# Patient Record
Sex: Male | Born: 1935 | Race: White | Hispanic: No | Smoking: Former smoker
Health system: Southern US, Community
[De-identification: ages and names within clinical notes are randomized; demographics above are authoritative.]

## PROBLEM LIST (undated history)

## (undated) DIAGNOSIS — J189 Pneumonia, unspecified organism: Secondary | ICD-10-CM

## (undated) DIAGNOSIS — I219 Acute myocardial infarction, unspecified: Secondary | ICD-10-CM

## (undated) DIAGNOSIS — I10 Essential (primary) hypertension: Secondary | ICD-10-CM

## (undated) DIAGNOSIS — K219 Gastro-esophageal reflux disease without esophagitis: Secondary | ICD-10-CM

## (undated) DIAGNOSIS — N189 Chronic kidney disease, unspecified: Secondary | ICD-10-CM

## (undated) DIAGNOSIS — C801 Malignant (primary) neoplasm, unspecified: Secondary | ICD-10-CM

## (undated) DIAGNOSIS — E785 Hyperlipidemia, unspecified: Secondary | ICD-10-CM

## (undated) DIAGNOSIS — I2510 Atherosclerotic heart disease of native coronary artery without angina pectoris: Secondary | ICD-10-CM

## (undated) DIAGNOSIS — Z9289 Personal history of other medical treatment: Secondary | ICD-10-CM

## (undated) DIAGNOSIS — R42 Dizziness and giddiness: Secondary | ICD-10-CM

## (undated) HISTORY — DX: Pneumonia, unspecified organism: J18.9

## (undated) HISTORY — PX: CORONARY STENT PLACEMENT: SHX1402

## (undated) HISTORY — DX: Dizziness and giddiness: R42

## (undated) HISTORY — DX: Personal history of other medical treatment: Z92.89

## (undated) HISTORY — DX: Hyperlipidemia, unspecified: E78.5

## (undated) HISTORY — DX: Malignant (primary) neoplasm, unspecified: C80.1

## (undated) HISTORY — DX: Essential (primary) hypertension: I10

## (undated) HISTORY — DX: Acute myocardial infarction, unspecified: I21.9

## (undated) HISTORY — DX: Gastro-esophageal reflux disease without esophagitis: K21.9

## (undated) HISTORY — DX: Chronic kidney disease, unspecified: N18.9

## (undated) HISTORY — DX: Atherosclerotic heart disease of native coronary artery without angina pectoris: I25.10

## (undated) HISTORY — PX: APPENDECTOMY: SHX54

---

## 2006-07-15 LAB — HM DIABETES EYE EXAM: HM Diabetic Eye Exam: NORMAL

## 2009-02-24 DIAGNOSIS — I219 Acute myocardial infarction, unspecified: Secondary | ICD-10-CM

## 2009-02-24 HISTORY — DX: Acute myocardial infarction, unspecified: I21.9

## 2010-02-19 ENCOUNTER — Ambulatory Visit: Payer: Self-pay | Admitting: Internal Medicine

## 2010-02-19 DIAGNOSIS — J4530 Mild persistent asthma, uncomplicated: Secondary | ICD-10-CM | POA: Insufficient documentation

## 2010-02-23 DIAGNOSIS — K219 Gastro-esophageal reflux disease without esophagitis: Secondary | ICD-10-CM | POA: Insufficient documentation

## 2010-02-23 DIAGNOSIS — I219 Acute myocardial infarction, unspecified: Secondary | ICD-10-CM | POA: Insufficient documentation

## 2010-02-23 DIAGNOSIS — I2510 Atherosclerotic heart disease of native coronary artery without angina pectoris: Secondary | ICD-10-CM | POA: Insufficient documentation

## 2010-03-28 NOTE — Assessment & Plan Note (Signed)
Summary: [name]   Vital Signs:  Patient profile:   77 year old male Weight:      189.50 pounds O2 Sat:      94 % on Room air Pulse rate:   66 / minute BP sitting:   130 / 72  (left arm) Cuff size:   regular  Vitals Entered By: Clayborne Dana CMA 03-09-2010 1:42 PM)  O2 Flow:  Room air   History of Present Illness: February 19, 2010- 81 yoM referred by daughter Manuel [name]- a patient here. He is moving from [place] and finds visiting in her current house, he gets tight in chest, with wheeze and can't sleep. He clears up when he leaves. He has hx of childhood asthma and was allergic to cat then, with seasonal hayfever. He had no problem visiting her in other [place] homes before she settled in her current local home. He will be moving into this home with her when they build an addition. House is not moldy, no animals, mattress ok, furnace 75 years old, hardwood, no carpet. Having little nasal congestion or sneeze. Review of meds negative for obvious problems.    Preventive Screening-Counseling & Management  Alcohol-Tobacco     Smoking Status: quit     Packs/Day: 0.75     Year Started: teenager     Year Quit: 1990  Current Medications (verified): 1)  Aspirin 81 Mg Tbec (Aspirin) .... Take 1 By Mouth Once Daily 2)  Lipitor 20 Mg Tabs (Atorvastatin Calcium) .... Take 1 By Mouth Once Daily 3)  Metoprolol Tartrate 25 Mg Tabs (Metoprolol Tartrate) .... Take 2 By Mouth Once Daily 4)  Nexium 40 Mg Cpdr (Esomeprazole Magnesium) .... Take 1 By Mouth Once Daily 5)  Plavix 75 Mg Tabs (Clopidogrel Bisulfate) .... Take 1 By Mouth Once Daily 6)  Metformin Hcl 500 Mg Tabs (Metformin Hcl) .... Take 2 By Mouth Two Times A Day 7)  Multivitamins  Tabs (Multiple Vitamin) .... Take 1 By Mouth Once Daily 8)  Super B Complex  Tabs (B Complex-C) .... Take 1 By Mouth Once Daily 9)  Vitamin C 500 Mg Tabs (Ascorbic Acid) .... Take 1 By Mouth Once Daily 10)  Vitamin D 400 Unit Tabs (Cholecalciferol)  .... Take 1 By Mouth Once Daily 11)  Vitamin E 400 Unit Caps (Vitamin E) .... Take 1 By Mouth Once Daily 12)  Fish Oil 1000 Mg Caps (Omega-3 Fatty Acids) .... Take 1 By Mouth Once Daily 13)  Calcium 600 Mg Tabs (Calcium) .... 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  Smoking Status:  quit Packs/Day:  0.75  Review of Systems      See HPI       The patient complains of productive cough and chest pain.  The patient denies shortness of breath with  activity, shortness of breath at rest, non-productive cough, coughing up blood, irregular heartbeats, acid heartburn, indigestion, loss of appetite, weight change, abdominal pain, difficulty swallowing, sore throat, tooth/dental problems, headaches, nasal congestion/difficulty breathing through nose, sneezing, itching, ear ache, anxiety, depression, hand/feet swelling, joint stiffness or pain, rash, change in color of mucus, and fever.    Physical Exam  Additional Exam:  General: A/Ox3; pleasant and cooperative, NAD, trim SKIN: no rash, lesions NODES: no lymphadenopathy HEENT: [place]/AT, EOM- WNL, Conjuctivae- clear, PERRLA, TM-WNL, Nose- old nasal fx, Throat- clear and wnl,  own teeth NECK: Supple w/ fair ROM, JVD- none, normal carotid impulses w/o bruits Thyroid- normal to palpation CHEST: Clear to P&A, quiet HEART: RRR, no m/g/r heard ABDOMEN: Soft and nl; nml bowel sounds; no organomegaly or masses noted DTO:IZTI, nl pulses, no edema, cyanosis or clubbing NEURO: Grossly intact to observation      Impression & Recommendations:  Problem # 1:  ASTHMA (ICD-493.90) It seems he is having asthma specifically associated with some aspect of his daughter's house.  He does admit a little wheeze occasionally in [place] and a child hx of episodic asthma. Other than metoprolol as a beta blocker, potentiating meds aren't evident. He feels his GERD is controlled. He doesn't plan permanent move here for another year. We will see if he notes benefit from rescue inhaler and LABA/ICS.  Problem # 2:  CORONARY HEART DISEASE (ICD-414.00) I don't think he is describing "cardiac asthma" but we will watch fluid status. His updated medication list for this problem includes:    Aspirin 81 Mg Tbec (Aspirin) .[name]... Take 1 by mouth once daily    Metoprolol Tartrate 25 Mg Tabs (Metoprolol tartrate) .[name]... Take 2 by mouth once daily    Plavix 75 Mg Tabs (Clopidogrel bisulfate) .[name]... Take 1 by mouth once daily  Problem # 3:  G E R D (ICD-530.81) We have discussed reflux as a respiratory trigger so he will watch effect of diet choices. His updated medication list for this problem includes:    Nexium 40 Mg Cpdr (Esomeprazole magnesium) .[name]... Take 1 by mouth once daily  Medications Added to Medication List This Visit: 1)  Aspirin 81 Mg Tbec (Aspirin) .... Take 1 by mouth once daily 2)  Lipitor 20 Mg Tabs (Atorvastatin calcium) .... Take 1 by mouth once daily 3)  Metoprolol Tartrate 25 Mg Tabs (Metoprolol tartrate) .... Take 2 by mouth once daily 4)  Nexium 40 Mg Cpdr (Esomeprazole magnesium) .... Take 1 by mouth once daily 5)  Plavix 75 Mg Tabs (Clopidogrel bisulfate) .... Take 1 by  mouth once daily 6)  Metformin Hcl 500 Mg Tabs (Metformin hcl) .... Take 2 by mouth two times a day 7)  Multivitamins Tabs (Multiple vitamin) .... Take 1 by mouth once daily 8)  Super B Complex Tabs (B complex-c) .... Take 1 by mouth once daily 9)  Vitamin C 500 Mg Tabs (Ascorbic acid) .... Take 1 by mouth once daily 10)  Vitamin D 400 Unit Tabs (Cholecalciferol) .... Take 1 by mouth once daily 11)  Vitamin E 400 Unit Caps (Vitamin e) .... Take 1 by mouth once daily 12)  Fish Oil 1000 Mg Caps (Omega-3 fatty acids) .... Take 1 by mouth once daily 13)  Calcium 600 Mg Tabs (Calcium) .... Take 1 by mouth once daily  Other Orders: New Patient Level IV (45809)  Patient Instructions: 1)  Please schedule a follow-up appointment as needed. 2)  Sample Symbicort 160/4.5 3)   2 puffs  and rinse mouth twice daily 4)  Sample Proair/ equivalent rescue bronchodilator inhaler 5)   2 puffs four times a day as needed for chest tightness/ wheezing 6)  Consider encasings for the bedding and maybe an aircleaner in the bedroom    Orders Added: 1)  New Patient Level IV [56720]

## 2010-06-19 ENCOUNTER — Encounter: Payer: Self-pay | Admitting: Cardiology

## 2010-06-20 ENCOUNTER — Encounter: Payer: Self-pay | Admitting: Cardiology

## 2010-06-20 ENCOUNTER — Ambulatory Visit (INDEPENDENT_AMBULATORY_CARE_PROVIDER_SITE_OTHER): Payer: Medicare Other | Admitting: Cardiology

## 2010-06-20 DIAGNOSIS — R5383 Other fatigue: Secondary | ICD-10-CM

## 2010-06-20 DIAGNOSIS — I219 Acute myocardial infarction, unspecified: Secondary | ICD-10-CM

## 2010-06-20 DIAGNOSIS — R5381 Other malaise: Secondary | ICD-10-CM

## 2010-06-20 DIAGNOSIS — E785 Hyperlipidemia, unspecified: Secondary | ICD-10-CM

## 2010-06-20 DIAGNOSIS — I2510 Atherosclerotic heart disease of native coronary artery without angina pectoris: Secondary | ICD-10-CM

## 2010-06-20 MED ORDER — RAMIPRIL 2.5 MG PO CAPS: 2.5000 mg | ORAL_CAPSULE | Freq: Every day | ORAL | Status: DC

## 2010-06-20 NOTE — Patient Instructions (Addendum)
You can stop Plavix when you run out of your current supply.  When you stop Plavix increase Aspirin to 114m daily. This will be two 828maspirin daily. This should be enteric coated.  Start Ramipril  2.4m79maily.  Return for fasting lab at the time of the appt with Dr JonRonnald Rampy 8--lipid profile/liver profile/TSH/BMP/ CBC 414.01  Your physician wants you to follow-up in: 6 months with Dr McLAundra Dubinctober 2012). You will receive a reminder letter in the mail two months in advance. If you don't receive a letter, please call our office to schedule the follow-up appointment.

## 2010-06-21 DIAGNOSIS — E785 Hyperlipidemia, unspecified: Secondary | ICD-10-CM | POA: Insufficient documentation

## 2010-06-21 NOTE — Assessment & Plan Note (Signed)
Stable with no ischemic symptoms.  > 1 year post MI with DES.  He wants to stop Plavix, which I think is reasonable.  He had 1 relatively short stent (Xience V).  He will increase ASA to 162 mg daily.  Continue metoprolol and statin.  I will add ramipril 2.5 mg daily for secondary prevention.  BMET in 10 days.

## 2010-06-21 NOTE — Assessment & Plan Note (Signed)
Patient is on Lipitor 20 mg daily.  Will check fasting lipids with goal LDL < 70.

## 2010-06-21 NOTE — Progress Notes (Signed)
77 yo with history of CAD s/p MI in 1/11, HTN, DM, allergic asthma, and hyperlipidemia presents to establish cardiology followup.  Patient developed bilateral shoulder pain in 1/11 and went to the ER, where he was found to have NSTEMI.  LHC showed 99% mid CFX stenosis treated with DES.  EF was preserved. SInce that time, he has done well.  He gets occasional chest pain that will last for 1-2 seconds and then resolve.  No exertional chest pain.  He golfs once a week and walks 1 mile several times a week with no chest pain or exertional dyspnea.  Mild dyspnea walking up a hill.  He is still taking Plavix and wants to know if he can stop it.   ECG: NSR, old inferior MI, nonspecific T wave flattening  PMH: 1. Asthma: Allergic component 2. CAD: s/p MI in 1/11.  LHC (1/11) with 99% mCFX treated with 2.5 x 16 Xience V DES; 50% mLAD; EF 55%.  3. GERD 4. Appendectomy 5. HTN 6. Hyperlipidemia 7. Diabetes mellitus type II  FH: Parents lived into their 63s.  No premature CAD.    SH: Moved recently from [place], [place] to [place].  Married, originally from [place].  Now living in daughter's house.  Quit smoking 1990, retired Hotel manager, occasional smoker.    ROS: All systems reviewed and negative except as per HPI.   Current Outpatient Prescriptions  Medication Sig Dispense Refill  . albuterol (PROVENTIL,VENTOLIN) 90 MCG/ACT inhaler Inhale 2 puffs into the lungs every 6 (six) hours as needed.        . Ascorbic Acid (VITAMIN C) 500 MG tablet Take 500 mg by mouth daily.        [name] atorvastatin (LIPITOR) 20 MG tablet Take 20 mg by mouth daily.        . B Complex-C (SUPER B COMPLEX PO) Take 1 tablet by mouth daily.        . calcium carbonate (OS-CAL) 600 MG TABS Take 600 mg by mouth daily.        [name] esomeprazole (NEXIUM) 40 MG capsule Take 40 mg by mouth daily before breakfast.        . fish oil-omega-3 fatty acids 1000 MG capsule Take 2 g by mouth daily.        . metFORMIN (GLUCOPHAGE) 500 MG tablet  Take 1,000 mg by mouth 2 (two) times daily with a meal.        . Multiple Vitamin (MULTIVITAMIN) tablet Take 1 tablet by mouth daily.        . vitamin D, CHOLECALCIFEROL, 400 UNITS tablet Take 400 Units by mouth daily.        . vitamin E 400 UNIT capsule Take 400 Units by mouth daily.        [name] aspirin EC 81 MG EC tablet Take 1 tablet (81 mg total) by mouth daily.      . metoprolol tartrate (LOPRESSOR) 25 MG tablet Take 1 tablet (25 mg total) by mouth.      . ramipril (ALTACE) 2.5 MG capsule Take 1 capsule (2.5 mg total) by mouth daily.  30 capsule  11    BP 118/70  Pulse 62  Ht 5' 10"  (1.778 m)  Wt 185 lb 1.9 oz (83.97 kg)  BMI 26.56 kg/m2 General: NAD Neck: No JVD, no thyromegaly or thyroid nodule.  Lungs: Clear to auscultation bilaterally with normal respiratory effort. CV: Nondisplaced PMI.  Heart regular S1/S2, no S3/S4, no murmur.  No peripheral edema.  No  carotid bruit.  Normal pedal pulses.  Abdomen: Soft, nontender, no hepatosplenomegaly, no distention.  Skin: Intact without lesions or rashes.  Neurologic: Alert and oriented x 3.  Psych: Normal affect. Extremities: No clubbing or cyanosis.  HEENT: Normal.

## 2010-07-01 ENCOUNTER — Encounter: Payer: Self-pay | Admitting: Internal Medicine

## 2010-07-02 ENCOUNTER — Other Ambulatory Visit: Payer: Medicare Other

## 2010-07-02 ENCOUNTER — Encounter: Payer: Self-pay | Admitting: Internal Medicine

## 2010-07-02 ENCOUNTER — Ambulatory Visit (INDEPENDENT_AMBULATORY_CARE_PROVIDER_SITE_OTHER): Payer: Medicare Other | Admitting: Internal Medicine

## 2010-07-02 ENCOUNTER — Other Ambulatory Visit (INDEPENDENT_AMBULATORY_CARE_PROVIDER_SITE_OTHER): Payer: Medicare Other

## 2010-07-02 ENCOUNTER — Ambulatory Visit (INDEPENDENT_AMBULATORY_CARE_PROVIDER_SITE_OTHER)
Admission: RE | Admit: 2010-07-02 | Discharge: 2010-07-02 | Disposition: A | Discharge status: Home / Self Care | Payer: Medicare Other | Source: Ambulatory Visit | Attending: Internal Medicine | Admitting: Internal Medicine

## 2010-07-02 VITALS — BP 118/64 | HR 100 | Temp 98.6°F | Resp 16 | Ht 70.0 in | Wt 185.0 lb

## 2010-07-02 DIAGNOSIS — R059 Cough, unspecified: Secondary | ICD-10-CM

## 2010-07-02 DIAGNOSIS — R05 Cough: Secondary | ICD-10-CM

## 2010-07-02 DIAGNOSIS — R5381 Other malaise: Secondary | ICD-10-CM

## 2010-07-02 DIAGNOSIS — J309 Allergic rhinitis, unspecified: Secondary | ICD-10-CM

## 2010-07-02 DIAGNOSIS — R5383 Other fatigue: Secondary | ICD-10-CM

## 2010-07-02 DIAGNOSIS — IMO0001 Reserved for inherently not codable concepts without codable children: Secondary | ICD-10-CM

## 2010-07-02 DIAGNOSIS — J209 Acute bronchitis, unspecified: Secondary | ICD-10-CM

## 2010-07-02 DIAGNOSIS — I2510 Atherosclerotic heart disease of native coronary artery without angina pectoris: Secondary | ICD-10-CM

## 2010-07-02 LAB — URINALYSIS, ROUTINE W REFLEX MICROSCOPIC
Bilirubin Urine: NEGATIVE
Hgb urine dipstick: NEGATIVE
Ketones, ur: NEGATIVE
Leukocytes, UA: NEGATIVE
Specific Gravity, Urine: 1.01 (ref 1.000–1.030)
Urine Glucose: NEGATIVE
Urobilinogen, UA: 0.2 (ref 0.0–1.0)

## 2010-07-02 LAB — BASIC METABOLIC PANEL
BUN: 16 mg/dL (ref 6–23)
Chloride: 95 mEq/L — ABNORMAL LOW (ref 96–112)
Glucose, Bld: 111 mg/dL — ABNORMAL HIGH (ref 70–99)
Potassium: 4.6 mEq/L (ref 3.5–5.1)

## 2010-07-02 LAB — CBC WITH DIFFERENTIAL/PLATELET
Basophils Absolute: 0.1 10*3/uL (ref 0.0–0.1)
Eosinophils Relative: 4.5 % (ref 0.0–5.0)
HCT: 41.8 % (ref 39.0–52.0)
Lymphs Abs: 1.4 10*3/uL (ref 0.7–4.0)
MCV: 93.5 fl (ref 78.0–100.0)
Monocytes Absolute: 1.4 10*3/uL — ABNORMAL HIGH (ref 0.1–1.0)
Neutro Abs: 5.7 10*3/uL (ref 1.4–7.7)
Platelets: 178 10*3/uL (ref 150.0–400.0)
RDW: 14.8 % — ABNORMAL HIGH (ref 11.5–14.6)

## 2010-07-02 LAB — LIPID PANEL
Cholesterol: 164 mg/dL (ref 0–200)
LDL Cholesterol: 103 mg/dL — ABNORMAL HIGH (ref 0–99)

## 2010-07-02 LAB — HEPATIC FUNCTION PANEL
Alkaline Phosphatase: 63 U/L (ref 39–117)
Bilirubin, Direct: 0.2 mg/dL (ref 0.0–0.3)
Total Bilirubin: 1.1 mg/dL (ref 0.3–1.2)
Total Protein: 7.3 g/dL (ref 6.0–8.3)

## 2010-07-02 LAB — HEMOGLOBIN A1C: Hgb A1c MFr Bld: 6.3 % (ref 4.6–6.5)

## 2010-07-02 MED ORDER — METHYLPREDNISOLONE ACETATE 80 MG/ML IJ SUSP
120.0000 mg | Freq: Once | INTRAMUSCULAR | Status: AC
  Administered 2010-07-02: 120 mg via INTRAMUSCULAR

## 2010-07-02 MED ORDER — CEFUROXIME AXETIL 500 MG PO TABS: 500.0000 mg | ORAL_TABLET | Freq: Two times a day (BID) | ORAL | Status: AC

## 2010-07-02 MED ORDER — GUAIFENESIN-CODEINE 100-10 MG/5ML PO SYRP: 5.0000 mL | ORAL_SOLUTION | Freq: Three times a day (TID) | ORAL | Status: AC | PRN

## 2010-07-02 MED ORDER — ALBUTEROL 90 MCG/ACT IN AERS: 2.0000 | INHALATION_SPRAY | Freq: Four times a day (QID) | RESPIRATORY_TRACT | Status: DC | PRN

## 2010-07-02 NOTE — Assessment & Plan Note (Signed)
Depo-medrol IM to reduce the allergic response and symptoms

## 2010-07-02 NOTE — Progress Notes (Signed)
Subjective:    Patient ID: Manuel [name], male    DOB: June 04, 1928, 77 y.o.   MRN: [id number]  URI  This is a new problem. The current episode started in the past 7 days. The problem has been gradually worsening. There has been no fever. Associated symptoms include congestion, coughing, rhinorrhea, sinus pain, sneezing and a sore throat. Pertinent negatives include no abdominal pain, chest pain, diarrhea, dysuria, ear pain, headaches, joint pain, joint swelling, nausea, neck pain, plugged ear sensation, rash, swollen glands, vomiting or wheezing. He has tried nothing for the symptoms.  Diabetes He presents for his follow-up diabetic visit. He has type 2 diabetes mellitus. His disease course has been improving. Pertinent negatives for hypoglycemia include no confusion, dizziness, headaches, hunger, mood changes, nervousness/anxiousness, pallor, seizures, sleepiness, speech difficulty, sweats or tremors. Pertinent negatives for diabetes include no blurred vision, no chest pain, no fatigue, no foot paresthesias, no foot ulcerations, no polydipsia, no polyphagia, no polyuria, no visual change, no weakness and no weight loss. There are no hypoglycemic complications. Symptoms are improving. There are no diabetic complications. Current diabetic treatment includes oral agent (monotherapy). He is compliant with treatment all of the time. His weight is stable. He is following a generally healthy diet. Meal planning includes avoidance of concentrated sweets. He has had a previous visit with a dietician. He participates in exercise intermittently. There is no change in his home blood glucose trend. His breakfast blood glucose range is generally 70-90 mg/dl. His lunch blood glucose range is generally 70-90 mg/dl. His dinner blood glucose range is generally 90-110 mg/dl. His highest blood glucose is 90-110 mg/dl. His overall blood glucose range is 70-90 mg/dl. An ACE inhibitor/angiotensin II receptor blocker is being taken.       Review of Systems  Constitutional: Negative for fever, chills, weight loss, diaphoresis, activity change, appetite change, fatigue and unexpected weight change.  HENT: Positive for congestion, sore throat, rhinorrhea, sneezing, postnasal drip and sinus pressure. Negative for hearing loss, ear pain, nosebleeds, facial swelling, mouth sores, trouble swallowing, neck pain, neck stiffness, voice change, tinnitus and ear discharge.   Eyes: Negative for blurred vision, photophobia and visual disturbance.  Respiratory: Positive for cough. Negative for apnea, choking, chest tightness, shortness of breath, wheezing and stridor.   Cardiovascular: Negative for chest pain, palpitations and leg swelling.  Gastrointestinal: Negative for nausea, vomiting, abdominal pain, diarrhea and constipation.  Genitourinary: Negative for dysuria, urgency, polyuria, frequency, hematuria, decreased urine volume, enuresis and difficulty urinating.  Musculoskeletal: Negative for myalgias, back pain, joint pain, joint swelling, arthralgias and gait problem.  Skin: Negative for pallor and rash.  Neurological: Negative for dizziness, tremors, seizures, syncope, facial asymmetry, speech difficulty, weakness, light-headedness, numbness and headaches.  Hematological: Negative for polydipsia, polyphagia and adenopathy. Does not bruise/bleed easily.  Psychiatric/Behavioral: Negative for behavioral problems, confusion and agitation. The patient is not nervous/anxious.        Objective:   Physical Exam  [vitalsreviewed. Constitutional: He is oriented to person, place, and time. He appears well-developed and well-nourished. No distress.  HENT:  Head: Normocephalic and atraumatic. No trismus in the jaw.  Right Ear: Hearing, tympanic membrane, external ear and ear canal normal.  Left Ear: Hearing, tympanic membrane, external ear and ear canal normal.  Nose: Mucosal edema and rhinorrhea present. No sinus tenderness, nasal  deformity, septal deviation or nasal septal hematoma. No epistaxis.  No foreign bodies. Right sinus exhibits no maxillary sinus tenderness and no frontal sinus tenderness. Left sinus exhibits no maxillary sinus  tenderness and no frontal sinus tenderness.  Mouth/Throat: Uvula is midline, oropharynx is clear and moist and mucous membranes are normal. Mucous membranes are not pale, not dry and not cyanotic. No oropharyngeal exudate, posterior oropharyngeal edema, posterior oropharyngeal erythema or tonsillar abscesses.  Eyes: Conjunctivae and EOM are normal. Pupils are equal, round, and reactive to light. Right eye exhibits no discharge. Left eye exhibits no discharge. No scleral icterus.  Neck: Normal range of motion. Neck supple. No JVD present. No tracheal deviation present. No thyromegaly present.  Cardiovascular: Normal rate, regular rhythm, normal heart sounds and intact distal pulses.  Exam reveals no gallop and no friction rub.   No murmur heard. Pulmonary/Chest: Effort normal and breath sounds normal. No stridor. No respiratory distress. He has no wheezes. He has no rales. He exhibits no tenderness.  Abdominal: Soft. Bowel sounds are normal. He exhibits no distension and no mass. There is no tenderness. There is no rebound and no guarding.  Musculoskeletal: Normal range of motion. He exhibits no edema and no tenderness.  Lymphadenopathy:    He has no cervical adenopathy.  Neurological: He is alert and oriented to person, place, and time. He has normal reflexes. He displays normal reflexes. He exhibits normal muscle tone. Coordination normal.  Skin: Skin is warm and dry. No rash noted. He is not diaphoretic. No erythema. No pallor.  Psychiatric: He has a normal mood and affect. His behavior is normal. Judgment and thought content normal.          Assessment & Plan:

## 2010-07-02 NOTE — Assessment & Plan Note (Signed)
Check a cxr for pna, edema, masses, etc 

## 2010-07-02 NOTE — Assessment & Plan Note (Signed)
I will check his A1C today 

## 2010-07-02 NOTE — Patient Instructions (Signed)
Diabetes, Type 2 Diabetes is a lasting (chronic) disease. In type 2 diabetes, the pancreas does not make enough insulin (a hormone), and the body does not respond normally to the insulin that is made. This type of diabetes was also previously called adult onset diabetes. About 90% of all those who have diabetes have type 2. It usually occurs after the age of 32 but can occur at any age. CAUSES Unlike type 1 diabetes, which happens because insulin is no longer being made, type 2 diabetes happens because the body is making less insulin and has trouble using the insulin properly. SYMPTOMS  Drinking more than usual.   Urinating more than usual.   Blurred vision.   Dry, itchy skin.   Frequent infection like yeast infections in women.   More tired than usual (fatigue).  TREATMENT  Healthy eating.   Exercise.   Medication, if needed.   Monitoring blood glucose (sugar).   Seeing your caregiver regularly.  HOME CARE INSTRUCTIONS  Check your blood glucose (sugar) at least once daily. More frequent monitoring may be necessary, depending on your medications and on how well your diabetes is controlled. Your caregiver will advise you.   Take your medicine as directed by your caregiver.   Do not smoke.   Make wise food choices. Ask your caregiver for information. Weight loss can improve your diabetes.   Learn about low blood glucose (hypoglycemia) and how to treat it.   Get your eyes checked regularly.   Have a yearly physical exam. Have your blood pressure checked. Get your blood and urine tested.   Wear a pendant or bracelet saying that you have diabetes.   Check your feet every night for sores. Let your caregiver know if you have sores that are not healing.  SEEK MEDICAL CARE IF:  You are having problems keeping your blood glucose at target range.   You feel you might be having problems with your medicines.   You have symptoms of an illness that is not improving after 24  hours.   You have a sore or wound that is not healing.   You notice a change in vision or a new problem with your vision.  You develop a fever of more than 100.5Bronchitis Bronchitis is the body's way of reacting to injury and/or infection (inflammation) of the bronchi. Bronchi are the air tubes that extend from the windpipe into the lungs. If the inflammation becomes severe, it may cause shortness of breath.  CAUSES Inflammation may be caused by: A virus.  Germs (bacteria).  Dust.  Allergens.  Pollutants and many other irritants.  The cells lining the bronchial tree are covered with tiny hairs (cilia). These constantly beat upward, away from the lungs, toward the mouth. This keeps the lungs free of pollutants. When these cells become too irritated and are unable to do their job, mucus begins to develop. This causes the characteristic cough of bronchitis. The cough clears the lungs when the cilia are unable to do their job. Without either of these protective mechanisms, the mucus would settle in the lungs. Then you would develop pneumonia. Smoking is a common cause of bronchitis and can contribute to pneumonia. Stopping this habit is the single most important thing you can do to help yourself. TREATMENT Your caregiver may prescribe an antibiotic if the cough is caused by bacteria. Also, medicines that open up your airways make it easier to breathe. Your caregiver may also recommend or prescribe an expectorant. It will loosen the  mucus to be coughed up. Only take over-the-counter or prescription medicines for pain, discomfort, or fever as directed by your caregiver.  Removing whatever causes the problem (smoking, for example) is critical to preventing the problem from getting worse.  Cough suppressants may be prescribed for relief of cough symptoms.  Inhaled medicines may be prescribed to help with symptoms now and to help prevent problems from returning.  For those with recurrent (chronic)  bronchitis, there may be a need for steroid medicines.  SEEK IMMEDIATE MEDICAL CARE IF: During treatment, you develop more pus-like mucus (purulent sputum).  You or your child has an oral temperature above 100.5, not controlled by medicine.  Your baby is older than 3 months with a rectal temperature of 102 F (38.9 C) or higher.  Your baby is 86 months old or younger with a rectal temperature of 100.4 F (38 C) or higher.  You become progressively more ill.  You have increased difficulty breathing, wheezing, or shortness of breath.  It is necessary to seek immediate medical care if you are elderly or sick from any other disease. MAKE SURE YOU: Understand these instructions.  Will watch your condition.  Will get help right away if you are not doing well or get worse.  Document Released: 02/10/2005 Document Re-Released: 05/07/2009  [facility] Patient Information 2011 [place]..  Document Released: 02/10/2005 Document Re-Released: 03/04/2009 [facility] Patient Information 2011 [place].

## 2010-07-02 NOTE — Assessment & Plan Note (Signed)
Start ceftin and robitussin-AC

## 2010-07-03 ENCOUNTER — Telehealth: Payer: Self-pay | Admitting: *Deleted

## 2010-07-03 DIAGNOSIS — E785 Hyperlipidemia, unspecified: Secondary | ICD-10-CM

## 2010-07-03 DIAGNOSIS — I2510 Atherosclerotic heart disease of native coronary artery without angina pectoris: Secondary | ICD-10-CM

## 2010-07-03 MED ORDER — ATORVASTATIN CALCIUM 40 MG PO TABS
40.0000 mg | ORAL_TABLET | Freq: Every day | ORAL | Status: DC
Start: 2010-07-03 — End: 2010-11-21

## 2010-07-03 NOTE — Telephone Encounter (Signed)
Notes Recorded by Calvary Difranco M Willena Jeancharles, RN on 07/03/2010 at 3:41 PM I discussed results with pt. He agreed to increase Lipitor to 40mg daily. He will return for fasting L/L 08/27/10. Notes Recorded by Dalton McLean, MD on 07/02/2010 at 7:11 PM Increase Lipitor to 40 for goal LDL < 70.   

## 2010-07-05 ENCOUNTER — Telehealth: Payer: Self-pay | Admitting: Cardiology

## 2010-07-05 ENCOUNTER — Telehealth: Payer: Self-pay | Admitting: *Deleted

## 2010-07-05 NOTE — Telephone Encounter (Signed)
X-ray reveals COPD, calcified pleural plaques/scarring. No acute changes. Needs to discuss implications and plan with Dr. Ronnald Ramp.

## 2010-07-05 NOTE — Telephone Encounter (Signed)
Left mess to call office back.   

## 2010-07-05 NOTE — Telephone Encounter (Signed)
Daughter is req results of xray from Tuesday.

## 2010-07-05 NOTE — Telephone Encounter (Signed)
I talked with daughter. She called about lab results pt received from Dr Ronnald Ramp.

## 2010-07-05 NOTE — Telephone Encounter (Signed)
Lab results. Per daughter don't be alarm when calling back when you heard [employer].

## 2010-07-08 NOTE — Telephone Encounter (Signed)
Pt's daughter advised.  

## 2010-07-11 ENCOUNTER — Telehealth: Payer: Self-pay | Admitting: Cardiology

## 2010-07-11 NOTE — Telephone Encounter (Signed)
Records received from [facility] gave to [facility] 07/11/10/km

## 2010-07-12 ENCOUNTER — Telehealth: Payer: Self-pay | Admitting: Internal Medicine

## 2010-07-12 ENCOUNTER — Ambulatory Visit (INDEPENDENT_AMBULATORY_CARE_PROVIDER_SITE_OTHER): Payer: Medicare Other | Admitting: Internal Medicine

## 2010-07-12 VITALS — BP 118/72 | HR 72 | Temp 97.0°F | Wt 178.0 lb

## 2010-07-12 DIAGNOSIS — J069 Acute upper respiratory infection, unspecified: Secondary | ICD-10-CM

## 2010-07-12 MED ORDER — BENZONATATE 100 MG PO CAPS: 100.0000 mg | ORAL_CAPSULE | Freq: Three times a day (TID) | ORAL | Status: DC | PRN

## 2010-07-12 MED ORDER — AZITHROMYCIN 500 MG PO TABS: 500.0000 mg | ORAL_TABLET | Freq: Every day | ORAL | Status: AC

## 2010-07-12 NOTE — Patient Instructions (Signed)
Upper respiratory infection - no flagrant evidence of bacterial infection but strongly possible. Also there is probably a significant allergy component. Plan - tri-pak (antibiotic) once a day for three days; robitussin DM or the generic equivalent; tessalon perl 182m three times a day for the tickle cough; take otc claritin (loratadine 126monce a day) and if this doesn't seem to control allergy symptoms may try nasal steroid spray, e.g  Flonase, etc.

## 2010-07-12 NOTE — Telephone Encounter (Signed)
Forwarded to Dr. Ronnald Ramp for review.

## 2010-07-14 NOTE — Progress Notes (Signed)
  Subjective:    Patient ID: Manuel [name], male    DOB: 1928-08-01, 77 y.o.   MRN: [id number]  HPI Mr. Dada, an 77 y/o man, presents for follow-up of persistent cough. He was seen May 8th by Dr. Ronnald Ramp or cough. He received depomedrol injection and was instructed to use Robitussin DM or equivalent. He had a CXR completed which was negative for any active disease. He did initially improve but has had recurrent symptoms: constant cough that is intermittently productive, sinus congestion, sore throat and increased fatigue. He has tried otc cold and flu meds. He denies fever but has awakended cold and clammy. He does have h/o allergies.   PMH, FamHx and SocHx reviewed for any changes and relevance.    Review of Systems Review of Systems  Constitutional:  Negative for fever, chills, activity change and unexpected weight change.  HENT:  Negative for hearing loss, ear pain, neck stiffness and postnasal drip. Positive for sinus congestion   Eyes: Negative for pain, discharge and visual disturbance.  Respiratory: Negative for chest tightness and wheezing. Positive for coughing   Cardiovascular: Negative for chest pain and palpitations.       [No decreased exercise tolerance Gastrointestinal: [No change in bowel habit. No bloating or gas. No reflux or indigestion Genitourinary: Negative for urgency, frequency, flank pain and difficulty urinating.  Musculoskeletal: Negative for myalgias, back pain, arthralgias and gait problem.  Neurological: Negative for dizziness, tremors, weakness and headaches.  Hematological: Negative for adenopathy.  Psychiatric/Behavioral: Negative for behavioral problems and dysphoric mood.       Objective:   Physical Exam Vitals - reviewed HEENT - EACs, TMs normal. Mild erythema posterior pharynx. Neck -supple Chest - CTAP Cor - RRR       Assessment & Plan:  URI - Persistent symptoms, possibly infectious.  Plan - Tri-pak as directed            Tessalon perles and  robitussin DM            Claritin for allergic swymptoms            For unrelieved post-nasal drainage and sinus congestion will consider nasal steroid spray.

## 2010-08-05 ENCOUNTER — Encounter: Payer: Self-pay | Admitting: Cardiology

## 2010-08-12 ENCOUNTER — Encounter: Payer: Self-pay | Admitting: Cardiology

## 2010-08-27 ENCOUNTER — Other Ambulatory Visit (INDEPENDENT_AMBULATORY_CARE_PROVIDER_SITE_OTHER): Payer: Medicare Other

## 2010-08-27 ENCOUNTER — Other Ambulatory Visit: Payer: Self-pay | Admitting: Internal Medicine

## 2010-08-27 DIAGNOSIS — I2510 Atherosclerotic heart disease of native coronary artery without angina pectoris: Secondary | ICD-10-CM

## 2010-08-27 DIAGNOSIS — E785 Hyperlipidemia, unspecified: Secondary | ICD-10-CM

## 2010-08-27 LAB — HEPATIC FUNCTION PANEL
ALT: 24 U/L (ref 0–53)
Total Bilirubin: 0.6 mg/dL (ref 0.3–1.2)

## 2010-08-27 LAB — LIPID PANEL
HDL: 49.8 mg/dL (ref >39.00)
LDL Cholesterol: 71 mg/dL (ref 0–99)
Total CHOL/HDL Ratio: 3
VLDL: 22.4 mg/dL (ref 0.0–40.0)

## 2010-08-29 ENCOUNTER — Encounter: Payer: Self-pay | Admitting: *Deleted

## 2010-09-16 ENCOUNTER — Telehealth: Payer: Self-pay | Admitting: Cardiology

## 2010-09-16 NOTE — Telephone Encounter (Signed)
Left a message to call back.

## 2010-09-16 NOTE — Telephone Encounter (Signed)
Pt daughter called because pt has a pain in his chest that he told his daughter about and she was wondering if he needed to come in earlier before next appt. She works from home for [employer]

## 2010-09-17 NOTE — Telephone Encounter (Signed)
I talked with pt. Pt states he had a sharp pain in his chest Saturday morning that lasted seconds. Pt states he played golf on Friday without any problems.  Pt states he has felt good since Saturday morning. He has not had any more chest pain. Pt states prior to stent placement he had more of a shoulder pain and the pain he had Saturday was completely different than the chest pain prior to stent placement. Pt denies any other symptoms, including SOB or nausea. Pt advised to call if any change in symptoms.

## 2010-09-21 ENCOUNTER — Encounter: Payer: Self-pay | Admitting: Family Medicine

## 2010-09-21 ENCOUNTER — Ambulatory Visit (INDEPENDENT_AMBULATORY_CARE_PROVIDER_SITE_OTHER): Payer: Medicare Other | Admitting: Family Medicine

## 2010-09-21 VITALS — BP 122/80 | HR 61 | Wt 184.0 lb

## 2010-09-21 DIAGNOSIS — R229 Localized swelling, mass and lump, unspecified: Secondary | ICD-10-CM

## 2010-09-21 DIAGNOSIS — R2230 Localized swelling, mass and lump, unspecified upper limb: Secondary | ICD-10-CM

## 2010-09-21 NOTE — Patient Instructions (Signed)
If the area gets bigger or more tender, then notify your regular doctor.  Take care.

## 2010-09-21 NOTE — Progress Notes (Signed)
Pt stopped plavix this week per cards instructions.   Knot on R arm.  Present about 1 week. It 'got bigger, then smaller, then stayed about the same".  No sx like this prev on arm, but h/o sebaceous cysts.  Never was red, no drainage.  Felling well o/w.  No FCNAV.  It's tender with pressure, but not tender o/w.  Meds, vitals, and allergies reviewed.   ROS: See HPI.  Otherwise, noncontributory.  nad ncat r arm with normal rom at shoulder/elbow/wrist Skin wnl except for 3x5.5 cm R forearm mass that is minimally tender.  Distally nv intact

## 2010-09-21 NOTE — Assessment & Plan Note (Addendum)
Possible sebaceous cyst.  Unlikely to be lipoma given the history.  Not erythematous and smaller per report.  No indication for I&D.  F/u prn.  He agrees.

## 2010-11-04 ENCOUNTER — Other Ambulatory Visit: Payer: Self-pay | Admitting: Cardiology

## 2010-11-05 NOTE — Telephone Encounter (Signed)
Pt needs 90 day supply per pt insurance requirements. Please call in RX refill.

## 2010-11-06 MED ORDER — RAMIPRIL 2.5 MG PO CAPS: 2.5000 mg | ORAL_CAPSULE | Freq: Every day | ORAL | Status: DC

## 2010-11-21 ENCOUNTER — Encounter: Payer: Self-pay | Admitting: Physician Assistant

## 2010-11-21 ENCOUNTER — Ambulatory Visit (INDEPENDENT_AMBULATORY_CARE_PROVIDER_SITE_OTHER): Payer: Medicare Other | Admitting: Physician Assistant

## 2010-11-21 VITALS — BP 127/76 | HR 70 | Resp 18 | Ht 70.0 in | Wt 187.0 lb

## 2010-11-21 DIAGNOSIS — R5381 Other malaise: Secondary | ICD-10-CM

## 2010-11-21 DIAGNOSIS — R5383 Other fatigue: Secondary | ICD-10-CM

## 2010-11-21 DIAGNOSIS — I2510 Atherosclerotic heart disease of native coronary artery without angina pectoris: Secondary | ICD-10-CM

## 2010-11-21 DIAGNOSIS — E785 Hyperlipidemia, unspecified: Secondary | ICD-10-CM

## 2010-11-21 DIAGNOSIS — E7800 Pure hypercholesterolemia, unspecified: Secondary | ICD-10-CM

## 2010-11-21 DIAGNOSIS — I10 Essential (primary) hypertension: Secondary | ICD-10-CM | POA: Insufficient documentation

## 2010-11-21 DIAGNOSIS — R42 Dizziness and giddiness: Secondary | ICD-10-CM | POA: Insufficient documentation

## 2010-11-21 MED ORDER — METOPROLOL TARTRATE 25 MG PO TABS: ORAL_TABLET | ORAL | Status: DC

## 2010-11-21 MED ORDER — ATORVASTATIN CALCIUM 40 MG PO TABS: 40.0000 mg | ORAL_TABLET | Freq: Every day | ORAL | Status: DC

## 2010-11-21 NOTE — Assessment & Plan Note (Signed)
Controlled.  

## 2010-11-21 NOTE — Assessment & Plan Note (Signed)
Etiology not clear.  HR 60.  Question if he is symptomatic from bradycardia.  Not orthostatic on VS today.  Will decrease metoprolol to 25 mg 1/2 BID.  Always active when dizzy.  Question anginal equivalent.  Angina with NSTEMI was shoulder pain.  Will set him up for ETT-myoview to look for chronotropic competence and r/o ischemia.  Also get 48 hour Holter to screen for significant bradycardia or pauses.  Check BMET, TSH, CBC.  Follow up in 2-3 weeks.  If cardiac workup negative, consider vestibular causes.

## 2010-11-21 NOTE — Assessment & Plan Note (Signed)
Continue atorvastatin

## 2010-11-21 NOTE — Assessment & Plan Note (Signed)
Continue ASA and statin.  Check myoview.  Follow up in 2-3 weeks.

## 2010-11-21 NOTE — Progress Notes (Signed)
Agree with myoview, monitor, and followup with me.  Sheryll Dymek [employer]

## 2010-11-21 NOTE — Progress Notes (Addendum)
History of Present Illness: Primary Cardiologist:  Dr. Marca Ancona PCP:  Dr. Sanda Linger  Manuel [name] is a 77 y.o. male with a history of CAD s/p MI in 1/11, HTN, DM, allergic asthma, and hyperlipidemia.  Patient developed bilateral shoulder pain in 1/11 and went to the ER in [place] (where he lived at the time), where he was found to have NSTEMI.  LHC showed 99% mid CFX stenosis treated with DES.  EF was preserved.  Saw Dr. Shirlee Latch to establish in 4/12. Plavix was stopped at that time.  Ramipril was added to meds.   Now presents with dizziness.  Last week, was painting walls and developed dizziness.  Had to sit down to feel better.  Described as lightheadedness.  Felt like he might fall. Not sure if he was near syncopal.  Lasted a few minutes.  Has continued to have recurrent episodes since.  Always occur while active.  Has felt more tired.  No chest or shoulder pain.  No syncope.  No significant dyspnea.  Probably class 2-2b.  No orthopnea, PND or edema.  No weight changes.  No palpitations.     Labs (5/12):  K 4.6, creatinine 0.8 Labs (7/12):  TC 143, TG 112, HDL 49.8, LDL 71, LFTs ok  Past Medical History: 1. Asthma: Allergic component 2. CAD: s/p MI in 1/11.  LHC (1/11) with 99% mCFX treated with 2.5 x 16 Xience V DES; 50% mLAD; EF 55%.  3. GERD 4. Appendectomy 5. HTN 6. Hyperlipidemia 7. Diabetes mellitus type II 8. BCC skin cancer   Current Outpatient Prescriptions  Medication Sig Dispense Refill  . albuterol (PROVENTIL,VENTOLIN) 90 MCG/ACT inhaler Inhale 2 puffs into the lungs every 6 (six) hours as needed.  17 g  11  . aspirin EC 81 MG EC tablet Take 1 tablet (81 mg total) by mouth daily.      . benzonatate (TESSALON PERLES) 100 MG capsule Take 1 capsule (100 mg total) by mouth 3 (three) times daily as needed for cough.  30 capsule  0  . dronedarone (MULTAQ) 400 MG tablet Take 400 mg by mouth daily.        [name] esomeprazole (NEXIUM) 40 MG capsule Take 40 mg by mouth daily before  breakfast.        . fish oil-omega-3 fatty acids 1000 MG capsule Take 2 g by mouth daily.        . metFORMIN (GLUCOPHAGE) 500 MG tablet Take 1,000 mg by mouth 2 (two) times daily with a meal.        . metoprolol tartrate (LOPRESSOR) 25 MG tablet Take 25 mg by mouth 2 (two) times daily.       . ramipril (ALTACE) 2.5 MG capsule Take 1 capsule (2.5 mg total) by mouth daily.  90 capsule  3    Allergies: No Known Allergies  FH: Parents lived into their 33s.  No premature CAD.    SH: Moved recently from [place], [place] to [place].  Married, originally from [place].  Now living in daughter's house.  Quit smoking 1990, retired Medical illustrator, occasional smoker.    ROS:  Please see the history of present illness.  Has a non productive cough.  Otherwise, all other systems reviewed and negative.   Vital Signs: BP 128/73  Pulse 66  Resp 18  Ht 5\' 10"  (1.778 m)  Wt 187 lb (84.823 kg)  BMI 26.83 kg/m2 Repeat by me: L 110/70;  R 120/68  Filed Vitals:   11/21/10 1444  11/21/10 1445 11/21/10 1446 11/21/10 1449  BP: 105/60 112/67 128/73 127/76  Pulse: 60 69 66 70  Resp:      Height:      Weight:         PHYSICAL EXAM: Well nourished, well developed, in no acute distress HEENT: normal Neck: no JVD Vascular: no carotid or subclavian bruits bilaterally Cardiac:  normal S1, S2; RRR; no murmur Lungs:  clear to auscultation bilaterally, no wheezing, rhonchi or rales Abd: soft, nontender, no hepatomegaly Ext: no edema Skin: warm and dry Neuro:  CNs 2-12 intact, no focal abnormalities noted Psych: normal affect  EKG:  NSR, HR 60, NSSTTW changes  ASSESSMENT AND PLAN:

## 2010-11-21 NOTE — Patient Instructions (Addendum)
Your physician recommends that you schedule a follow-up appointment in: 2 to 3  Dr. Shirlee Latch weeks. Your physician has requested that you have en exercise stress myoview. For further information please visit [email or web address] Please follow instruction sheet, as given. Your physician has recommended that you wear a holter monitor. Holter monitors are medical devices that record the heart's electrical activity. Doctors most often use these monitors to diagnose arrhythmias. Arrhythmias are problems with the speed or rhythm of the heartbeat. The monitor is a small, portable device. You can wear one while you do your normal daily activities. This is usually used to diagnose what is causing palpitations/syncope (passing out). 48 hours. Your physician recommends that you return for lab work in: today: BMET,CBC,TSH. Patient to decrease Metoprolol 25 mg to 1/2 tablet twice a day.  New Medication: Atorvastatin 40 mg take one tablet by mouth daily.

## 2010-11-22 LAB — BASIC METABOLIC PANEL
BUN: 21 mg/dL (ref 6–23)
CO2: 32 mEq/L (ref 19–32)
Calcium: 9.6 mg/dL (ref 8.4–10.5)
Chloride: 103 mEq/L (ref 96–112)
Creatinine, Ser: 0.8 mg/dL (ref 0.4–1.5)
Glucose, Bld: 107 mg/dL — ABNORMAL HIGH (ref 70–99)

## 2010-11-22 LAB — CBC WITH DIFFERENTIAL/PLATELET
Eosinophils Absolute: 0.3 10*3/uL (ref 0.0–0.7)
Eosinophils Relative: 5.5 % — ABNORMAL HIGH (ref 0.0–5.0)
HCT: 41.1 % (ref 39.0–52.0)
Lymphs Abs: 1.3 10*3/uL (ref 0.7–4.0)
MCHC: 33.2 g/dL (ref 30.0–36.0)
MCV: 94.4 fl (ref 78.0–100.0)
Monocytes Absolute: 0.6 10*3/uL (ref 0.1–1.0)
Neutrophils Relative %: 60.1 % (ref 43.0–77.0)
Platelets: 178 10*3/uL (ref 150.0–400.0)
RDW: 14.1 % (ref 11.5–14.6)
WBC: 5.7 10*3/uL (ref 4.5–10.5)

## 2010-11-22 NOTE — Progress Notes (Signed)
Addended by: Early Chars on: 11/22/2010 03:25 PM   Modules accepted: Orders

## 2010-11-25 ENCOUNTER — Telehealth: Payer: Self-pay | Admitting: *Deleted

## 2010-11-25 ENCOUNTER — Ambulatory Visit (INDEPENDENT_AMBULATORY_CARE_PROVIDER_SITE_OTHER): Payer: Medicare Other | Admitting: *Deleted

## 2010-11-25 DIAGNOSIS — I2510 Atherosclerotic heart disease of native coronary artery without angina pectoris: Secondary | ICD-10-CM

## 2010-11-25 DIAGNOSIS — E785 Hyperlipidemia, unspecified: Secondary | ICD-10-CM

## 2010-11-25 DIAGNOSIS — I219 Acute myocardial infarction, unspecified: Secondary | ICD-10-CM

## 2010-11-25 LAB — BASIC METABOLIC PANEL
CO2: 28 mEq/L (ref 19–32)
Calcium: 8.9 mg/dL (ref 8.4–10.5)
Creatinine, Ser: 0.8 mg/dL (ref 0.4–1.5)
Glucose, Bld: 97 mg/dL (ref 70–99)

## 2010-11-25 NOTE — Telephone Encounter (Signed)
Pt aware.

## 2010-11-26 ENCOUNTER — Encounter: Payer: Self-pay | Admitting: *Deleted

## 2010-12-02 ENCOUNTER — Encounter (HOSPITAL_COMMUNITY): Payer: Medicare Other | Admitting: Radiology

## 2010-12-05 ENCOUNTER — Ambulatory Visit (HOSPITAL_COMMUNITY): Payer: Medicare Other | Attending: Cardiology | Admitting: Radiology

## 2010-12-05 ENCOUNTER — Ambulatory Visit: Payer: Medicare Other | Admitting: Cardiology

## 2010-12-05 ENCOUNTER — Encounter (INDEPENDENT_AMBULATORY_CARE_PROVIDER_SITE_OTHER): Payer: Medicare Other

## 2010-12-05 DIAGNOSIS — I2510 Atherosclerotic heart disease of native coronary artery without angina pectoris: Secondary | ICD-10-CM | POA: Insufficient documentation

## 2010-12-05 DIAGNOSIS — R0602 Shortness of breath: Secondary | ICD-10-CM

## 2010-12-05 DIAGNOSIS — R42 Dizziness and giddiness: Secondary | ICD-10-CM | POA: Insufficient documentation

## 2010-12-05 DIAGNOSIS — I4949 Other premature depolarization: Secondary | ICD-10-CM

## 2010-12-05 MED ORDER — TECHNETIUM TC 99M TETROFOSMIN IV KIT
11.0000 | PACK | Freq: Once | INTRAVENOUS | Status: AC | PRN
  Administered 2010-12-05: 11 via INTRAVENOUS

## 2010-12-05 MED ORDER — TECHNETIUM TC 99M TETROFOSMIN IV KIT
33.0000 | PACK | Freq: Once | INTRAVENOUS | Status: AC | PRN
  Administered 2010-12-05: 33 via INTRAVENOUS

## 2010-12-05 NOTE — Progress Notes (Signed)
[facility] [place] [address] [phone]  Cardiology Nuclear Med Study  Manuel [name] is a 77 y.o. male [id number] Feb 15, 1929   Nuclear Med Background Indication for Stress Test:  Evaluation for Ischemia and Stent Patency, and Assess Chronotropic Competence History:  Asthma, COPD,1/11 Echo:normal per patient,01 Heart Catheterization:EF= 55%& 99% CFX obstruction,01 Myocardial Infarction, Myocardial Perfusion Study 20 yrs ago and normal per patient and 01Stents:CFX Cardiac Risk Factors: Family History - CAD, History of Smoking, Hypertension, Lipids and NIDDM  Symptoms:  Chest Pain:last episode 3 mos ago, Dizziness with exertion, DOE, Fatigue and Light-Headedness   Nuclear Pre-Procedure Caffeine/Decaff Intake:  None NPO After: 6:00pm   Lungs:  Expiratory wheezing;patient performed 2 puffs of his Albuterol inhaler.  Lungs clear post inhaler. IV 0.9% NS with Angio Cath:  20g  IV Site: R Hand x 1, tolerated well IV Started by:  Irven [place], RN  Chest Size (in):  46 Cup Size: n/a  Height: 5' 10"  (1.778 m)  Weight:  182 lb (82.555 kg)  BMI:  Body mass index is 26.11 kg/(m^2). Tech Comments:  Held metoprolol x 36 hrs    Nuclear Med Study 1 or 2 day study: 1 day  Stress Test Type:  Stress  Reading MD: Kirk Ruths, MD  Order Authorizing Provider:  Richardson Dopp, PAC,Dalton Aundra Dubin, MD  Resting Radionuclide: Technetium 62mTetrofosmin  Resting Radionuclide Dose: 11.0 mCi   Stress Radionuclide:  Technetium 975metrofosmin  Stress Radionuclide Dose: 33.0 mCi           Stress Protocol Rest HR: 66 Stress HR: 148  Rest BP: 114/84 Stress BP: 135/92  Exercise Time (min): 4:51 METS: 5.8   Predicted Max HR: 140 bpm % Max HR: 104.29 bpm Rate Pressure Product: 21608   Dose of Adenosine (mg):  n/a Dose of Lexiscan: n/a mg  Dose of Atropine (mg): n/a Dose of Dobutamine: n/a mcg/kg/min (at max HR)  Stress Test Technologist: CyMatilde HaymakerRN  Nuclear Technologist:  StCharlton AmorCNMT     Rest Procedure:  Myocardial perfusion imaging was performed at rest 45 minutes following the intravenous administration of Technetium 9974mtrofosmin. Rest ECG: NSR with frequent PVC's  Stress Procedure:  The patient exercised for 4:51.  The patient stopped due to fatigue and moderate SOB and denied any chest pain.  There were no significant ST-T wave changes. Patient had frequent PVC's and couplets in recovery. Technetium 55m57mrofosmin was injected at peak exercise and myocardial perfusion imaging was performed after a brief delay. Stress ECG: No significant ST segment change suggestive of ischemia.  QPS Raw Data Images:  Acquisition technically good; normal left ventricular size. Stress Images:  Normal homogeneous uptake in all areas of the myocardium. Rest Images:  Normal homogeneous uptake in all areas of the myocardium. Subtraction (SDS):  No evidence of ischemia. Transient Ischemic Dilatation (Normal <1.22):  0.97 Lung/Heart Ratio (Normal <0.45):  0.26  Quantitative Gated Spect Images QGS EDV:  91 ml QGS ESV:  40 ml QGS cine images:  NL LV Function; NL Wall Motion QGS EF:56%  Impression Exercise Capacity:  Fair exercise capacity. BP Response:  Normal blood pressure response. Clinical Symptoms:  No chest pain. ECG Impression:  No significant ST segment change suggestive of ischemia. Comparison with Prior Nuclear Study: No images to compare  Overall Impression:  Normal stress nuclear study.   [name]

## 2010-12-12 ENCOUNTER — Encounter: Payer: Self-pay | Admitting: Cardiology

## 2010-12-12 ENCOUNTER — Ambulatory Visit (INDEPENDENT_AMBULATORY_CARE_PROVIDER_SITE_OTHER): Payer: Medicare Other | Admitting: Cardiology

## 2010-12-12 DIAGNOSIS — I2510 Atherosclerotic heart disease of native coronary artery without angina pectoris: Secondary | ICD-10-CM

## 2010-12-12 DIAGNOSIS — E785 Hyperlipidemia, unspecified: Secondary | ICD-10-CM

## 2010-12-12 DIAGNOSIS — R42 Dizziness and giddiness: Secondary | ICD-10-CM

## 2010-12-12 NOTE — Assessment & Plan Note (Signed)
Recent myoview with no evidence for ischemia or infarction.  Continue ASA 81, statin, metoprolol, and ACEI.   Continue regular exercise.

## 2010-12-12 NOTE — Progress Notes (Signed)
PCP: Dr. Ronnald Ramp  77 yo with history of CAD s/p MI in 1/11, HTN, DM, allergic asthma, and hyperlipidemia presents for cardiology followup.  Patient developed bilateral shoulder pain in 1/11 and went to the ER, where he was found to have NSTEMI.  LHC showed 99% mid CFX stenosis treated with DES.  EF was preserved.   He was seen by Richardson Dopp in 9/12 with lightheaded spells.  Holter monitor was done, showing no significant bradycardia but frequent PACs and PVCs.  ETT-myoview was also done, showing no sign of ischemia or infarction.  Metoprolol was decreased to 12.5 mg bid due to concern for possible orthostasis.  Since that time, his lightheaded spells have mostly resolved.  He will still get mildly lightheaded if he stands up too fast from a seated position.  He continues to play 18 holes of golf once a week.  No chest pain or exertional dyspnea.   Labs (7/12): LDL 71, HDL 50, K 5, creatinine 0.8  PMH: 1. Asthma: Allergic component 2. CAD: s/p MI in 1/11.  LHC (1/11) with 99% mCFX treated with 2.5 x 16 Xience V DES; 50% mLAD; EF 55%.  ETT-myoview (10/12): 4'51", no significant ST segment changes, EF 56%, no ischemia or infarction.  3. GERD 4. Appendectomy 5. HTN 6. Hyperlipidemia 7. Diabetes mellitus type II 8. Lightheaded spells: Holter (10/12): Frequent PACs and PVCs.  No significant bradycardia.    FH: Parents lived into their 18s.  No premature CAD.    SH: Moved recently from [place], [place] to [place].  Married, originally from [place].  Now living in daughter's house.  Quit smoking 1990, retired Hotel manager, occasional smoker.     Current Outpatient Prescriptions  Medication Sig Dispense Refill  . albuterol (PROVENTIL,VENTOLIN) 90 MCG/ACT inhaler Inhale 2 puffs into the lungs every 6 (six) hours as needed.  17 g  11  . aspirin EC 81 MG EC tablet Take 1 tablet (81 mg total) by mouth daily.      [name] atorvastatin (LIPITOR) 40 MG tablet Take 1 tablet (40 mg total) by mouth daily.  30  tablet  11  . benzonatate (TESSALON PERLES) 100 MG capsule Take 1 capsule (100 mg total) by mouth 3 (three) times daily as needed for cough.  30 capsule  0  . esomeprazole (NEXIUM) 40 MG capsule Take 40 mg by mouth daily before breakfast.        . fish oil-omega-3 fatty acids 1000 MG capsule Take 1 g by mouth daily.       . metFORMIN (GLUCOPHAGE) 500 MG tablet Take 1,000 mg by mouth 2 (two) times daily with a meal.        . metoprolol tartrate (LOPRESSOR) 25 MG tablet Take 12.5 mg by mouth 2 (two) times daily. Take 1/2 tablet daily.       . ramipril (ALTACE) 2.5 MG capsule Take 1 capsule (2.5 mg total) by mouth daily.  90 capsule  3  . DISCONTD: metoprolol tartrate (LOPRESSOR) 25 MG tablet Take 1/2 tablet daily.  30 tablet  8    BP 132/70  Pulse 72  Ht 5' 10"  (1.778 m)  Wt 186 lb (84.369 kg)  BMI 26.69 kg/m2 General: NAD Neck: No JVD, no thyromegaly or thyroid nodule.  Lungs: Clear to auscultation bilaterally with normal respiratory effort. CV: Nondisplaced PMI.  Heart regular S1/S2, no S3/S4, no murmur.  No peripheral edema.  No carotid bruit.  Normal pedal pulses.  Abdomen: Soft, nontender, no hepatosplenomegaly, no distention.  Neurologic: Alert and oriented x 3.  Psych: Normal affect. Extremities: No clubbing or cyanosis.

## 2010-12-12 NOTE — Assessment & Plan Note (Signed)
Patient had lightheaded spells that seemed more orthostatic in nature than vertigo.  He feels better with decreased metoprolol.  Holter monitor from 10/12 showed no abnormal findings that could explain lightheadedness.

## 2010-12-12 NOTE — Patient Instructions (Signed)
Your physician wants you to follow-up in: 6 months with Dr Aundra Dubin. (April 2013). You will receive a reminder letter in the mail two months in advance. If you don't receive a letter, please call our office to schedule the follow-up appointment.

## 2010-12-12 NOTE — Assessment & Plan Note (Signed)
LDL is at goal (< 70).

## 2010-12-24 ENCOUNTER — Other Ambulatory Visit: Payer: Self-pay | Admitting: Physician Assistant

## 2011-03-19 ENCOUNTER — Ambulatory Visit (INDEPENDENT_AMBULATORY_CARE_PROVIDER_SITE_OTHER): Payer: Medicare Other | Admitting: Internal Medicine

## 2011-03-19 ENCOUNTER — Other Ambulatory Visit (INDEPENDENT_AMBULATORY_CARE_PROVIDER_SITE_OTHER): Payer: Medicare Other

## 2011-03-19 ENCOUNTER — Encounter: Payer: Self-pay | Admitting: Internal Medicine

## 2011-03-19 DIAGNOSIS — R059 Cough, unspecified: Secondary | ICD-10-CM

## 2011-03-19 DIAGNOSIS — G5790 Unspecified mononeuropathy of unspecified lower limb: Secondary | ICD-10-CM

## 2011-03-19 DIAGNOSIS — I10 Essential (primary) hypertension: Secondary | ICD-10-CM

## 2011-03-19 DIAGNOSIS — R05 Cough: Secondary | ICD-10-CM

## 2011-03-19 DIAGNOSIS — G629 Polyneuropathy, unspecified: Secondary | ICD-10-CM

## 2011-03-19 DIAGNOSIS — E785 Hyperlipidemia, unspecified: Secondary | ICD-10-CM | POA: Diagnosis not present

## 2011-03-19 DIAGNOSIS — E1142 Type 2 diabetes mellitus with diabetic polyneuropathy: Secondary | ICD-10-CM

## 2011-03-19 DIAGNOSIS — G609 Hereditary and idiopathic neuropathy, unspecified: Secondary | ICD-10-CM

## 2011-03-19 DIAGNOSIS — E1149 Type 2 diabetes mellitus with other diabetic neurological complication: Secondary | ICD-10-CM

## 2011-03-19 DIAGNOSIS — B351 Tinea unguium: Secondary | ICD-10-CM | POA: Insufficient documentation

## 2011-03-19 DIAGNOSIS — B353 Tinea pedis: Secondary | ICD-10-CM

## 2011-03-19 DIAGNOSIS — IMO0001 Reserved for inherently not codable concepts without codable children: Secondary | ICD-10-CM

## 2011-03-19 LAB — FOLATE: Folate: >24.8 ng/mL (ref >5.9)

## 2011-03-19 LAB — COMPREHENSIVE METABOLIC PANEL
ALT: 30 U/L (ref 0–53)
AST: 34 U/L (ref 0–37)
Albumin: 4.1 g/dL (ref 3.5–5.2)
CO2: 31 mEq/L (ref 19–32)
Calcium: 9.6 mg/dL (ref 8.4–10.5)
Creatinine, Ser: 0.9 mg/dL (ref 0.4–1.5)
GFR: 87.22 mL/min (ref >60.00)
Glucose, Bld: 203 mg/dL — ABNORMAL HIGH (ref 70–99)
Sodium: 137 mEq/L (ref 135–145)
Total Bilirubin: 0.8 mg/dL (ref 0.3–1.2)
Total Protein: 7.5 g/dL (ref 6.0–8.3)

## 2011-03-19 LAB — LIPID PANEL: Cholesterol: 138 mg/dL (ref 0–200)

## 2011-03-19 LAB — CBC WITH DIFFERENTIAL/PLATELET
Basophils Relative: 1.2 % (ref 0.0–3.0)
Eosinophils Absolute: 0.5 10*3/uL (ref 0.0–0.7)
Hemoglobin: 14.8 g/dL (ref 13.0–17.0)
Lymphocytes Relative: 23 % (ref 12.0–46.0)
MCHC: 33.8 g/dL (ref 30.0–36.0)
MCV: 94.5 fl (ref 78.0–100.0)
Monocytes Absolute: 0.5 10*3/uL (ref 0.1–1.0)
Monocytes Relative: 10.6 % (ref 3.0–12.0)
Neutro Abs: 2.8 10*3/uL (ref 1.4–7.7)
RBC: 4.62 Mil/uL (ref 4.22–5.81)

## 2011-03-19 LAB — URINALYSIS, ROUTINE W REFLEX MICROSCOPIC
Bilirubin Urine: NEGATIVE
Hgb urine dipstick: NEGATIVE
Ketones, ur: NEGATIVE
Urine Glucose: 250
Urobilinogen, UA: 1 (ref 0.0–1.0)

## 2011-03-19 MED ORDER — CICLOPIROX OLAMINE 0.77 % EX SUSP: 1.0000 | Freq: Two times a day (BID) | CUTANEOUS | Status: DC

## 2011-03-19 MED ORDER — AZILSARTAN MEDOXOMIL 80 MG PO TABS
1.0000 | ORAL_TABLET | Freq: Every day | ORAL | Status: DC
Start: 2011-03-19 — End: 2011-05-20

## 2011-03-19 MED ORDER — L-METHYLFOLATE-B6-B12 3-35-2 MG PO TABS: 1.0000 | ORAL_TABLET | Freq: Every day | ORAL | Status: DC

## 2011-03-19 NOTE — Progress Notes (Signed)
Subjective:    Patient ID: Manuel [name], male    DOB: 03/29/1928, 78 y.o.   MRN: [id number]  Diabetes He presents for his follow-up diabetic visit. He has type 2 diabetes mellitus. His disease course has been stable. There are no hypoglycemic associated symptoms. Pertinent negatives for hypoglycemia include no dizziness, headaches, pallor, seizures, speech difficulty, sweats or tremors. Associated symptoms include foot paresthesias. Pertinent negatives for diabetes include no blurred vision, no chest pain, no fatigue, no foot ulcerations, no polydipsia, no polyphagia, no polyuria, no visual change, no weakness and no weight loss. There are no hypoglycemic complications. Symptoms are worsening. Symptoms have been present for 6 months. Diabetic complications include peripheral neuropathy. Current diabetic treatment includes oral agent (monotherapy). He is compliant with treatment all of the time. His weight is stable. He is following a generally healthy diet. Meal planning includes avoidance of concentrated sweets. He has not had a previous visit with a dietician. He participates in exercise intermittently. There is no change in his home blood glucose trend. An ACE inhibitor/angiotensin II receptor blocker is being taken. He does not see a podiatrist.Eye exam is current.  Hypertension This is a chronic problem. The current episode started more than 1 year ago. The problem is unchanged. The problem is controlled. Pertinent negatives include no anxiety, blurred vision, chest pain, headaches, malaise/fatigue, neck pain, orthopnea, palpitations, peripheral edema, PND, shortness of breath or sweats. There are no associated agents to hypertension. Past treatments include ACE inhibitors and beta blockers. The current treatment provides moderate improvement. Compliance problems include exercise and diet.  Hypertensive end-organ damage includes CAD/MI.      Review of Systems  Constitutional: Negative for fever,  chills, weight loss, malaise/fatigue, diaphoresis, activity change, appetite change, fatigue and unexpected weight change.  HENT: Negative for sore throat, facial swelling, trouble swallowing, neck pain and voice change.   Eyes: Negative.  Negative for blurred vision.  Respiratory: Positive for cough (chronic, NP cough). Negative for apnea, choking, chest tightness, shortness of breath, wheezing and stridor.   Cardiovascular: Negative for chest pain, palpitations, orthopnea, leg swelling and PND.  Gastrointestinal: Negative for nausea, vomiting, abdominal pain, diarrhea, constipation, blood in stool, abdominal distention, anal bleeding and rectal pain.  Genitourinary: Negative for dysuria, urgency, polyuria, hematuria, flank pain, decreased urine volume, enuresis and difficulty urinating.  Musculoskeletal: Negative for myalgias, back pain, joint swelling, arthralgias and gait problem.  Skin: Positive for rash (scaling and ithcing on both feet). Negative for color change, pallor and wound.  Neurological: Positive for numbness (in both feet, R>L). Negative for dizziness, tremors, seizures, syncope, facial asymmetry, speech difficulty, weakness, light-headedness and headaches.  Hematological: Negative for polydipsia, polyphagia and adenopathy. Does not bruise/bleed easily.  Psychiatric/Behavioral: Negative.        Objective:   Physical Exam  Vitals reviewed. Constitutional: He appears well-developed and well-nourished. No distress.  HENT:  Head: Normocephalic and atraumatic.  Mouth/Throat: Oropharynx is clear and moist. No oropharyngeal exudate.  Eyes: Conjunctivae are normal. Right eye exhibits no discharge. Left eye exhibits no discharge. No scleral icterus.  Neck: Normal range of motion. Neck supple. No JVD present. No tracheal deviation present. No thyromegaly present.  Cardiovascular: Normal rate, regular rhythm, normal heart sounds and intact distal pulses.  Exam reveals no gallop and no  friction rub.   No murmur heard. Pulses:      Carotid pulses are 1+ on the right side, and 1+ on the left side.      Radial pulses are 1+  on the right side, and 1+ on the left side.       Femoral pulses are 1+ on the right side, and 1+ on the left side.      Popliteal pulses are 1+ on the right side, and 1+ on the left side.       Dorsalis pedis pulses are 1+ on the right side, and 1+ on the left side.       Posterior tibial pulses are 1+ on the right side, and 1+ on the left side.  Pulmonary/Chest: Effort normal and breath sounds normal. No stridor. No respiratory distress. He has no wheezes. He has no rales. He exhibits no tenderness.  Abdominal: Soft. Bowel sounds are normal. He exhibits no distension and no mass. There is no tenderness. There is no rebound and no guarding.  Musculoskeletal: Normal range of motion. He exhibits edema (trace edema in both legs). He exhibits no tenderness.  Lymphadenopathy:    He has no cervical adenopathy.  Neurological: He is alert. He has normal strength. He displays abnormal reflex. He displays no atrophy and no tremor. A sensory deficit is present. No cranial nerve deficit. He exhibits normal muscle tone. He displays a negative Romberg sign. He displays no seizure activity. Coordination and gait normal.  Reflex Scores:      Tricep reflexes are 1+ on the right side and 1+ on the left side.      Bicep reflexes are 1+ on the right side and 1+ on the left side.      Brachioradialis reflexes are 1+ on the right side and 1+ on the left side.      Patellar reflexes are 0 on the right side and 0 on the left side.      Achilles reflexes are 0 on the right side and 0 on the left side.      Slight decreased sensation in both feet  Skin: Skin is warm, dry and intact. Rash (scaling and erythema on both feet) noted. No abrasion, no bruising, no burn, no ecchymosis, no laceration, no lesion, no petechiae and no purpura noted. Rash is not macular, not papular, not  maculopapular, not pustular, not vesicular and not urticarial. He is not diaphoretic. No cyanosis or erythema. No pallor. Nails show no clubbing.       He has thick toenails with subung debris and lysis  Psychiatric: He has a normal mood and affect. His behavior is normal. Judgment and thought content normal.      Lab Results  Component Value Date   WBC 5.7 11/22/2010   HGB 13.6 11/22/2010   HCT 41.1 11/22/2010   PLT 178.0 11/22/2010   GLUCOSE 97 11/25/2010   CHOL 143 08/27/2010   TRIG 112.0 08/27/2010   HDL 49.80 08/27/2010   LDLCALC 71 08/27/2010   ALT 24 08/27/2010   AST 28 08/27/2010   NA 140 11/25/2010   K 5.0 11/25/2010   CL 102 11/25/2010   CREATININE 0.8 11/25/2010   BUN 25* 11/25/2010   CO2 28 11/25/2010   TSH 1.11 11/21/2010   HGBA1C 6.3 07/02/2010      Assessment & Plan:

## 2011-03-19 NOTE — Patient Instructions (Signed)

## 2011-03-19 NOTE — Assessment & Plan Note (Signed)
i think the ACEI has caused this so it was topped today

## 2011-03-19 NOTE — Assessment & Plan Note (Signed)
a1c today, check renal function

## 2011-03-19 NOTE — Assessment & Plan Note (Signed)
Start loprox and ask him to see a podiatrist for further eval and treatment

## 2011-03-19 NOTE — Assessment & Plan Note (Signed)
I think this is caused by DM II, I will check labs to look for other causes

## 2011-03-19 NOTE — Assessment & Plan Note (Signed)
He is doing well on lipitor, check his FLP today

## 2011-03-19 NOTE — Assessment & Plan Note (Signed)
Start metanx for the burning and numbness, check a1c and renal function today

## 2011-03-19 NOTE — Assessment & Plan Note (Signed)
Start loprox

## 2011-03-19 NOTE — Assessment & Plan Note (Signed)
Stop altace and start edarbi for BP control and risk reduction

## 2011-03-19 NOTE — Assessment & Plan Note (Signed)
Start metanx, check labs for secondary causes, may need to get a NCS/EMG done

## 2011-03-21 LAB — PROTEIN ELECTROPHORESIS, SERUM
Alpha-2-Globulin: 13.1 % — ABNORMAL HIGH (ref 7.1–11.8)
Beta 2: 5.4 % (ref 3.2–6.5)
Beta Globulin: 5.9 % (ref 4.7–7.2)
Gamma Globulin: 15.1 % (ref 11.1–18.8)

## 2011-03-25 ENCOUNTER — Telehealth: Payer: Self-pay

## 2011-03-25 NOTE — Telephone Encounter (Signed)
Daughter notified/[facility]

## 2011-03-25 NOTE — Telephone Encounter (Signed)
otc high dose B vitamin complex

## 2011-03-25 NOTE — Telephone Encounter (Signed)
Dr. April Holding (daughter) called [facility] requesting that MD suggest an alternative for Metanx. She states that it is not covered by insurance due to not being FDA approved. Please advise something different Thanks

## 2011-05-20 ENCOUNTER — Telehealth: Payer: Self-pay | Admitting: Internal Medicine

## 2011-05-20 DIAGNOSIS — E1149 Type 2 diabetes mellitus with other diabetic neurological complication: Secondary | ICD-10-CM

## 2011-05-20 DIAGNOSIS — I10 Essential (primary) hypertension: Secondary | ICD-10-CM

## 2011-05-20 MED ORDER — AZILSARTAN MEDOXOMIL 80 MG PO TABS: 1.0000 | ORAL_TABLET | Freq: Every day | ORAL | Status: DC

## 2011-05-20 NOTE — Telephone Encounter (Signed)
done

## 2011-05-20 NOTE — Telephone Encounter (Signed)
PT HAD SAMPLES OF EDARBI 80 MG.  HE IS ALMOST OUT.  HE WOULD LIKE MORE SAMPLES OR AN RX SENT TO [facility] ON [address] [place]. [phone]).  HE MADE AN APPT FOR April 9.

## 2011-05-22 NOTE — Telephone Encounter (Signed)
PATIENT IS AWARE.  HAS AN APPT April 2.

## 2011-05-27 ENCOUNTER — Encounter: Payer: Self-pay | Admitting: Internal Medicine

## 2011-05-27 ENCOUNTER — Ambulatory Visit (INDEPENDENT_AMBULATORY_CARE_PROVIDER_SITE_OTHER): Payer: Medicare Other | Admitting: Internal Medicine

## 2011-05-27 ENCOUNTER — Telehealth: Payer: Self-pay

## 2011-05-27 VITALS — BP 120/68 | HR 76 | Temp 97.9°F | Resp 20 | Wt 189.4 lb

## 2011-05-27 DIAGNOSIS — E1142 Type 2 diabetes mellitus with diabetic polyneuropathy: Secondary | ICD-10-CM | POA: Diagnosis not present

## 2011-05-27 DIAGNOSIS — E1149 Type 2 diabetes mellitus with other diabetic neurological complication: Secondary | ICD-10-CM

## 2011-05-27 DIAGNOSIS — Z23 Encounter for immunization: Secondary | ICD-10-CM | POA: Diagnosis not present

## 2011-05-27 DIAGNOSIS — I10 Essential (primary) hypertension: Secondary | ICD-10-CM

## 2011-05-27 DIAGNOSIS — E785 Hyperlipidemia, unspecified: Secondary | ICD-10-CM

## 2011-05-27 DIAGNOSIS — K529 Noninfective gastroenteritis and colitis, unspecified: Secondary | ICD-10-CM

## 2011-05-27 DIAGNOSIS — G5790 Unspecified mononeuropathy of unspecified lower limb: Secondary | ICD-10-CM

## 2011-05-27 DIAGNOSIS — IMO0001 Reserved for inherently not codable concepts without codable children: Secondary | ICD-10-CM | POA: Diagnosis not present

## 2011-05-27 DIAGNOSIS — K5289 Other specified noninfective gastroenteritis and colitis: Secondary | ICD-10-CM | POA: Diagnosis not present

## 2011-05-27 MED ORDER — PROMETHAZINE HCL 25 MG/ML IJ SOLN
12.5000 mg | Freq: Once | INTRAMUSCULAR | Status: AC
  Administered 2011-05-27: 12.5 mg via INTRAVENOUS

## 2011-05-27 MED ORDER — PROMETHAZINE HCL 12.5 MG PO TABS: 12.5000 mg | ORAL_TABLET | Freq: Four times a day (QID) | ORAL | Status: DC | PRN

## 2011-05-27 NOTE — Assessment & Plan Note (Signed)
I will check his a1c and will monitor his renal function today

## 2011-05-27 NOTE — Patient Instructions (Signed)
Diabetes, Type 2 Diabetes is a long-lasting (chronic) disease. In type 2 diabetes, the pancreas does not make enough insulin (a hormone), and the body does not respond normally to the insulin that is made. This type of diabetes was also previously called adult-onset diabetes. It usually occurs after the age of 67, but it can occur at any age.  CAUSES  Type 2 diabetes happens because the pancreasis not making enough insulin or your body has trouble using the insulin that your pancreas does make properly. SYMPTOMS   Drinking more than usual.   Urinating more than usual.   Blurred vision.   Dry, itchy skin.   Frequent infections.   Feeling more tired than usual (fatigue).  DIAGNOSIS The diagnosis of type 2 diabetes is usually made by one of the following tests:  Fasting blood glucose test. You will not eat for at least 8 hours and then take a blood test.   Random blood glucose test. Your blood glucose (sugar) is checked at any time of the day regardless of when you ate.   Oral glucose tolerance test (OGTT). Your blood glucose is measured after you have not eaten (fasted) and then after you drink a glucose containing beverage.  TREATMENT   Healthy eating.   Exercise.   Medicine, if needed.   Monitoring blood glucose.   Seeing your caregiver regularly.  HOME CARE INSTRUCTIONS   Check your blood glucose at least once a day. More frequent monitoring may be necessary, depending on your medicines and on how well your diabetes is controlled. Your caregiver will advise you.   Take your medicine as directed by your caregiver.   Do not smoke.   Make wise food choices. Ask your caregiver for information. Weight loss can improve your diabetes.   Learn about low blood glucose (hypoglycemia) and how to treat it.   Get your eyes checked regularly.   Have a yearly physical exam. Have your blood pressure checked and your blood and urine tested.   Wear a pendant or bracelet saying  that you have diabetes.   Check your feet every night for cuts, sores, blisters, and redness. Let your caregiver know if you have any problems.  SEEK MEDICAL CARE IF:   You have problems keeping your blood glucose in target range.   You have problems with your medicines.   You have symptoms of an illness that do not improve after 24 hours.   You have a sore or wound that is not healing.   You notice a change in vision or a new problem with your vision.   You have a fever.  MAKE SURE YOU:  Understand these instructions.   Will watch your condition.   Will get help right away if you are not doing well or get worse.  Document Released: 02/10/2005 Document Revised: 01/30/2011 Document Reviewed: 07/29/2010 [facility] Patient Information 2012 [place].Viral Gastroenteritis Viral gastroenteritis is also known as stomach flu. This condition affects the stomach and intestinal tract. It can cause sudden diarrhea and vomiting. The illness typically lasts 3 to 8 days. Most people develop an immune response that eventually gets rid of the virus. While this natural response develops, the virus can make you quite ill. CAUSES  Many different viruses can cause gastroenteritis, such as rotavirus or noroviruses. You can catch one of these viruses by consuming contaminated food or water. You may also catch a virus by sharing utensils or other personal items with an infected person or by touching a  contaminated surface. SYMPTOMS  The most common symptoms are diarrhea and vomiting. These problems can cause a severe loss of body fluids (dehydration) and a body salt (electrolyte) imbalance. Other symptoms may include:  Fever.   Headache.   Fatigue.   Abdominal pain.  DIAGNOSIS  Your caregiver can usually diagnose viral gastroenteritis based on your symptoms and a physical exam. A stool sample may also be taken to test for the presence of viruses or other infections. TREATMENT  This illness  typically goes away on its own. Treatments are aimed at rehydration. The most serious cases of viral gastroenteritis involve vomiting so severely that you are not able to keep fluids down. In these cases, fluids must be given through an intravenous line (IV). HOME CARE INSTRUCTIONS   Drink enough fluids to keep your urine clear or pale yellow. Drink small amounts of fluids frequently and increase the amounts as tolerated.   Ask your caregiver for specific rehydration instructions.   Avoid:   Foods high in sugar.   Alcohol.   Carbonated drinks.   Tobacco.   Juice.   Caffeine drinks.   Extremely hot or cold fluids.   Fatty, greasy foods.   Too much intake of anything at one time.   Dairy products until 24 to 48 hours after diarrhea stops.   You may consume probiotics. Probiotics are active cultures of beneficial bacteria. They may lessen the amount and number of diarrheal stools in adults. Probiotics can be found in yogurt with active cultures and in supplements.   Wash your hands well to avoid spreading the virus.   Only take over-the-counter or prescription medicines for pain, discomfort, or fever as directed by your caregiver. Do not give aspirin to children. Antidiarrheal medicines are not recommended.   Ask your caregiver if you should continue to take your regular prescribed and over-the-counter medicines.   Keep all follow-up appointments as directed by your caregiver.  SEEK IMMEDIATE MEDICAL CARE IF:   You are unable to keep fluids down.   You do not urinate at least once every 6 to 8 hours.   You develop shortness of breath.   You notice blood in your stool or vomit. This may look like coffee grounds.   You have abdominal pain that increases or is concentrated in one small area (localized).   You have persistent vomiting or diarrhea.   You have a fever.   The patient is a child younger than 3 months, and he or she has a fever.   The patient is a child  older than 3 months, and he or she has a fever and persistent symptoms.   The patient is a child older than 3 months, and he or she has a fever and symptoms suddenly get worse.   The patient is a baby, and he or she has no tears when crying.  MAKE SURE YOU:   Understand these instructions.   Will watch your condition.   Will get help right away if you are not doing well or get worse.  Document Released: 02/10/2005 Document Revised: 01/30/2011 Document Reviewed: 11/27/2010 [facility] Patient Information 2012 [place].

## 2011-05-27 NOTE — Assessment & Plan Note (Signed)
He has s/s of acute viral gastroenteritis, he received an injection of phenergan in the office for symptom relief, he will have labs done today to look for dehydration, abnormal lytes, anemia, elevated WBC and will continue to use phenergan as needed

## 2011-05-27 NOTE — Assessment & Plan Note (Signed)
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

## 2011-05-27 NOTE — Progress Notes (Signed)
Subjective:    Patient ID: Manuel [name], male    DOB: [birth year], 78 y.o.   MRN: [id number]  Diarrhea  This is a new problem. The current episode started today. The problem occurs less than 2 times per day. The problem has been unchanged. The stool consistency is described as watery. Associated symptoms include arthralgias (right foot pain) and vomiting (one time this morning). Pertinent negatives include no abdominal pain, bloating, chills, coughing, fever, headaches, increased  flatus, myalgias, sweats, URI or weight loss. Risk factors include ill contacts (his wife has had a "stomach flu"). He has tried nothing for the symptoms.      Review of Systems  Constitutional: Negative for fever, chills, weight loss, diaphoresis, activity change, appetite change, fatigue and unexpected weight change.  HENT: Negative.   Eyes: Negative.   Respiratory: Negative for apnea, cough, choking, chest tightness, shortness of breath, wheezing and stridor.   Cardiovascular: Negative for chest pain, palpitations and leg swelling.  Gastrointestinal: Positive for nausea, vomiting (one time this morning) and diarrhea. Negative for abdominal pain, constipation, blood in stool, abdominal distention, anal bleeding, rectal pain, bloating and flatus.  Genitourinary: Negative for dysuria, urgency, frequency, hematuria, flank pain, decreased urine volume, enuresis and difficulty urinating.  Musculoskeletal: Positive for arthralgias (right foot pain). Negative for myalgias, back pain, joint swelling and gait problem.  Neurological: Negative for dizziness, tremors, seizures, syncope, facial asymmetry, speech difficulty, weakness, light-headedness, numbness and headaches.  Hematological: Negative for adenopathy. Does not bruise/bleed easily.  Psychiatric/Behavioral: Negative.        Objective:   Physical Exam  Vitals reviewed. Constitutional: He is oriented to person, place, and time. He appears well-developed and  well-nourished. No distress.  HENT:  Head: Normocephalic and atraumatic.  Mouth/Throat: Oropharynx is clear and moist. No oropharyngeal exudate.  Eyes: Conjunctivae are normal. Right eye exhibits no discharge. Left eye exhibits no discharge. No scleral icterus.  Neck: Normal range of motion. Neck supple. No JVD present. No tracheal deviation present. No thyromegaly present.  Cardiovascular: Normal rate, regular rhythm, normal heart sounds and intact distal pulses.  Exam reveals no gallop and no friction rub.   No murmur heard. Pulses:      Carotid pulses are 1+ on the right side, and 1+ on the left side.      Radial pulses are 1+ on the right side, and 1+ on the left side.       Femoral pulses are 1+ on the right side, and 1+ on the left side.      Popliteal pulses are 1+ on the right side, and 1+ on the left side.       Dorsalis pedis pulses are 1+ on the right side, and 1+ on the left side.       Posterior tibial pulses are 1+ on the right side, and 1+ on the left side.  Pulmonary/Chest: Effort normal and breath sounds normal. No stridor. No respiratory distress. He has no wheezes. He has no rales. He exhibits no tenderness.  Abdominal: Soft. Bowel sounds are normal. He exhibits no distension and no mass. There is no tenderness. There is no rebound and no guarding.  Musculoskeletal: Normal range of motion. He exhibits no edema and no tenderness.       Right foot: Normal. He exhibits normal range of motion, no tenderness, no bony tenderness, no swelling, normal capillary refill, no crepitus, no deformity and no laceration.       Left foot: Normal. He exhibits normal range  of motion, no tenderness, no bony tenderness, no swelling, normal capillary refill, no crepitus, no deformity and no laceration.  Lymphadenopathy:    He has no cervical adenopathy.  Neurological: He is oriented to person, place, and time.  Skin: Skin is warm and dry. No rash noted. He is not diaphoretic. No erythema. No  pallor.  Psychiatric: He has a normal mood and affect. His behavior is normal. Judgment and thought content normal.     Lab Results  Component Value Date   WBC 5.1 03/19/2011   HGB 14.8 03/19/2011   HCT 43.7 03/19/2011   PLT 171.0 03/19/2011   GLUCOSE 203* 03/19/2011   CHOL 138 03/19/2011   TRIG 131.0 03/19/2011   HDL 46.50 03/19/2011   LDLCALC 65 03/19/2011   ALT 30 03/19/2011   AST 34 03/19/2011   NA 137 03/19/2011   K 4.9 03/19/2011   CL 97 03/19/2011   CREATININE 0.9 03/19/2011   BUN 22 03/19/2011   CO2 31 03/19/2011   TSH 2.11 03/19/2011   HGBA1C 6.2 03/19/2011       Assessment & Plan:

## 2011-05-27 NOTE — Assessment & Plan Note (Signed)
He is doing well on lipitor, I will recheck his FLP today

## 2011-05-27 NOTE — Assessment & Plan Note (Signed)
His BP is well controlled, I will check his lytes and renal function today 

## 2011-05-27 NOTE — Telephone Encounter (Signed)
[facility] Triage Call Report Triage Record Num: [id number] Operator: Sheryn Bison Patient Name: Manuel [name] Call Date & Time: 05/27/2011 7:20:50AM Patient Phone: [phone] PCP: Scarlette Calico Patient Gender: Male PCP Fax : Patient DOB: 1928-02-28 Practice Name: [facility] Reason for Call: Caller: [place]/Patient; PCP: Scarlette Calico; CB#: [phone]; Call regarding vomiting, onset 0500, has vomited x 2 times, no diarrhea, afebrile, has not taken blood sugar this am, does not have device at home to check it, already has appt this am. Diabetes:GI Problems Protocol Utilized. Already has appt scheduled for 05/27/11 am appt. Call back if sx worsens. Protocol(s) Used: Diabetes: Gastrointestinal Problems Protocol(s) Used: Nausea or Vomiting Recommended Outcome per Protocol: See Provider within 24 hours Reason for Outcome: Known diabetic Vomiting more than 1 time or diarrhea for more than 6 hours Care Advice: ~ 05/27/2011 8:05:21AM Page 1 of 1 [id number]

## 2011-06-02 ENCOUNTER — Other Ambulatory Visit (INDEPENDENT_AMBULATORY_CARE_PROVIDER_SITE_OTHER): Payer: Medicare Other

## 2011-06-02 DIAGNOSIS — IMO0001 Reserved for inherently not codable concepts without codable children: Secondary | ICD-10-CM

## 2011-06-02 DIAGNOSIS — K529 Noninfective gastroenteritis and colitis, unspecified: Secondary | ICD-10-CM

## 2011-06-02 DIAGNOSIS — E1142 Type 2 diabetes mellitus with diabetic polyneuropathy: Secondary | ICD-10-CM

## 2011-06-02 DIAGNOSIS — E785 Hyperlipidemia, unspecified: Secondary | ICD-10-CM | POA: Diagnosis not present

## 2011-06-02 DIAGNOSIS — I10 Essential (primary) hypertension: Secondary | ICD-10-CM

## 2011-06-02 DIAGNOSIS — E1149 Type 2 diabetes mellitus with other diabetic neurological complication: Secondary | ICD-10-CM | POA: Diagnosis not present

## 2011-06-02 DIAGNOSIS — K5289 Other specified noninfective gastroenteritis and colitis: Secondary | ICD-10-CM

## 2011-06-02 LAB — CBC WITH DIFFERENTIAL/PLATELET
Basophils Absolute: 0 10*3/uL (ref 0.0–0.1)
Eosinophils Absolute: 0.2 10*3/uL (ref 0.0–0.7)
HCT: 43.1 % (ref 39.0–52.0)
Lymphs Abs: 1.8 10*3/uL (ref 0.7–4.0)
MCHC: 33.2 g/dL (ref 30.0–36.0)
Monocytes Absolute: 0.7 10*3/uL (ref 0.1–1.0)
Monocytes Relative: 11.1 % (ref 3.0–12.0)
Neutro Abs: 3.2 10*3/uL (ref 1.4–7.7)
Platelets: 171 10*3/uL (ref 150.0–400.0)
RDW: 13.1 % (ref 11.5–14.6)

## 2011-06-02 LAB — COMPREHENSIVE METABOLIC PANEL
ALT: 50 U/L (ref 0–53)
AST: 42 U/L — ABNORMAL HIGH (ref 0–37)
CO2: 31 mEq/L (ref 19–32)
Chloride: 103 mEq/L (ref 96–112)
GFR: 95.82 mL/min (ref >60.00)
Sodium: 141 mEq/L (ref 135–145)
Total Bilirubin: 0.6 mg/dL (ref 0.3–1.2)
Total Protein: 6.9 g/dL (ref 6.0–8.3)

## 2011-06-02 LAB — URINALYSIS, ROUTINE W REFLEX MICROSCOPIC
Hgb urine dipstick: NEGATIVE
Ketones, ur: NEGATIVE
Total Protein, Urine: NEGATIVE
Urine Glucose: NEGATIVE
Urobilinogen, UA: 0.2 (ref 0.0–1.0)

## 2011-06-03 ENCOUNTER — Ambulatory Visit: Payer: Medicare Other | Admitting: Internal Medicine

## 2011-06-06 ENCOUNTER — Encounter: Payer: Self-pay | Admitting: Internal Medicine

## 2011-06-09 ENCOUNTER — Encounter: Payer: Self-pay | Admitting: Cardiology

## 2011-06-09 ENCOUNTER — Ambulatory Visit (INDEPENDENT_AMBULATORY_CARE_PROVIDER_SITE_OTHER): Payer: Medicare Other | Admitting: Cardiology

## 2011-06-09 VITALS — BP 139/70 | HR 69 | Ht 70.0 in | Wt 189.1 lb

## 2011-06-09 DIAGNOSIS — I2510 Atherosclerotic heart disease of native coronary artery without angina pectoris: Secondary | ICD-10-CM

## 2011-06-09 DIAGNOSIS — E785 Hyperlipidemia, unspecified: Secondary | ICD-10-CM | POA: Diagnosis not present

## 2011-06-09 MED ORDER — METOPROLOL TARTRATE 25 MG PO TABS: ORAL_TABLET | ORAL | Status: DC

## 2011-06-09 MED ORDER — ATORVASTATIN CALCIUM 40 MG PO TABS: 40.0000 mg | ORAL_TABLET | Freq: Every day | ORAL | Status: DC

## 2011-06-09 NOTE — Assessment & Plan Note (Signed)
LDL is at goal (< 70).

## 2011-06-09 NOTE — Patient Instructions (Signed)
Your physician wants you to follow-up in: 6 months with Dr Aundra Dubin. (October 2013)  You will receive a reminder letter in the mail two months in advance. If you don't receive a letter, please call our office to schedule the follow-up appointment.   Your physician recommends that you return for a FASTING lipid profile /liver profile in 6 months when you see Dr Aundra Dubin.

## 2011-06-09 NOTE — Progress Notes (Signed)
PCP: Dr. Ronnald Ramp  78 yo with history of CAD s/p MI in 1/11, HTN, DM, allergic asthma, and hyperlipidemia presents for cardiology followup.  Patient developed bilateral shoulder pain in 1/11 and went to the ER, where he was found to have NSTEMI.  LHC showed 99% mid CFX stenosis treated with DES.  EF was preserved.   Since I last saw him, Manuel [name] has been doing well.  No further lightheaded spells.  No chest pain.  He only gets short of breath walking up a steep hill.  He is walking about 1.5 miles 3 times a week.  He will be going to [facility] for a [employer] reunion soon.  He was started on azilsartan by Dr. Ronnald Ramp and was supposed to stop ramipril but is now taking both.   Labs (7/12): LDL 71, HDL 50, K 5, creatinine 0.8 Labs (1/13): LDL 65, HDL 47 Labs (4/13): K 4.6, creatinine 0.8  ECG: NSR, nonspecific T wave flattening, PVC x 1  PMH: 1. Asthma: Allergic component 2. CAD: s/p MI in 1/11.  LHC (1/11) with 99% mCFX treated with 2.5 x 16 Xience V DES; 50% mLAD; EF 55%.  ETT-myoview (10/12): 4'51", no significant ST segment changes, EF 56%, no ischemia or infarction.  3. GERD 4. Appendectomy 5. HTN 6. Hyperlipidemia 7. Diabetes mellitus type II 8. Lightheaded spells: Holter (10/12): Frequent PACs and PVCs.  No significant bradycardia.    FH: Parents lived into their 33s.  No premature CAD.    SH: Moved recently from [place], [place] to [place].  Married, originally from [place].  Now living in daughter's house.  Quit smoking 1990, retired Hotel manager, occasional smoker.     Current Outpatient Prescriptions  Medication Sig Dispense Refill  . albuterol (PROVENTIL,VENTOLIN) 90 MCG/ACT inhaler Inhale 2 puffs into the lungs every 6 (six) hours as needed.  17 g  11  . aspirin EC 81 MG EC tablet Take 1 tablet (81 mg total) by mouth daily.      [name] atorvastatin (LIPITOR) 40 MG tablet Take 1 tablet (40 mg total) by mouth daily.  90 tablet  3  . Azilsartan Medoxomil (EDARBI) 80 MG TABS Take 1 tablet (80  mg total) by mouth daily.  90 tablet  1  . esomeprazole (NEXIUM) 40 MG capsule Take 40 mg by mouth daily before breakfast.        . fish oil-omega-3 fatty acids 1000 MG capsule Take 1 g by mouth daily.       [name] l-methylfolate-B6-B12 (METANX) 3-35-2 MG TABS Take 1 tablet by mouth daily.  60 tablet  11  . metFORMIN (GLUCOPHAGE) 500 MG tablet Take 1,000 mg by mouth 2 (two) times daily with a meal. Take 2 tabs in am and 2 tabs in pm      . Multiple Vitamin (MULTIVITAMIN) tablet Take 1 tablet by mouth daily.      [name] DISCONTD: atorvastatin (LIPITOR) 40 MG tablet TAKE 1 TABLET BY MOUTH ONCE DAILY  30 tablet  1  . DISCONTD: metoprolol tartrate (LOPRESSOR) 25 MG tablet Take 12.5 mg by mouth 2 (two) times daily. Take 1/2 tablet daily.       . metoprolol tartrate (LOPRESSOR) 25 MG tablet Take 1/2 tablet twice a day  90 tablet  3  . promethazine (PHENERGAN) 12.5 MG tablet Take 1 tablet (12.5 mg total) by mouth every 6 (six) hours as needed for nausea.  15 tablet  0    BP 139/70  Pulse 69  Ht 5' 10"  (  1.778 m)  Wt 189 lb 1.9 oz (85.784 kg)  BMI 27.14 kg/m2 General: NAD Neck: No JVD, no thyromegaly or thyroid nodule.  Lungs: Clear to auscultation bilaterally with normal respiratory effort. CV: Nondisplaced PMI.  Heart regular S1/S2, no S3/S4, no murmur.  No peripheral edema.  No carotid bruit.  Normal pedal pulses.  Abdomen: Soft, nontender, no hepatosplenomegaly, no distention.  Neurologic: Alert and oriented x 3.  Psych: Normal affect. Extremities: No clubbing or cyanosis.

## 2011-06-09 NOTE — Assessment & Plan Note (Signed)
Recent myoview with no evidence for ischemia or infarction.  Continue ASA 81, statin, metoprolol, and ARB.   Continue regular exercise.  He is on both azilsartan and ramipril.  He does not need to be on both.  Dr. Ronnald Ramp had wanted him to stop ramipril, which I will have him do.

## 2011-06-10 ENCOUNTER — Other Ambulatory Visit: Payer: Self-pay | Admitting: Internal Medicine

## 2011-06-10 ENCOUNTER — Ambulatory Visit: Payer: Medicare Other | Admitting: Cardiology

## 2011-08-05 DIAGNOSIS — H52229 Regular astigmatism, unspecified eye: Secondary | ICD-10-CM | POA: Diagnosis not present

## 2011-08-05 DIAGNOSIS — H5210 Myopia, unspecified eye: Secondary | ICD-10-CM | POA: Diagnosis not present

## 2011-08-05 DIAGNOSIS — E119 Type 2 diabetes mellitus without complications: Secondary | ICD-10-CM | POA: Diagnosis not present

## 2011-08-05 LAB — HM DIABETES EYE EXAM

## 2011-09-03 ENCOUNTER — Other Ambulatory Visit: Payer: Self-pay | Admitting: Internal Medicine

## 2011-09-24 IMAGING — CR DG CHEST 2V
2 series · 2 of 2 positions shown · non-contrast
Comparison: None

CLINICAL DATA: Cough, prior smoker.

CHEST - 2 VIEW

[view not recorded (1 of 2)]
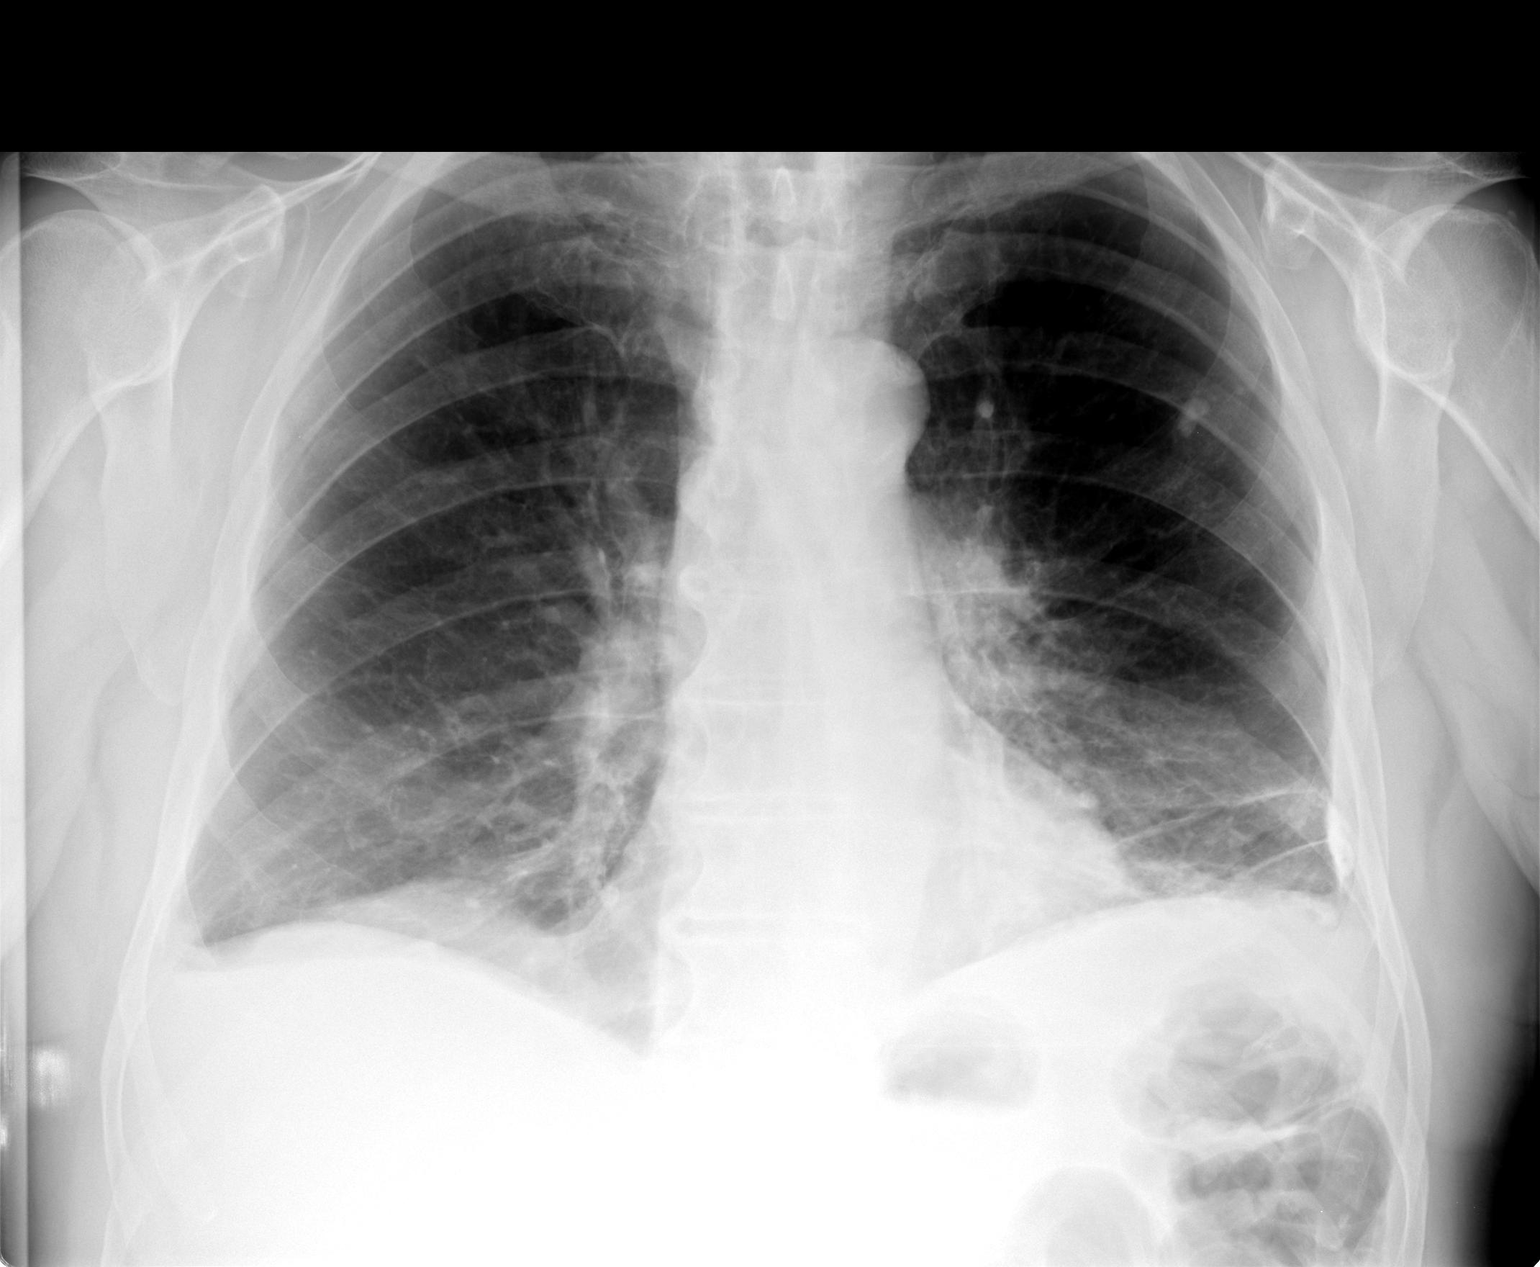

[view not recorded (2 of 2)]
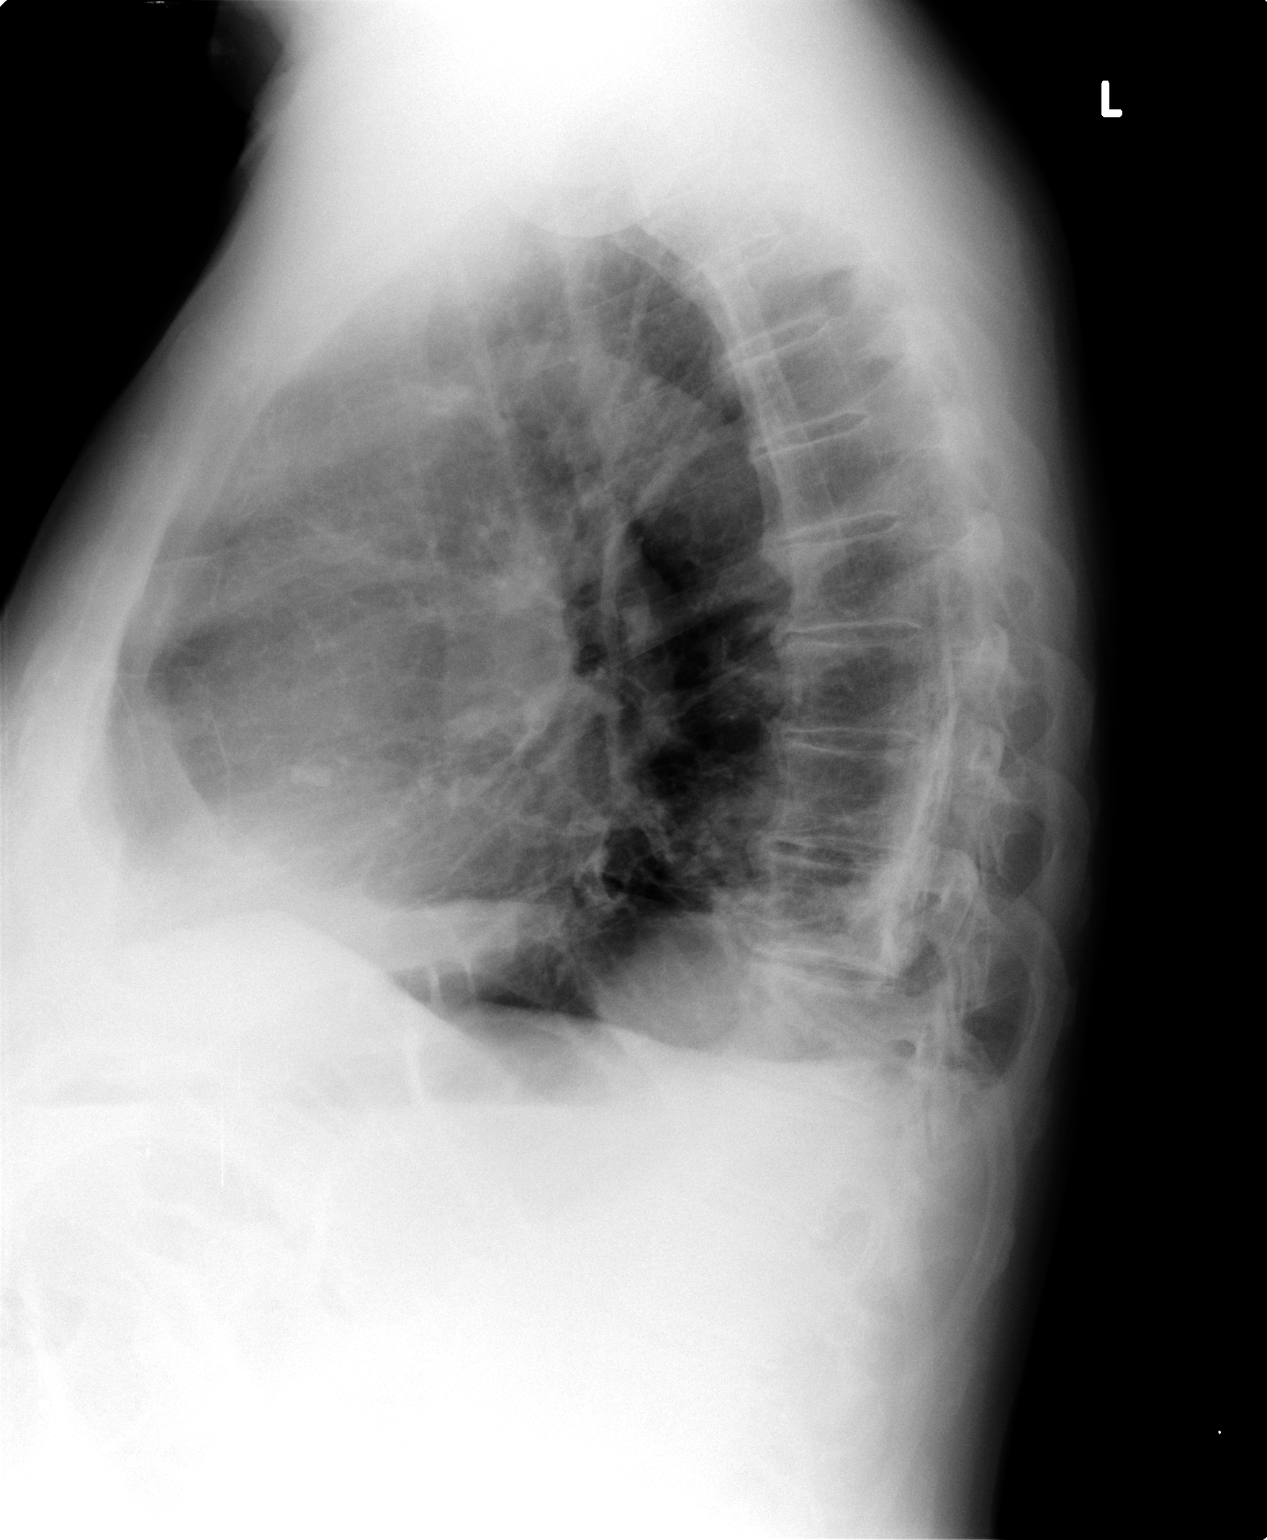

[2 of 2 positions shown; findings below may reference images not displayed]

FINDINGS: There are are linear densities in the lung bases
bilaterally which I suspect is scarring.  Underlying COPD changes.
Slight left pleural thickening and pleural calcifications laterally
in the left lower hemithorax.  Calcified granulomas noted in the
left upper lobe.  Heart is normal size.  No acute bony abnormality.
Degenerative changes in the thoracic spine.
IMPRESSION: COPD.  Bibasilar densities likely scarring.  Calcified pleural
plaques in the left lower hemithorax with pleural thickening.

## 2011-11-05 DIAGNOSIS — Z23 Encounter for immunization: Secondary | ICD-10-CM | POA: Diagnosis not present

## 2011-11-21 ENCOUNTER — Ambulatory Visit (INDEPENDENT_AMBULATORY_CARE_PROVIDER_SITE_OTHER): Payer: Medicare Other | Admitting: Cardiology

## 2011-11-21 ENCOUNTER — Encounter: Payer: Self-pay | Admitting: Cardiology

## 2011-11-21 ENCOUNTER — Other Ambulatory Visit (INDEPENDENT_AMBULATORY_CARE_PROVIDER_SITE_OTHER): Payer: Medicare Other

## 2011-11-21 VITALS — BP 140/78 | HR 67 | Ht 70.0 in | Wt 194.0 lb

## 2011-11-21 DIAGNOSIS — Z79899 Other long term (current) drug therapy: Secondary | ICD-10-CM

## 2011-11-21 DIAGNOSIS — R0989 Other specified symptoms and signs involving the circulatory and respiratory systems: Secondary | ICD-10-CM

## 2011-11-21 DIAGNOSIS — E785 Hyperlipidemia, unspecified: Secondary | ICD-10-CM

## 2011-11-21 DIAGNOSIS — I2510 Atherosclerotic heart disease of native coronary artery without angina pectoris: Secondary | ICD-10-CM | POA: Diagnosis not present

## 2011-11-21 LAB — BASIC METABOLIC PANEL
GFR: 97.07 mL/min (ref >60.00)
Glucose, Bld: 107 mg/dL — ABNORMAL HIGH (ref 70–99)
Potassium: 4.4 mEq/L (ref 3.5–5.1)
Sodium: 139 mEq/L (ref 135–145)

## 2011-11-21 NOTE — Patient Instructions (Signed)
Your physician wants you to follow-up in:   [place] will receive a reminder letter in the mail two months in advance. If you don't receive a letter, please call our office to schedule the follow-up appointment. Your physician recommends that you continue on your current medications as directed. Please refer to the Current Medication list given to you today. Your physician recommends that you return for lab work in: [place] [facility]

## 2011-11-23 NOTE — Progress Notes (Signed)
Patient ID: Manuel [name], male   DOB: [birth year], 78 y.o.   MRN: [id number] PCP: Dr. Ronnald Ramp  78 yo with history of CAD s/p MI in 1/11, HTN, DM, allergic asthma, and hyperlipidemia presents for cardiology followup.  Patient developed bilateral shoulder pain in 1/11 and went to the ER, where he was found to have NSTEMI.  LHC showed 99% mid CFX stenosis treated with DES.  EF was preserved.   Since I last saw him, Manuel [name] has been doing well.  No exertional chest pain; he has occasional mild, atypical chest pain.  He only gets short of breath walking up a steep hill.  He plays golf about once a week.    Labs (7/12): LDL 71, HDL 50, K 5, creatinine 0.8 Labs (1/13): LDL 65, HDL 47 Labs (4/13): K 4.6, creatinine 0.8  PMH: 1. Asthma: Allergic component 2. CAD: s/p MI in 1/11.  LHC (1/11) with 99% mCFX treated with 2.5 x 16 Xience V DES; 50% mLAD; EF 55%.  ETT-myoview (10/12): 4'51", no significant ST segment changes, EF 56%, no ischemia or infarction.  3. GERD 4. Appendectomy 5. HTN 6. Hyperlipidemia 7. Diabetes mellitus type II 8. Lightheaded spells: Holter (10/12): Frequent PACs and PVCs.  No significant bradycardia.    FH: Parents lived into their 74s.  No premature CAD.    SH: Moved recently from [place], [place] to [place].  Married, originally from [place].  Now living in daughter's house.  Quit smoking 1990, retired Hotel manager, occasional smoker.     Current Outpatient Prescriptions  Medication Sig Dispense Refill  . albuterol (PROVENTIL,VENTOLIN) 90 MCG/ACT inhaler Inhale 2 puffs into the lungs every 6 (six) hours as needed.  17 g  11  . aspirin EC 81 MG EC tablet Take 1 tablet (81 mg total) by mouth daily.      [name] atorvastatin (LIPITOR) 40 MG tablet Take 1 tablet (40 mg total) by mouth daily.  90 tablet  3  . Azilsartan Medoxomil (EDARBI) 80 MG TABS Take 1 tablet (80 mg total) by mouth daily.  90 tablet  1  . benzonatate (TESSALON) 100 MG capsule TAKE ONE CAPSULE TIDLPRN FOR COUGH  30  capsule  0  . esomeprazole (NEXIUM) 40 MG capsule Take 40 mg by mouth daily before breakfast.        . fish oil-omega-3 fatty acids 1000 MG capsule Take 1 g by mouth daily.       [name] l-methylfolate-B6-B12 (METANX) 3-35-2 MG TABS Take 1 tablet by mouth daily.  60 tablet  11  . metFORMIN (GLUCOPHAGE) 500 MG tablet TAKE 2 TABLETS BY MOUTH TWICE A DAY  360 tablet  3  . metoprolol tartrate (LOPRESSOR) 25 MG tablet Take 1/2 tablet twice a day  90 tablet  3  . Multiple Vitamin (MULTIVITAMIN) tablet Take 1 tablet by mouth daily.      . promethazine (PHENERGAN) 12.5 MG tablet Take 1 tablet (12.5 mg total) by mouth every 6 (six) hours as needed for nausea.  15 tablet  0    BP 140/78  Pulse 67  Ht 5' 10"  (1.778 m)  Wt 194 lb (87.998 kg)  BMI 27.84 kg/m2  SpO2 94% General: NAD Neck: No JVD, no thyromegaly or thyroid nodule.  Lungs: Clear to auscultation bilaterally with normal respiratory effort. CV: Nondisplaced PMI.  Heart regular S1/S2, no S3/S4, no murmur.  No peripheral edema.  No carotid bruit.  Normal pedal pulses.  Abdomen: Soft, nontender, no hepatosplenomegaly, no distention.  Neurologic: Alert and oriented x 3.  Psych: Normal affect. Extremities: No clubbing or cyanosis.   Assessment/Plan: CORONARY HEART DISEASE  Myoview 10/12 with no evidence for ischemia or infarction. Continue ASA 81, statin, metoprolol, and ARB.  I would like him to try to exercise more, walk 20-30 minutes 5-6 times a week. Hyperlipidemia Check lipids, goal LDL < 70.   Clifford Benninger [employer]

## 2011-12-18 ENCOUNTER — Other Ambulatory Visit: Payer: Self-pay

## 2011-12-18 MED ORDER — ALBUTEROL SULFATE HFA 108 (90 BASE) MCG/ACT IN AERS: 2.0000 | INHALATION_SPRAY | Freq: Four times a day (QID) | RESPIRATORY_TRACT | Status: DC | PRN

## 2012-01-11 ENCOUNTER — Other Ambulatory Visit: Payer: Self-pay | Admitting: Internal Medicine

## 2012-01-19 ENCOUNTER — Other Ambulatory Visit (INDEPENDENT_AMBULATORY_CARE_PROVIDER_SITE_OTHER): Payer: Medicare Other

## 2012-01-19 ENCOUNTER — Ambulatory Visit (INDEPENDENT_AMBULATORY_CARE_PROVIDER_SITE_OTHER): Payer: Medicare Other | Admitting: Internal Medicine

## 2012-01-19 ENCOUNTER — Encounter: Payer: Self-pay | Admitting: Internal Medicine

## 2012-01-19 VITALS — BP 150/78 | HR 64 | Temp 96.9°F | Resp 14 | Ht 70.0 in | Wt 199.0 lb

## 2012-01-19 DIAGNOSIS — I10 Essential (primary) hypertension: Secondary | ICD-10-CM

## 2012-01-19 DIAGNOSIS — E1149 Type 2 diabetes mellitus with other diabetic neurological complication: Secondary | ICD-10-CM

## 2012-01-19 DIAGNOSIS — E1142 Type 2 diabetes mellitus with diabetic polyneuropathy: Secondary | ICD-10-CM | POA: Diagnosis not present

## 2012-01-19 DIAGNOSIS — J45909 Unspecified asthma, uncomplicated: Secondary | ICD-10-CM

## 2012-01-19 DIAGNOSIS — IMO0001 Reserved for inherently not codable concepts without codable children: Secondary | ICD-10-CM

## 2012-01-19 LAB — BASIC METABOLIC PANEL
BUN: 16 mg/dL (ref 6–23)
Chloride: 99 mEq/L (ref 96–112)
GFR: 94.34 mL/min (ref >60.00)
Glucose, Bld: 78 mg/dL (ref 70–99)
Potassium: 5.3 mEq/L — ABNORMAL HIGH (ref 3.5–5.1)
Sodium: 138 mEq/L (ref 135–145)

## 2012-01-19 MED ORDER — OLMESARTAN MEDOXOMIL-HCTZ 40-12.5 MG PO TABS: 1.0000 | ORAL_TABLET | Freq: Every day | ORAL | Status: DC

## 2012-01-19 MED ORDER — BUDESONIDE-FORMOTEROL FUMARATE 160-4.5 MCG/ACT IN AERO: 2.0000 | INHALATION_SPRAY | Freq: Two times a day (BID) | RESPIRATORY_TRACT | Status: DC

## 2012-01-19 NOTE — Assessment & Plan Note (Signed)
His BP is not well controlled so I have asked him to switch to [facility] I will check his lytes and renal function today

## 2012-01-19 NOTE — Assessment & Plan Note (Signed)
I will check his a1c and will monitor his renal function 

## 2012-01-19 NOTE — Assessment & Plan Note (Signed)
I think he needs to be on an ICS so I have asked him to start Symbicort

## 2012-01-19 NOTE — Progress Notes (Signed)
Subjective:    Patient ID: Manuel [name], male    DOB: 16-Feb-1929, 78 y.o.   MRN: [id number]  Hypertension This is a chronic problem. The current episode started more than 1 year ago. The problem has been gradually worsening since onset. The problem is uncontrolled. Associated symptoms include peripheral edema. Pertinent negatives include no anxiety, blurred vision, chest pain, headaches, malaise/fatigue, neck pain, orthopnea, palpitations, PND, shortness of breath or sweats. Past treatments include beta blockers and angiotensin blockers. Compliance problems include exercise and diet.  Hypertensive end-organ damage includes CAD/MI.  Asthma He complains of cough and wheezing. There is no chest tightness, difficulty breathing, frequent throat clearing, hemoptysis, hoarse voice, shortness of breath or sputum production. This is a chronic problem. The current episode started more than 1 year ago. The problem occurs intermittently. The problem has been gradually worsening. The cough is non-productive. Associated symptoms include nasal congestion and rhinorrhea. Pertinent negatives include no appetite change, chest pain, dyspnea on exertion, ear congestion, ear pain, fever, headaches, heartburn, malaise/fatigue, myalgias, orthopnea, PND, postnasal drip, sneezing, sweats, trouble swallowing or weight loss. His symptoms are alleviated by beta-agonist. He reports moderate improvement on treatment. His past medical history is significant for asthma.      Review of Systems  Constitutional: Negative for fever, chills, weight loss, malaise/fatigue, diaphoresis, activity change, appetite change, fatigue and unexpected weight change.  HENT: Positive for rhinorrhea. Negative for ear pain, nosebleeds, congestion, hoarse voice, facial swelling, sneezing, trouble swallowing, neck pain, voice change, postnasal drip and sinus pressure.   Eyes: Negative.  Negative for blurred vision.  Respiratory: Positive for cough and  wheezing. Negative for apnea, hemoptysis, sputum production, choking, chest tightness, shortness of breath and stridor.   Cardiovascular: Positive for leg swelling. Negative for chest pain, dyspnea on exertion, palpitations, orthopnea and PND.  Gastrointestinal: Negative for heartburn, nausea, vomiting, abdominal pain, diarrhea, constipation and blood in stool.  Genitourinary: Negative.   Musculoskeletal: Negative for myalgias, back pain, joint swelling, arthralgias and gait problem.  Skin: Negative for color change, pallor, rash and wound.  Neurological: Negative for dizziness, tremors, seizures, syncope, facial asymmetry, speech difficulty, weakness, light-headedness, numbness and headaches.  Hematological: Negative for adenopathy. Does not bruise/bleed easily.  Psychiatric/Behavioral: Negative.        Objective:   Physical Exam  Vitals reviewed. Constitutional: He is oriented to person, place, and time. He appears well-developed and well-nourished.  Non-toxic appearance. He does not have a sickly appearance. He does not appear ill. No distress.  HENT:  Head: Normocephalic and atraumatic.  Mouth/Throat: Oropharynx is clear and moist. No oropharyngeal exudate.  Eyes: Conjunctivae normal are normal. Right eye exhibits no discharge. Left eye exhibits no discharge. No scleral icterus.  Neck: Normal range of motion. Neck supple. No JVD present. No tracheal deviation present. No thyromegaly present.  Cardiovascular: Normal rate, regular rhythm, normal heart sounds and intact distal pulses.  Exam reveals no gallop and no friction rub.   No murmur heard. Pulmonary/Chest: Effort normal and breath sounds normal. No stridor. No respiratory distress. He has no wheezes. He has no rales. He exhibits no tenderness.  Abdominal: Soft. Bowel sounds are normal. He exhibits no distension and no mass. There is no tenderness. There is no rebound and no guarding.  Musculoskeletal: He exhibits edema (1+ pitting  edema in BLE). He exhibits no tenderness.  Lymphadenopathy:    He has no cervical adenopathy.  Neurological: He is oriented to person, place, and time.  Skin: Skin is warm and dry.  No rash noted. He is not diaphoretic. No erythema. No pallor.  Psychiatric: He has a normal mood and affect. His behavior is normal. Judgment and thought content normal.     Lab Results  Component Value Date   WBC 5.9 06/02/2011   HGB 14.3 06/02/2011   HCT 43.1 06/02/2011   PLT 171.0 06/02/2011   GLUCOSE 107* 11/21/2011   CHOL 138 03/19/2011   TRIG 131.0 03/19/2011   HDL 46.50 03/19/2011   LDLCALC 65 03/19/2011   ALT 50 06/02/2011   AST 42* 06/02/2011   NA 139 11/21/2011   K 4.4 11/21/2011   CL 102 11/21/2011   CREATININE 0.8 11/21/2011   BUN 16 11/21/2011   CO2 30 11/21/2011   TSH 1.48 06/02/2011   HGBA1C 6.5 06/02/2011       Assessment & Plan:

## 2012-01-19 NOTE — Patient Instructions (Signed)

## 2012-05-17 ENCOUNTER — Encounter: Payer: Self-pay | Admitting: Internal Medicine

## 2012-05-17 ENCOUNTER — Other Ambulatory Visit (INDEPENDENT_AMBULATORY_CARE_PROVIDER_SITE_OTHER): Payer: Medicare Other

## 2012-05-17 ENCOUNTER — Ambulatory Visit (INDEPENDENT_AMBULATORY_CARE_PROVIDER_SITE_OTHER)
Admission: RE | Admit: 2012-05-17 | Discharge: 2012-05-17 | Disposition: A | Discharge status: Home / Self Care | Payer: Medicare Other | Source: Ambulatory Visit | Attending: Internal Medicine | Admitting: Internal Medicine

## 2012-05-17 ENCOUNTER — Ambulatory Visit (INDEPENDENT_AMBULATORY_CARE_PROVIDER_SITE_OTHER): Payer: Medicare Other | Admitting: Internal Medicine

## 2012-05-17 VITALS — BP 116/64 | HR 66 | Temp 98.2°F | Resp 16 | Wt 199.5 lb

## 2012-05-17 DIAGNOSIS — E1149 Type 2 diabetes mellitus with other diabetic neurological complication: Secondary | ICD-10-CM

## 2012-05-17 DIAGNOSIS — I10 Essential (primary) hypertension: Secondary | ICD-10-CM

## 2012-05-17 DIAGNOSIS — I2510 Atherosclerotic heart disease of native coronary artery without angina pectoris: Secondary | ICD-10-CM | POA: Diagnosis not present

## 2012-05-17 DIAGNOSIS — M25579 Pain in unspecified ankle and joints of unspecified foot: Secondary | ICD-10-CM

## 2012-05-17 DIAGNOSIS — M19079 Primary osteoarthritis, unspecified ankle and foot: Secondary | ICD-10-CM | POA: Diagnosis not present

## 2012-05-17 DIAGNOSIS — E1142 Type 2 diabetes mellitus with diabetic polyneuropathy: Secondary | ICD-10-CM

## 2012-05-17 DIAGNOSIS — M25571 Pain in right ankle and joints of right foot: Secondary | ICD-10-CM

## 2012-05-17 DIAGNOSIS — E785 Hyperlipidemia, unspecified: Secondary | ICD-10-CM

## 2012-05-17 DIAGNOSIS — K219 Gastro-esophageal reflux disease without esophagitis: Secondary | ICD-10-CM

## 2012-05-17 LAB — COMPREHENSIVE METABOLIC PANEL
ALT: 54 U/L — ABNORMAL HIGH (ref 0–53)
AST: 56 U/L — ABNORMAL HIGH (ref 0–37)
CO2: 34 mEq/L — ABNORMAL HIGH (ref 19–32)
Calcium: 9.7 mg/dL (ref 8.4–10.5)
Chloride: 96 mEq/L (ref 96–112)
Creatinine, Ser: 1 mg/dL (ref 0.4–1.5)
Potassium: 5.3 mEq/L — ABNORMAL HIGH (ref 3.5–5.1)
Sodium: 137 mEq/L (ref 135–145)
Total Protein: 7.8 g/dL (ref 6.0–8.3)

## 2012-05-17 LAB — TSH: TSH: 2.1 u[IU]/mL (ref 0.35–5.50)

## 2012-05-17 LAB — CBC WITH DIFFERENTIAL/PLATELET
Basophils Absolute: 0.1 10*3/uL (ref 0.0–0.1)
Eosinophils Absolute: 0.4 10*3/uL (ref 0.0–0.7)
Hemoglobin: 13.9 g/dL (ref 13.0–17.0)
Lymphocytes Relative: 28.6 % (ref 12.0–46.0)
Lymphs Abs: 1.6 10*3/uL (ref 0.7–4.0)
MCHC: 33.7 g/dL (ref 30.0–36.0)
Neutro Abs: 2.8 10*3/uL (ref 1.4–7.7)
Platelets: 184 10*3/uL (ref 150.0–400.0)
RDW: 13.9 % (ref 11.5–14.6)

## 2012-05-17 LAB — LIPID PANEL
Cholesterol: 134 mg/dL (ref 0–200)
VLDL: 23.8 mg/dL (ref 0.0–40.0)

## 2012-05-17 LAB — HEMOGLOBIN A1C: Hgb A1c MFr Bld: 6.7 % — ABNORMAL HIGH (ref 4.6–6.5)

## 2012-05-17 LAB — HM DIABETES FOOT EXAM

## 2012-05-17 NOTE — Progress Notes (Signed)
Subjective:    Patient ID: Manuel [name], male    DOB: Apr 25, 1928, 79 y.o.   MRN: [id number]  Diabetes He presents for his follow-up diabetic visit. He has type 2 diabetes mellitus. His disease course has been stable. There are no hypoglycemic associated symptoms. Pertinent negatives for hypoglycemia include no dizziness, headaches, pallor, seizures, speech difficulty or tremors. Pertinent negatives for diabetes include no blurred vision, no chest pain, no fatigue, no foot paresthesias, no foot ulcerations, no polydipsia, no polyphagia, no polyuria, no visual change, no weakness and no weight loss. There are no hypoglycemic complications. Diabetic complications include peripheral neuropathy. Current diabetic treatment includes oral agent (monotherapy). He is compliant with treatment all of the time. His weight is stable. He is following a generally healthy diet. Meal planning includes avoidance of concentrated sweets. He has not had a previous visit with a dietician. He participates in exercise intermittently. There is no change in his home blood glucose trend. An ACE inhibitor/angiotensin II receptor blocker is being taken. He does not see a podiatrist.Eye exam is current.      Review of Systems  Constitutional: Negative.  Negative for fever, chills, weight loss, diaphoresis, activity change, appetite change, fatigue and unexpected weight change.  HENT: Negative.   Eyes: Negative.  Negative for blurred vision.  Respiratory: Negative.  Negative for apnea, cough, choking, chest tightness, shortness of breath, wheezing and stridor.   Cardiovascular: Negative.  Negative for chest pain, palpitations and leg swelling.  Gastrointestinal: Negative.  Negative for nausea, vomiting, abdominal pain, diarrhea and constipation.  Endocrine: Negative.  Negative for polydipsia, polyphagia and polyuria.  Genitourinary: Negative.   Musculoskeletal: Positive for arthralgias (rt foot pain for several weeks, no injury).  Negative for myalgias, back pain, joint swelling and gait problem.  Skin: Negative.  Negative for color change, pallor, rash and wound.  Allergic/Immunologic: Negative.   Neurological: Negative.  Negative for dizziness, tremors, seizures, syncope, facial asymmetry, speech difficulty, weakness, light-headedness, numbness and headaches.  Hematological: Negative.  Negative for adenopathy. Does not bruise/bleed easily.  Psychiatric/Behavioral: Negative.        Objective:   Physical Exam  Vitals reviewed. Constitutional: He is oriented to person, place, and time. He appears well-developed and well-nourished. No distress.  HENT:  Head: Normocephalic and atraumatic.  Mouth/Throat: Oropharynx is clear and moist. No oropharyngeal exudate.  Eyes: Conjunctivae are normal. Right eye exhibits no discharge. Left eye exhibits no discharge. No scleral icterus.  Neck: Normal range of motion. Neck supple. No JVD present. No tracheal deviation present. No thyromegaly present.  Cardiovascular: Normal rate, regular rhythm, normal heart sounds and intact distal pulses.  Exam reveals no gallop and no friction rub.   No murmur heard. Pulmonary/Chest: Effort normal and breath sounds normal. No stridor. No respiratory distress. He has no wheezes. He has no rales. He exhibits no tenderness.  Abdominal: Soft. Bowel sounds are normal. He exhibits no distension and no mass. There is no tenderness. There is no rebound and no guarding.  Musculoskeletal: Normal range of motion. He exhibits no edema and no tenderness.       Right ankle: Normal. He exhibits normal range of motion, no swelling, no ecchymosis, no deformity, no laceration and normal pulse. No tenderness. Achilles tendon normal. Achilles tendon exhibits no pain, no defect and normal Thompson's test results.  Lymphadenopathy:    He has no cervical adenopathy.  Neurological: He is oriented to person, place, and time.  Skin: Skin is warm and dry. No rash noted. He  is not diaphoretic. No erythema. No pallor.  Psychiatric: He has a normal mood and affect. His behavior is normal. Judgment and thought content normal.     Lab Results  Component Value Date   WBC 5.9 06/02/2011   HGB 14.3 06/02/2011   HCT 43.1 06/02/2011   PLT 171.0 06/02/2011   GLUCOSE 78 01/19/2012   CHOL 138 03/19/2011   TRIG 131.0 03/19/2011   HDL 46.50 03/19/2011   LDLCALC 65 03/19/2011   ALT 50 06/02/2011   AST 42* 06/02/2011   NA 138 01/19/2012   K 5.3* 01/19/2012   CL 99 01/19/2012   CREATININE 0.8 01/19/2012   BUN 16 01/19/2012   CO2 35* 01/19/2012   TSH 1.48 06/02/2011   HGBA1C 6.6* 01/19/2012       Assessment & Plan:

## 2012-05-17 NOTE — Assessment & Plan Note (Signed)
FLP today 

## 2012-05-17 NOTE — Assessment & Plan Note (Signed)
Plain film shows mild djd

## 2012-05-17 NOTE — Assessment & Plan Note (Signed)
His BP is well controlled Today I will check his lytes and renal function 

## 2012-05-17 NOTE — Assessment & Plan Note (Signed)
I will check his a1c today and will address if needed

## 2012-05-17 NOTE — Patient Instructions (Signed)

## 2012-06-16 ENCOUNTER — Other Ambulatory Visit: Payer: Self-pay | Admitting: Internal Medicine

## 2012-06-17 ENCOUNTER — Other Ambulatory Visit: Payer: Self-pay | Admitting: Internal Medicine

## 2012-07-12 ENCOUNTER — Other Ambulatory Visit: Payer: Self-pay | Admitting: Cardiology

## 2012-08-04 ENCOUNTER — Other Ambulatory Visit: Payer: Self-pay | Admitting: Cardiology

## 2012-08-04 DIAGNOSIS — H04129 Dry eye syndrome of unspecified lacrimal gland: Secondary | ICD-10-CM | POA: Diagnosis not present

## 2012-08-04 DIAGNOSIS — H52229 Regular astigmatism, unspecified eye: Secondary | ICD-10-CM | POA: Diagnosis not present

## 2012-08-04 DIAGNOSIS — E119 Type 2 diabetes mellitus without complications: Secondary | ICD-10-CM | POA: Diagnosis not present

## 2012-08-04 DIAGNOSIS — H524 Presbyopia: Secondary | ICD-10-CM | POA: Diagnosis not present

## 2012-08-04 LAB — HM DIABETES EYE EXAM

## 2012-08-05 ENCOUNTER — Other Ambulatory Visit: Payer: Self-pay

## 2012-08-05 DIAGNOSIS — J45909 Unspecified asthma, uncomplicated: Secondary | ICD-10-CM

## 2012-08-05 MED ORDER — BUDESONIDE-FORMOTEROL FUMARATE 160-4.5 MCG/ACT IN AERO: 2.0000 | INHALATION_SPRAY | Freq: Two times a day (BID) | RESPIRATORY_TRACT | Status: DC

## 2012-09-14 ENCOUNTER — Other Ambulatory Visit: Payer: Self-pay | Admitting: Internal Medicine

## 2012-09-21 ENCOUNTER — Encounter: Payer: Self-pay | Admitting: Internal Medicine

## 2012-09-21 ENCOUNTER — Ambulatory Visit (INDEPENDENT_AMBULATORY_CARE_PROVIDER_SITE_OTHER): Payer: Medicare Other | Admitting: Internal Medicine

## 2012-09-21 ENCOUNTER — Other Ambulatory Visit (INDEPENDENT_AMBULATORY_CARE_PROVIDER_SITE_OTHER): Payer: Medicare Other

## 2012-09-21 VITALS — BP 124/82 | HR 62 | Temp 97.6°F | Resp 16 | Wt 194.0 lb

## 2012-09-21 DIAGNOSIS — I10 Essential (primary) hypertension: Secondary | ICD-10-CM

## 2012-09-21 DIAGNOSIS — Z23 Encounter for immunization: Secondary | ICD-10-CM

## 2012-09-21 DIAGNOSIS — E1149 Type 2 diabetes mellitus with other diabetic neurological complication: Secondary | ICD-10-CM

## 2012-09-21 DIAGNOSIS — E1142 Type 2 diabetes mellitus with diabetic polyneuropathy: Secondary | ICD-10-CM | POA: Diagnosis not present

## 2012-09-21 LAB — COMPREHENSIVE METABOLIC PANEL
ALT: 49 U/L (ref 0–53)
AST: 49 U/L — ABNORMAL HIGH (ref 0–37)
Chloride: 99 mEq/L (ref 96–112)
Creatinine, Ser: 0.9 mg/dL (ref 0.4–1.5)
Sodium: 139 mEq/L (ref 135–145)
Total Bilirubin: 0.9 mg/dL (ref 0.3–1.2)
Total Protein: 7.4 g/dL (ref 6.0–8.3)

## 2012-09-21 MED ORDER — ATORVASTATIN CALCIUM 40 MG PO TABS: ORAL_TABLET | ORAL | Status: DC

## 2012-09-21 MED ORDER — OLMESARTAN MEDOXOMIL-HCTZ 40-12.5 MG PO TABS: ORAL_TABLET | ORAL | Status: DC

## 2012-09-21 NOTE — Assessment & Plan Note (Signed)
I will check his A1C and will make changes if needed Will also monitor his renal function

## 2012-09-21 NOTE — Progress Notes (Signed)
Subjective:    Patient ID: Manuel [name], male    DOB: 1928/06/02, 79 y.o.   MRN: [id number]  Diabetes He presents for his follow-up diabetic visit. He has type 2 diabetes mellitus. His disease course has been stable. There are no hypoglycemic associated symptoms. Pertinent negatives for hypoglycemia include no dizziness or tremors. Pertinent negatives for diabetes include no blurred vision, no chest pain, no fatigue, no foot paresthesias, no foot ulcerations, no polydipsia, no polyphagia, no polyuria, no visual change, no weakness and no weight loss. There are no hypoglycemic complications. Symptoms are stable. Diabetic complications include heart disease. Current diabetic treatment includes oral agent (monotherapy). He is compliant with treatment all of the time. His weight is stable. He is following a generally healthy diet. Meal planning includes avoidance of concentrated sweets. He has not had a previous visit with a dietician. He participates in exercise intermittently. There is no change in his home blood glucose trend. An ACE inhibitor/angiotensin II receptor blocker is being taken. He does not see a podiatrist.     Review of Systems  Constitutional: Negative.  Negative for fever, chills, weight loss, diaphoresis and fatigue.  HENT: Negative.   Eyes: Negative.  Negative for blurred vision.  Respiratory: Negative.  Negative for apnea, cough, chest tightness, shortness of breath, wheezing and stridor.   Cardiovascular: Negative.  Negative for chest pain, palpitations and leg swelling.  Gastrointestinal: Negative.  Negative for nausea, vomiting, abdominal pain, diarrhea, constipation and blood in stool.  Endocrine: Negative.  Negative for polydipsia, polyphagia and polyuria.  Genitourinary: Negative.  Negative for dysuria, frequency, flank pain and difficulty urinating.  Musculoskeletal: Negative.  Negative for myalgias, back pain, joint swelling, arthralgias and gait problem.  Skin: Negative.    Allergic/Immunologic: Negative.   Neurological: Negative.  Negative for dizziness, tremors, weakness and light-headedness.  Hematological: Negative.  Negative for adenopathy. Does not bruise/bleed easily.  Psychiatric/Behavioral: Negative.        Objective:   Physical Exam  Vitals reviewed. Constitutional: He is oriented to person, place, and time. He appears well-developed and well-nourished. No distress.  HENT:  Head: Normocephalic and atraumatic.  Mouth/Throat: Oropharynx is clear and moist. No oropharyngeal exudate.  Eyes: Conjunctivae are normal. Right eye exhibits no discharge. Left eye exhibits no discharge. No scleral icterus.  Neck: Normal range of motion. Neck supple. No JVD present. No tracheal deviation present. No thyromegaly present.  Cardiovascular: Normal rate, regular rhythm, normal heart sounds and intact distal pulses.  Exam reveals no gallop and no friction rub.   No murmur heard. Pulmonary/Chest: Effort normal and breath sounds normal. No stridor. No respiratory distress. He has no wheezes. He has no rales. He exhibits no tenderness.  Abdominal: Soft. Bowel sounds are normal. He exhibits no distension and no mass. There is no tenderness. There is no rebound and no guarding.  Musculoskeletal: Normal range of motion. He exhibits edema (trace edema in BLE). He exhibits no tenderness.  Lymphadenopathy:    He has no cervical adenopathy.  Neurological: He is oriented to person, place, and time.  Skin: Skin is warm and dry. No rash noted. He is not diaphoretic. No erythema. No pallor.  Psychiatric: He has a normal mood and affect. His behavior is normal. Judgment and thought content normal.     Lab Results  Component Value Date   WBC 5.5 05/17/2012   HGB 13.9 05/17/2012   HCT 41.5 05/17/2012   PLT 184.0 05/17/2012   GLUCOSE 83 05/17/2012   CHOL 134 05/17/2012  TRIG 119.0 05/17/2012   HDL 41.50 05/17/2012   LDLCALC 69 05/17/2012   ALT 54* 05/17/2012   AST 56* 05/17/2012    NA 137 05/17/2012   K 5.3* 05/17/2012   CL 96 05/17/2012   CREATININE 1.0 05/17/2012   BUN 21 05/17/2012   CO2 34* 05/17/2012   TSH 2.10 05/17/2012   HGBA1C 6.7* 05/17/2012       Assessment & Plan:

## 2012-09-21 NOTE — Assessment & Plan Note (Signed)
His BP is well controlled Today I will check his lytes and renal function 

## 2012-11-15 DIAGNOSIS — M25569 Pain in unspecified knee: Secondary | ICD-10-CM | POA: Diagnosis not present

## 2012-12-30 ENCOUNTER — Other Ambulatory Visit: Payer: Self-pay

## 2013-03-19 ENCOUNTER — Telehealth: Payer: Self-pay | Admitting: Physician Assistant

## 2013-03-19 ENCOUNTER — Other Ambulatory Visit: Payer: Self-pay | Admitting: Internal Medicine

## 2013-03-19 DIAGNOSIS — R079 Chest pain, unspecified: Secondary | ICD-10-CM

## 2013-03-19 DIAGNOSIS — I25810 Atherosclerosis of coronary artery bypass graft(s) without angina pectoris: Secondary | ICD-10-CM

## 2013-03-19 MED ORDER — NITROGLYCERIN 0.4 MG SL SUBL: 0.4000 mg | SUBLINGUAL_TABLET | SUBLINGUAL | Status: DC | PRN

## 2013-03-19 NOTE — Telephone Encounter (Signed)
Sharp pain in chest last night going to bed. Resolved spontaneously. L  Chest x seconds.  Happened twice. No SOB.  No radiation.  No other symptoms. Today had an ache in the same area for several seconds. "I feel great now." Nothing like MI. Did eat a Reuben yesterday. I have asked him to monitor symptoms today and to go to the ED if he has a recurrence or develops symptoms like prior angina. I will call in NTG to his Pharmacy. Otherwise, will have him scheduled for follow up next week. Richardson Dopp, PA-C   03/19/2013 1:34 PM

## 2013-03-23 ENCOUNTER — Encounter: Payer: Self-pay | Admitting: Physician Assistant

## 2013-03-23 ENCOUNTER — Ambulatory Visit (INDEPENDENT_AMBULATORY_CARE_PROVIDER_SITE_OTHER): Payer: Medicare Other | Admitting: Physician Assistant

## 2013-03-23 VITALS — BP 119/68 | HR 67 | Ht 70.0 in | Wt 197.0 lb

## 2013-03-23 DIAGNOSIS — J45909 Unspecified asthma, uncomplicated: Secondary | ICD-10-CM

## 2013-03-23 DIAGNOSIS — J4530 Mild persistent asthma, uncomplicated: Secondary | ICD-10-CM

## 2013-03-23 DIAGNOSIS — I2510 Atherosclerotic heart disease of native coronary artery without angina pectoris: Secondary | ICD-10-CM

## 2013-03-23 DIAGNOSIS — I10 Essential (primary) hypertension: Secondary | ICD-10-CM

## 2013-03-23 DIAGNOSIS — I219 Acute myocardial infarction, unspecified: Secondary | ICD-10-CM

## 2013-03-23 DIAGNOSIS — E785 Hyperlipidemia, unspecified: Secondary | ICD-10-CM

## 2013-03-23 DIAGNOSIS — R079 Chest pain, unspecified: Secondary | ICD-10-CM

## 2013-03-23 NOTE — Patient Instructions (Signed)
Your physician recommends that you continue on your current medications as directed. Please refer to the Current Medication list given to you today.  Your physician has requested that you have en exercise stress myoview. For further information please visit [email or web address]. Please follow instruction sheet, as given.  Your physician wants you to follow-up in: [place]. Aundra Dubin. You will receive a reminder letter in the mail two months in advance. If you don't receive a letter, please call our office to schedule the follow-up appointment.

## 2013-03-23 NOTE — Progress Notes (Signed)
[address] [place], [place]  [address] Phone: [phone] Fax:  [phone]  Date:  03/23/2013   ID:  Manuel [name], DOB 1928-03-26, MRN [id number]  PCP:  Scarlette Calico, MD  Cardiologist:  Dr. [name]      History of Present Illness: Manuel [name] is a 80 y.o. male with a hx of CAD s/p MI in 1/11, HTN, DM, allergic asthma, and hyperlipidemia. Patient developed bilateral shoulder pain in 1/11 and went to the ER in [place] (where he lived at the time), where he was found to have NSTEMI. LHC showed 99% mid CFX stenosis treated with DES. EF was preserved.   He called the answering service over the weekend with left-sided chest discomfort. This was very brief. He had 2 episodes. He denies exertional symptoms. He denies shortness of breath. Denies orthopnea, PND or significant pedal edema. He denies syncope.  He has not had a recurrence of chest discomfort.  Recent Labs: 05/17/2012: HDL Cholesterol 41.50; Hemoglobin 13.9; LDL (calc) 69; TSH 2.10  09/21/2012: ALT 49; Creatinine 0.9; Potassium 4.4   Wt Readings from Last 3 Encounters:  03/23/13 197 lb (89.359 kg)  09/21/12 194 lb (87.998 kg)  05/17/12 199 lb 8 oz (90.493 kg)     Past Medical History  Diagnosis Date  . Pneumonia     age 70  . MI (myocardial infarction) 02/2009  . GERD (gastroesophageal reflux disease)   . Allergic rhinitis     childhood  . 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 Diabetes mellitus   . Hyperlipidemia   . Chronic kidney disease     stones  . CAD (coronary artery disease)     s/p MI in 1/11. LHC (1/11) with 99% mCFX treated with 2.5 x 16 Xience V DES; 50% mLAD; EF 55%. ETT-myoview (10/12): 4'51", no significant ST segment changes, EF 56%, no ischemia or infarction.  . Asthma     Allergic component  . Hypertension   . Dizziness     Holter (10/12): Frequent PACs and PVCs. No significant bradycardia.     Current Outpatient Prescriptions  Medication Sig Dispense Refill  . aspirin EC 81 MG EC  tablet Take 1 tablet (81 mg total) by mouth daily.      [name] atorvastatin (LIPITOR) 40 MG tablet TAKE 1 TABLET BY MOUTH EVERY DAY  90 tablet  3  . budesonide-formoterol (SYMBICORT) 160-4.5 MCG/ACT inhaler Inhale 2 puffs into the lungs 2 (two) times daily.  3 Inhaler  4  . fish oil-omega-3 fatty acids 1000 MG capsule Take 1 g by mouth daily.       . metFORMIN (GLUCOPHAGE) 500 MG tablet TAKE 2 TABLETS BY MOUTH TWICE A DAY  360 tablet  3  . metoprolol tartrate (LOPRESSOR) 25 MG tablet TAKE 1/2 TABLET TWICE A DAY  90 tablet  3  . Multiple Vitamin (MULTIVITAMIN) tablet Take 1 tablet by mouth daily.      [name] NEXIUM 40 MG capsule TAKE ONE CAPSULE BY MOUTH EVERY DAY  90 capsule  3  . nitroGLYCERIN (NITROSTAT) 0.4 MG SL tablet Place 1 tablet (0.4 mg total) under the tongue every 5 (five) minutes as needed for chest pain.  90 tablet  3  . olmesartan-hydrochlorothiazide (BENICAR HCT) 40-12.5 MG per tablet TAKE 1 TABLET BY MOUTH DAILY.  90 tablet  3  . promethazine (PHENERGAN) 12.5 MG tablet Take 1 tablet (12.5 mg total) by mouth every 6 (six) hours as  needed for nausea.  15 tablet  0   No current facility-administered medications for this visit.    Allergies:   Review of patient's allergies indicates no known allergies.   Social History:  The patient  reports that he quit smoking about 46 years ago. His smoking use included Cigarettes. He smoked 0.00 packs per day. He has never used smokeless tobacco. He reports that he does not drink alcohol or use illicit drugs.   Family History:  The patient's family history is negative for Cancer.   ROS:  Please see the history of present illness.      All other systems reviewed and negative.   PHYSICAL EXAM: VS:  BP 119/68  Pulse 67  Ht 5' 10"  (1.778 m)  Wt 197 lb (89.359 kg)  BMI 28.27 kg/m2 Well nourished, well developed, in no acute distress HEENT: normal Neck: no JVD Vascular: No carotid bruits Cardiac:  normal S1, S2; RRR; no murmur Lungs:  Decreased  breath sounds with faint inspiratory wheezes bilaterally Abd: soft, nontender, no hepatomegaly Ext: trace bilateral edema Skin: warm and dry Neuro:  CNs 2-12 intact, no focal abnormalities noted  EKG:  NSR, HR 67, LAD, nonspecific ST-T wave changes, no significant change when compared to prior tracings     ASSESSMENT AND PLAN:  1. Chest Pain: Symptoms of chest discomfort are atypical. However, he does not typically have chest symptoms. It has been over 2 years since his last stress test. I have recommended proceeding with an ETT-Myoview. If this is low risk, no further workup. 2. CAD, s/p Prior MI: Proceed with stress testing as noted. Continue aspirin, statin, beta blocker 3. Hypertension: Controlled. 4. Hyperlipidemia: Continue statin. Managed by primary care. 5. Asthma: Continue current regimen. Follow up with primary care as indicated. 6. Disposition: Follow up with Dr. Aundra Dubin in 6 months or sooner if stress test is significantly abnormal.  Signed, Richardson Dopp, PA-C  03/23/2013 12:51 PM

## 2013-03-25 ENCOUNTER — Ambulatory Visit: Payer: Medicare Other | Admitting: Physician Assistant

## 2013-04-05 ENCOUNTER — Ambulatory Visit (HOSPITAL_COMMUNITY): Payer: Medicare Other | Attending: Internal Medicine | Admitting: Radiology

## 2013-04-05 VITALS — BP 137/77 | HR 57 | Ht 70.0 in | Wt 195.0 lb

## 2013-04-05 DIAGNOSIS — I10 Essential (primary) hypertension: Secondary | ICD-10-CM | POA: Insufficient documentation

## 2013-04-05 DIAGNOSIS — J4489 Other specified chronic obstructive pulmonary disease: Secondary | ICD-10-CM | POA: Insufficient documentation

## 2013-04-05 DIAGNOSIS — E119 Type 2 diabetes mellitus without complications: Secondary | ICD-10-CM | POA: Insufficient documentation

## 2013-04-05 DIAGNOSIS — I252 Old myocardial infarction: Secondary | ICD-10-CM | POA: Insufficient documentation

## 2013-04-05 DIAGNOSIS — R0789 Other chest pain: Secondary | ICD-10-CM | POA: Insufficient documentation

## 2013-04-05 DIAGNOSIS — E785 Hyperlipidemia, unspecified: Secondary | ICD-10-CM | POA: Insufficient documentation

## 2013-04-05 DIAGNOSIS — Z8249 Family history of ischemic heart disease and other diseases of the circulatory system: Secondary | ICD-10-CM | POA: Diagnosis not present

## 2013-04-05 DIAGNOSIS — R079 Chest pain, unspecified: Secondary | ICD-10-CM | POA: Diagnosis not present

## 2013-04-05 DIAGNOSIS — Z87891 Personal history of nicotine dependence: Secondary | ICD-10-CM | POA: Insufficient documentation

## 2013-04-05 DIAGNOSIS — I2510 Atherosclerotic heart disease of native coronary artery without angina pectoris: Secondary | ICD-10-CM

## 2013-04-05 DIAGNOSIS — J449 Chronic obstructive pulmonary disease, unspecified: Secondary | ICD-10-CM | POA: Diagnosis not present

## 2013-04-05 MED ORDER — TECHNETIUM TC 99M SESTAMIBI GENERIC - CARDIOLITE
10.0000 | Freq: Once | INTRAVENOUS | Status: AC | PRN
  Administered 2013-04-05: 10 via INTRAVENOUS

## 2013-04-05 MED ORDER — TECHNETIUM TC 99M SESTAMIBI GENERIC - CARDIOLITE
30.0000 | Freq: Once | INTRAVENOUS | Status: AC | PRN
  Administered 2013-04-05: 30 via INTRAVENOUS

## 2013-04-05 NOTE — Progress Notes (Signed)
[facility] [address] [place], [place] [address] [phone]    Cardiology Nuclear Med Study  EDVARDO HONSE is a 80 y.o. male     MRN : [id number]     DOB: 1928/05/24  Procedure Date: 04/05/2013  Nuclear Med Background Indication for Stress Test:  Evaluation for Ischemia and Stent Patency History:  CAD, MI 2011, Cath 2011, Stent (mid Cfx), MPI 2012 (normal) EF 56%, Asthma, COPD Cardiac Risk Factors: Family History - CAD, History of Smoking, Hypertension, Lipids and NIDDM  Symptoms:  Chest Pain (last date of chest discomfort was two week ago)   Nuclear Pre-Procedure Caffeine/Decaff Intake:  None > 12 hrs NPO After: 5:30pm   Lungs:  clear O2 Sat: 94% on room air. IV 0.9% NS with Angio Cath:  22g  IV Site: R Wrist x 1, tolerated well IV Started by:  Irven Baltimore, RN  Chest Size (in):  46 Cup Size: n/a  Height: 5' 10"  (1.778 m)  Weight:  195 lb (88.451 kg)  BMI:  Body mass index is 27.98 kg/(m^2). Tech Comments:  Held Lopressor x 24 hrs    Nuclear Med Study 1 or 2 day study: 1 day  Stress Test Type:  Stress  Reading MD: N/A  Order Authorizing Provider:  [name], MD, and Richardson Dopp, PAC  Resting Radionuclide: Technetium 16mSestamibi  Resting Radionuclide Dose: 11.0 mCi   Stress Radionuclide:  Technetium 92mestamibi  Stress Radionuclide Dose: 33.0 mCi           Stress Protocol Rest HR: 57 Stress HR: 144  Rest BP: 137/77 Stress BP: 169/94  Exercise Time (min): 4:15 METS: 5.5           Dose of Adenosine (mg):  n/a Dose of Lexiscan: n/a mg  Dose of Atropine (mg): n/a Dose of Dobutamine: n/a mcg/kg/min (at max HR)  Stress Test Technologist: ShGlade LloydBS-ES  Nuclear Technologist:  StCharlton AmorCNMT     Rest Procedure:  Myocardial perfusion imaging was performed at rest 45 minutes following the intravenous administration of Technetium 9944mstamibi. Rest ECG: NSR with non-specific ST-T wave changes  Stress Procedure:  The  patient exercised on the treadmill utilizing the Bruce Protocol for 4:15 minutes. The patient stopped due to fatigue and denied any chest pain.  Technetium 71m76mtamibi was injected at peak exercise and myocardial perfusion imaging was performed after a brief delay. Stress ECG: No significant change from baseline ECG  QPS Raw Data Images:  Normal; no motion artifact; normal heart/lung ratio. Stress Images:  Decreased uptake inferior base Rest Images:  Normal homogeneous uptake in all areas of the myocardium. Subtraction (SDS):  inferior basal redistribution only on short axis images Transient Ischemic Dilatation (Normal <1.22):  0.94 Lung/Heart Ratio (Normal <0.45):  0.40  Quantitative Gated Spect Images QGS EDV:  75 ml QGS ESV:  24 ml  Impression Exercise Capacity:  Fair exercise capacity. BP Response:  Normal blood pressure response. Clinical Symptoms:  Fatigue ECG Impression:  No significant ST segment change suggestive of ischemia. Comparison with Prior Nuclear Study: No images to compare  Overall Impression:  Low risk stress nuclear study Small area of redistribution seen at inferior base only on short axis images.  SDS only 4 Not thought to be signficiant.  LV Ejection Fraction: 68%.  LV Wall Motion:  Normal Wall Motion  Manuel [name] [place]

## 2013-04-06 ENCOUNTER — Encounter: Payer: Self-pay | Admitting: Physician Assistant

## 2013-05-09 ENCOUNTER — Telehealth: Payer: Self-pay | Admitting: Internal Medicine

## 2013-05-09 NOTE — Telephone Encounter (Signed)
Patient is needing an rx for shingles vac. . . Would like to pick up hard copy.

## 2013-05-09 NOTE — Telephone Encounter (Signed)
He is due for a visit I will address these needs during a follow up visit

## 2013-05-09 NOTE — Telephone Encounter (Signed)
Also, would like a call about possibly a referral to ophthalmologist.

## 2013-05-12 NOTE — Telephone Encounter (Signed)
Pts wife called states the Zostavax Rx is no longer needed.

## 2013-05-30 ENCOUNTER — Encounter: Payer: Self-pay | Admitting: Internal Medicine

## 2013-05-30 ENCOUNTER — Telehealth: Payer: Self-pay | Admitting: Internal Medicine

## 2013-05-30 ENCOUNTER — Ambulatory Visit (INDEPENDENT_AMBULATORY_CARE_PROVIDER_SITE_OTHER): Payer: Medicare Other | Admitting: Internal Medicine

## 2013-05-30 ENCOUNTER — Other Ambulatory Visit (INDEPENDENT_AMBULATORY_CARE_PROVIDER_SITE_OTHER): Payer: Medicare Other

## 2013-05-30 VITALS — BP 128/84 | HR 72 | Temp 98.8°F | Resp 16 | Ht 70.0 in | Wt 199.0 lb

## 2013-05-30 DIAGNOSIS — E1149 Type 2 diabetes mellitus with other diabetic neurological complication: Secondary | ICD-10-CM

## 2013-05-30 DIAGNOSIS — I2510 Atherosclerotic heart disease of native coronary artery without angina pectoris: Secondary | ICD-10-CM

## 2013-05-30 DIAGNOSIS — I10 Essential (primary) hypertension: Secondary | ICD-10-CM | POA: Diagnosis not present

## 2013-05-30 DIAGNOSIS — E785 Hyperlipidemia, unspecified: Secondary | ICD-10-CM | POA: Diagnosis not present

## 2013-05-30 DIAGNOSIS — Z0000 Encounter for general adult medical examination without abnormal findings: Secondary | ICD-10-CM

## 2013-05-30 LAB — LIPID PANEL
Cholesterol: 163 mg/dL (ref 0–200)
HDL: 51 mg/dL (ref >39.00)
LDL Cholesterol: 94 mg/dL (ref 0–99)
Total CHOL/HDL Ratio: 3
Triglycerides: 88 mg/dL (ref 0.0–149.0)
VLDL: 17.6 mg/dL (ref 0.0–40.0)

## 2013-05-30 LAB — CBC WITH DIFFERENTIAL/PLATELET
BASOS PCT: 1.1 % (ref 0.0–3.0)
Basophils Absolute: 0.1 10*3/uL (ref 0.0–0.1)
EOS PCT: 10.8 % — AB (ref 0.0–5.0)
Eosinophils Absolute: 0.5 10*3/uL (ref 0.0–0.7)
HEMATOCRIT: 42 % (ref 39.0–52.0)
HEMOGLOBIN: 14 g/dL (ref 13.0–17.0)
LYMPHS ABS: 1.5 10*3/uL (ref 0.7–4.0)
Lymphocytes Relative: 29.7 % (ref 12.0–46.0)
MCHC: 33.4 g/dL (ref 30.0–36.0)
MCV: 94.5 fl (ref 78.0–100.0)
MONO ABS: 0.5 10*3/uL (ref 0.1–1.0)
Monocytes Relative: 10.3 % (ref 3.0–12.0)
NEUTROS ABS: 2.4 10*3/uL (ref 1.4–7.7)
Neutrophils Relative %: 48.1 % (ref 43.0–77.0)
Platelets: 171 10*3/uL (ref 150.0–400.0)
RBC: 4.44 Mil/uL (ref 4.22–5.81)
RDW: 14 % (ref 11.5–14.6)
WBC: 5.1 10*3/uL (ref 4.5–10.5)

## 2013-05-30 LAB — COMPREHENSIVE METABOLIC PANEL
ALK PHOS: 70 U/L (ref 39–117)
ALT: 56 U/L — ABNORMAL HIGH (ref 0–53)
AST: 59 U/L — ABNORMAL HIGH (ref 0–37)
Albumin: 4 g/dL (ref 3.5–5.2)
BUN: 20 mg/dL (ref 6–23)
CO2: 32 mEq/L (ref 19–32)
CREATININE: 0.9 mg/dL (ref 0.4–1.5)
Calcium: 9.4 mg/dL (ref 8.4–10.5)
Chloride: 98 mEq/L (ref 96–112)
GFR: 82.45 mL/min (ref >60.00)
GLUCOSE: 121 mg/dL — AB (ref 70–99)
Potassium: 4.3 mEq/L (ref 3.5–5.1)
Sodium: 140 mEq/L (ref 135–145)
Total Bilirubin: 0.8 mg/dL (ref 0.3–1.2)
Total Protein: 7.6 g/dL (ref 6.0–8.3)

## 2013-05-30 LAB — URINALYSIS, ROUTINE W REFLEX MICROSCOPIC
BILIRUBIN URINE: NEGATIVE
HGB URINE DIPSTICK: NEGATIVE
Ketones, ur: NEGATIVE
Leukocytes, UA: NEGATIVE
Nitrite: NEGATIVE
RBC / HPF: NONE SEEN (ref 0–?)
Specific Gravity, Urine: 1.02 (ref 1.000–1.030)
TOTAL PROTEIN, URINE-UPE24: NEGATIVE
Urine Glucose: NEGATIVE
Urobilinogen, UA: 0.2 (ref 0.0–1.0)
pH: 6 (ref 5.0–8.0)

## 2013-05-30 LAB — FECAL OCCULT BLOOD, GUAIAC: FECAL OCCULT BLD: NEGATIVE

## 2013-05-30 LAB — TSH: TSH: 1.89 u[IU]/mL (ref 0.35–5.50)

## 2013-05-30 LAB — HEMOGLOBIN A1C: Hgb A1c MFr Bld: 6.8 % — ABNORMAL HIGH (ref 4.6–6.5)

## 2013-05-30 MED ORDER — IRBESARTAN-HYDROCHLOROTHIAZIDE 300-12.5 MG PO TABS: 1.0000 | ORAL_TABLET | Freq: Every day | ORAL | Status: DC

## 2013-05-30 NOTE — Assessment & Plan Note (Signed)
FLP today He is doing well on lipitor 

## 2013-05-30 NOTE — Telephone Encounter (Signed)
Yes, he was referred to an eye doctor today

## 2013-05-30 NOTE — Patient Instructions (Signed)
Health Maintenance, Males A healthy lifestyle and preventative care can promote health and wellness.  Maintain regular health, dental, and eye exams.  Eat a healthy diet. Foods like vegetables, fruits, whole grains, low-fat dairy products, and lean protein foods contain the nutrients you need and are low in calories. Decrease your intake of foods high in solid fats, added sugars, and salt. Get information about a proper diet from your health care provider, if necessary.  Regular physical exercise is one of the most important things you can do for your health. Most adults should get at least 150 minutes of moderate-intensity exercise (any activity that increases your heart rate and causes you to sweat) each week. In addition, most adults need muscle-strengthening exercises on 2 or more days a week.   Maintain a healthy weight. The body mass index (BMI) is a screening tool to identify possible weight problems. It provides an estimate of body fat based on height and weight. Your health care provider can find your BMI and can help you achieve or maintain a healthy weight. For males 20 years and older:  A BMI below 18.5 is considered underweight.  A BMI of 18.5 to 24.9 is normal.  A BMI of 25 to 29.9 is considered overweight.  A BMI of 30 and above is considered obese.  Maintain normal blood lipids and cholesterol by exercising and minimizing your intake of saturated fat. Eat a balanced diet with plenty of fruits and vegetables. Blood tests for lipids and cholesterol should begin at age 20 and be repeated every 5 years. If your lipid or cholesterol levels are high, you are over 50, or you are at high risk for heart disease, you may need your cholesterol levels checked more frequently.Ongoing high lipid and cholesterol levels should be treated with medicines, if diet and exercise are not working.  If you smoke, find out from your health care provider how to quit. If you do not use tobacco, do not  start.  Lung cancer screening is recommended for adults aged 55 80 years who are at high risk for developing lung cancer because of a history of smoking. A yearly low-dose CT scan of the lungs is recommended for people who have at least a 30-pack-year history of smoking and are a current smoker or have quit within the past 15 years. A pack year of smoking is smoking an average of 1 pack of cigarettes a day for 1 year (for example, a 30-pack-year history of smoking could mean smoking 1 pack a day for 30 years or 2 packs a day for 15 years). Yearly screening should continue until the smoker has stopped smoking for at least 15 years. Yearly screening should be stopped for people who develop a health problem that would prevent them from having lung cancer treatment.  If you choose to drink alcohol, do not have more than 2 drinks per day. One drink is considered to be 12 oz (360 mL) of beer, 5 oz (150 mL) of wine, or 1.5 oz (45 mL) of liquor.  Avoid use of street drugs. Do not share needles with anyone. Ask for help if you need support or instructions about stopping the use of drugs.  High blood pressure causes heart disease and increases the risk of stroke. Blood pressure should be checked at least every 1 2 years. Ongoing high blood pressure should be treated with medicines if weight loss and exercise are not effective.  If you are 45 79 years old, ask your health   care provider if you should take aspirin to prevent heart disease.  Diabetes screening involves taking a blood sample to check your fasting blood sugar level. This should be done once every 3 years after age 45, if you are at a normal weight and without risk factors for diabetes. Testing should be considered at a younger age or be carried out more frequently if you are overweight and have at least 1 risk factor for diabetes.  Colorectal cancer can be detected and often prevented. Most routine colorectal cancer screening begins at the age of 50  and continues through age 75. However, your health care provider may recommend screening at an earlier age if you have risk factors for colon cancer. On a yearly basis, your health care provider may provide home test kits to check for hidden blood in the stool. A small camera at the end of a tube may be used to directly examine the colon (sigmoidoscopy or colonoscopy) to detect the earliest forms of colorectal cancer. Talk to your health care provider about this at age 50, when routine screening begins. A direct exam of the colon should be repeated every 5 10 years through age 75, unless early forms of pre-cancerous polyps or small growths are found.  People who are at an increased risk for hepatitis B should be screened for this virus. You are considered at high risk for hepatitis B if:  You were born in a country where hepatitis B occurs often. Talk with your health care provider about which countries are considered high-risk.  Your parents were born in a high-risk country and you have not received a shot to protect against hepatitis B (hepatitis B vaccine).  You have HIV or AIDS.  You use needles to inject street drugs.  You live with, or have sex with, someone who has hepatitis B.  You are a man who has sex with other men (MSM).  You get hemodialysis treatment.  You take certain medicines for conditions like cancer, organ transplantation, and autoimmune conditions.  Hepatitis C blood testing is recommended for all people born from 1945 through 1965 and any individual with known risk factors for hepatitis C.  Healthy men should no longer receive prostate-specific antigen (PSA) blood tests as part of routine cancer screening. Talk to your health care provider about prostate cancer screening.  Testicular cancer screening is not recommended for adolescents or adult males who have no symptoms. Screening includes self-exam, a health care provider exam, and other screening tests. Consult with  your health care provider about any symptoms you have or any concerns you have about testicular cancer.  Practice safe sex. Use condoms and avoid high-risk sexual practices to reduce the spread of sexually transmitted infections (STIs).  Use sunscreen. Apply sunscreen liberally and repeatedly throughout the day. You should seek shade when your shadow is shorter than you. Protect yourself by wearing long sleeves, pants, a wide-brimmed hat, and sunglasses year round, whenever you are outdoors.  Tell your health care provider of new moles or changes in moles, especially if there is a change in shape or color. Also tell your provider if a mole is larger than the size of a pencil eraser.  A one-time screening for abdominal aortic aneurysm (AAA) and surgical repair of large AAAs by ultrasound is recommended for men aged 65 75 years who are current or former smokers.  Stay current with your vaccines (immunizations). Document Released: 08/09/2007 Document Revised: 12/01/2012 Document Reviewed: 07/08/2010 [employer] Patient Information 2014 [employer].   Type 2 Diabetes Mellitus, Adult Type 2 diabetes mellitus, often simply referred to as type 2 diabetes, is a long-lasting (chronic) disease. In type 2 diabetes, the pancreas does not make enough insulin (a hormone), the cells are less responsive to the insulin that is made (insulin resistance), or both. Normally, insulin moves sugars from food into the tissue cells. The tissue cells use the sugars for energy. The lack of insulin or the lack of normal response to insulin causes excess sugars to build up in the blood instead of going into the tissue cells. As a result, high blood sugar (hyperglycemia) develops. The effect of high sugar (glucose) levels can cause many complications. Type 2 diabetes was also previously called adult-onset diabetes but it can occur at any age.  RISK FACTORS  A person is predisposed to developing type 2 diabetes if someone in  the family has the disease and also has one or more of the following primary risk factors:  Overweight.  An inactive lifestyle.  A history of consistently eating high-calorie foods. Maintaining a normal weight and regular physical activity can reduce the chance of developing type 2 diabetes. SYMPTOMS  A person with type 2 diabetes may not show symptoms initially. The symptoms of type 2 diabetes appear slowly. The symptoms include:  Increased thirst (polydipsia).  Increased urination (polyuria).  Increased urination during the night (nocturia).  Weight loss. This weight loss may be rapid.  Frequent, recurring infections.  Tiredness (fatigue).  Weakness.  Vision changes, such as blurred vision.  Fruity smell to your breath.  Abdominal pain.  Nausea or vomiting.  Cuts or bruises which are slow to heal.  Tingling or numbness in the hands or feet. DIAGNOSIS Type 2 diabetes is frequently not diagnosed until complications of diabetes are present. Type 2 diabetes is diagnosed when symptoms or complications are present and when blood glucose levels are increased. Your blood glucose level may be checked by one or more of the following blood tests:  A fasting blood glucose test. You will not be allowed to eat for at least 8 hours before a blood sample is taken.  A random blood glucose test. Your blood glucose is checked at any time of the day regardless of when you ate.  A hemoglobin A1c blood glucose test. A hemoglobin A1c test provides information about blood glucose control over the previous 3 months.  An oral glucose tolerance test (OGTT). Your blood glucose is measured after you have not eaten (fasted) for 2 hours and then after you drink a glucose-containing beverage. TREATMENT   You may need to take insulin or diabetes medicine daily to keep blood glucose levels in the desired range.  You will need to match insulin dosing with exercise and healthy food choices. The  treatment goal is to maintain the before meal blood sugar (preprandial glucose) level at 70 130 mg/dL. HOME CARE INSTRUCTIONS   Have your hemoglobin A1c level checked twice a year.  Perform daily blood glucose monitoring as directed by your caregiver.  Monitor urine ketones when you are ill and as directed by your caregiver.  Take your diabetes medicine or insulin as directed by your caregiver to maintain your blood glucose levels in the desired range.  Never run out of diabetes medicine or insulin. It is needed every day.  Adjust insulin based on your intake of carbohydrates. Carbohydrates can raise blood glucose levels but need to be included in your diet. Carbohydrates provide vitamins, minerals, and fiber which are an essential part   of a healthy diet. Carbohydrates are found in fruits, vegetables, whole grains, dairy products, legumes, and foods containing added sugars.    Eat healthy foods. Alternate 3 meals with 3 snacks.  Lose weight if overweight.  Carry a medical alert card or wear your medical alert jewelry.  Carry a 15 gram carbohydrate snack with you at all times to treat low blood glucose (hypoglycemia). Some examples of 15 gram carbohydrate snacks include:  Glucose tablets, 3 or 4   Glucose gel, 15 gram tube  Raisins, 2 tablespoons (24 grams)  Jelly beans, 6  Animal crackers, 8  Regular pop, 4 ounces (120 mL)  Gummy treats, 9  Recognize hypoglycemia. Hypoglycemia occurs with blood glucose levels of 70 mg/dL and below. The risk for hypoglycemia increases when fasting or skipping meals, during or after intense exercise, and during sleep. Hypoglycemia symptoms can include:  Tremors or shakes.  Decreased ability to concentrate.  Sweating.  Increased heart rate.  Headache.  Dry mouth.  Hunger.  Irritability.  Anxiety.  Restless sleep.  Altered speech or coordination.  Confusion.  Treat hypoglycemia promptly. If you are alert and able to  safely swallow, follow the 15:15 rule:  Take 15 20 grams of rapid-acting glucose or carbohydrate. Rapid-acting options include glucose gel, glucose tablets, or 4 ounces (120 mL) of fruit juice, regular soda, or low fat milk.  Check your blood glucose level 15 minutes after taking the glucose.  Take 15 20 grams more of glucose if the repeat blood glucose level is still 70 mg/dL or below.  Eat a meal or snack within 1 hour once blood glucose levels return to normal.    Be alert to polyuria and polydipsia which are early signs of hyperglycemia. An early awareness of hyperglycemia allows for prompt treatment. Treat hyperglycemia as directed by your caregiver.  Engage in at least 150 minutes of moderate-intensity physical activity a week, spread over at least 3 days of the week or as directed by your caregiver. In addition, you should engage in resistance exercise at least 2 times a week or as directed by your caregiver.  Adjust your medicine and food intake as needed if you start a new exercise or sport.  Follow your sick day plan at any time you are unable to eat or drink as usual.  Avoid tobacco use.  Limit alcohol intake to no more than 1 drink per day for nonpregnant women and 2 drinks per day for men. You should drink alcohol only when you are also eating food. Talk with your caregiver whether alcohol is safe for you. Tell your caregiver if you drink alcohol several times a week.  Follow up with your caregiver regularly.  Schedule an eye exam soon after the diagnosis of type 2 diabetes and then annually.  Perform daily skin and foot care. Examine your skin and feet daily for cuts, bruises, redness, nail problems, bleeding, blisters, or sores. A foot exam by a caregiver should be done annually.  Brush your teeth and gums at least twice a day and floss at least once a day. Follow up with your dentist regularly.  Share your diabetes management plan with your workplace or  school.  Stay up-to-date with immunizations.  Learn to manage stress.  Obtain ongoing diabetes education and support as needed.  Participate in, or seek rehabilitation as needed to maintain or improve independence and quality of life. Request a physical or occupational therapy referral if you are having foot or hand numbness or difficulties with   grooming, dressing, eating, or physical activity. SEEK MEDICAL CARE IF:   You are unable to eat food or drink fluids for more than 6 hours.  You have nausea and vomiting for more than 6 hours.  Your blood glucose level is over 240 mg/dL.  There is a change in mental status.  You develop an additional serious illness.  You have diarrhea for more than 6 hours.  You have been sick or have had a fever for a couple of days and are not getting better.  You have pain during any physical activity.  SEEK IMMEDIATE MEDICAL CARE IF:  You have difficulty breathing.  You have moderate to large ketone levels. MAKE SURE YOU:  Understand these instructions.  Will watch your condition.  Will get help right away if you are not doing well or get worse. Document Released: 02/10/2005 Document Revised: 11/05/2011 Document Reviewed: 09/09/2011 [employer] Patient Information 2014 [employer].  

## 2013-05-30 NOTE — Assessment & Plan Note (Signed)
His BP is well controlled Benicar was changed to avalide to satisfy his formulary I will check his lytes and renal function today

## 2013-05-30 NOTE — Assessment & Plan Note (Addendum)

## 2013-05-30 NOTE — Telephone Encounter (Signed)
Pt forgot to ask if Dr. Ronnald Ramp thinks he need to see the opthalmologist for watery eyes. Please advise. Pt does not have opthamologist right now.

## 2013-05-30 NOTE — Progress Notes (Signed)
Subjective:    Patient ID: Manuel [name], male    DOB: 1928-06-29, 80 y.o.   MRN: [id number]  Diabetes He presents for his follow-up diabetic visit. He has type 2 diabetes mellitus. His disease course has been stable. There are no hypoglycemic associated symptoms. Pertinent negatives for hypoglycemia include no dizziness or headaches. Pertinent negatives for diabetes include no blurred vision, no chest pain, no fatigue, no foot paresthesias, no foot ulcerations, no polydipsia, no polyphagia, no polyuria, no visual change, no weakness and no weight loss. There are no hypoglycemic complications. Diabetic complications include heart disease. Current diabetic treatment includes oral agent (monotherapy). He is compliant with treatment all of the time. His weight is stable. He is following a generally healthy diet. Meal planning includes avoidance of concentrated sweets. He has not had a previous visit with a dietician. He participates in exercise intermittently. An ACE inhibitor/angiotensin II receptor blocker is being taken. He does not see a podiatrist.Eye exam is current.      Review of Systems  Constitutional: Negative.  Negative for fever, chills, weight loss, diaphoresis, appetite change and fatigue.  HENT: Negative.   Eyes: Negative.  Negative for blurred vision.  Respiratory: Negative.  Negative for apnea, cough, choking, chest tightness, shortness of breath, wheezing and stridor.   Cardiovascular: Negative.  Negative for chest pain, palpitations and leg swelling.  Gastrointestinal: Negative.  Negative for nausea, vomiting, abdominal pain, diarrhea and constipation.  Endocrine: Negative.  Negative for polydipsia, polyphagia and polyuria.  Genitourinary: Negative.   Musculoskeletal: Negative.  Negative for arthralgias, back pain, myalgias, neck pain and neck stiffness.  Skin: Negative.   Allergic/Immunologic: Negative.   Neurological: Negative.  Negative for dizziness, weakness,  light-headedness and headaches.  Hematological: Negative.  Negative for adenopathy.  Psychiatric/Behavioral: Negative.        Objective:   Physical Exam  Vitals reviewed. Constitutional: He is oriented to person, place, and time. He appears well-developed and well-nourished. No distress.  HENT:  Head: Normocephalic and atraumatic.  Mouth/Throat: Oropharynx is clear and moist. No oropharyngeal exudate.  Eyes: Conjunctivae are normal. Right eye exhibits no discharge. Left eye exhibits no discharge. No scleral icterus.  Neck: Normal range of motion. Neck supple. No JVD present. No tracheal deviation present. No thyromegaly present.  Cardiovascular: Normal rate, regular rhythm, normal heart sounds and intact distal pulses.  Exam reveals no gallop and no friction rub.   No murmur heard. Pulmonary/Chest: Effort normal and breath sounds normal. No stridor. No respiratory distress. He has no wheezes. He has no rales. He exhibits no tenderness.  Abdominal: Soft. Bowel sounds are normal. He exhibits no distension and no mass. There is no tenderness. There is no rebound and no guarding. Hernia confirmed negative in the right inguinal area and confirmed negative in the left inguinal area.  Genitourinary: Rectum normal, prostate normal, testes normal and penis normal. Rectal exam shows no external hemorrhoid, no internal hemorrhoid, no fissure, no mass, no tenderness and anal tone normal. Guaiac negative stool. Prostate is not enlarged and not tender. Right testis shows no mass, no swelling and no tenderness. Right testis is descended. Left testis shows no mass, no swelling and no tenderness. Left testis is descended. Circumcised. No penile erythema or penile tenderness. No discharge found.  Musculoskeletal: Normal range of motion. He exhibits no edema and no tenderness.  Lymphadenopathy:    He has no cervical adenopathy.       Right: No inguinal adenopathy present.  Left: No inguinal adenopathy  present.  Neurological: He is oriented to person, place, and time.  Skin: Skin is warm and dry. No rash noted. He is not diaphoretic. No erythema. No pallor.  Psychiatric: He has a normal mood and affect. His behavior is normal. Judgment and thought content normal.     Lab Results  Component Value Date   WBC 5.5 05/17/2012   HGB 13.9 05/17/2012   HCT 41.5 05/17/2012   PLT 184.0 05/17/2012   GLUCOSE 102* 09/21/2012   CHOL 134 05/17/2012   TRIG 119.0 05/17/2012   HDL 41.50 05/17/2012   LDLCALC 69 05/17/2012   ALT 49 09/21/2012   AST 49* 09/21/2012   NA 139 09/21/2012   K 4.4 09/21/2012   CL 99 09/21/2012   CREATININE 0.9 09/21/2012   BUN 17 09/21/2012   CO2 34* 09/21/2012   TSH 2.10 05/17/2012   HGBA1C 6.7* 09/21/2012       Assessment & Plan:

## 2013-05-30 NOTE — Assessment & Plan Note (Signed)
He is doing well on metformin I will recheck his A1C and will monitor his renal function today

## 2013-06-13 ENCOUNTER — Other Ambulatory Visit: Payer: Self-pay | Admitting: Internal Medicine

## 2013-06-15 ENCOUNTER — Encounter (INDEPENDENT_AMBULATORY_CARE_PROVIDER_SITE_OTHER): Payer: Medicare Other | Admitting: Ophthalmology

## 2013-06-15 DIAGNOSIS — H43819 Vitreous degeneration, unspecified eye: Secondary | ICD-10-CM

## 2013-06-15 DIAGNOSIS — E1165 Type 2 diabetes mellitus with hyperglycemia: Secondary | ICD-10-CM | POA: Diagnosis not present

## 2013-06-15 DIAGNOSIS — E1139 Type 2 diabetes mellitus with other diabetic ophthalmic complication: Secondary | ICD-10-CM

## 2013-06-15 DIAGNOSIS — H33309 Unspecified retinal break, unspecified eye: Secondary | ICD-10-CM | POA: Diagnosis not present

## 2013-06-15 DIAGNOSIS — E11319 Type 2 diabetes mellitus with unspecified diabetic retinopathy without macular edema: Secondary | ICD-10-CM

## 2013-06-15 LAB — HM DIABETES EYE EXAM

## 2013-06-22 ENCOUNTER — Ambulatory Visit (INDEPENDENT_AMBULATORY_CARE_PROVIDER_SITE_OTHER): Payer: Medicare Other | Admitting: Ophthalmology

## 2013-06-22 DIAGNOSIS — H33309 Unspecified retinal break, unspecified eye: Secondary | ICD-10-CM

## 2013-06-26 ENCOUNTER — Other Ambulatory Visit: Payer: Self-pay | Admitting: Internal Medicine

## 2013-07-16 ENCOUNTER — Other Ambulatory Visit: Payer: Self-pay | Admitting: Cardiology

## 2013-07-26 ENCOUNTER — Ambulatory Visit (INDEPENDENT_AMBULATORY_CARE_PROVIDER_SITE_OTHER): Payer: Medicare Other | Admitting: Internal Medicine

## 2013-07-26 ENCOUNTER — Encounter: Payer: Self-pay | Admitting: Internal Medicine

## 2013-07-26 VITALS — BP 108/68 | HR 78 | Temp 98.0°F | Resp 16 | Ht 70.0 in | Wt 199.2 lb

## 2013-07-26 DIAGNOSIS — B353 Tinea pedis: Secondary | ICD-10-CM

## 2013-07-26 DIAGNOSIS — I10 Essential (primary) hypertension: Secondary | ICD-10-CM | POA: Diagnosis not present

## 2013-07-26 DIAGNOSIS — I2510 Atherosclerotic heart disease of native coronary artery without angina pectoris: Secondary | ICD-10-CM

## 2013-07-26 MED ORDER — CICLOPIROX 0.77 % EX GEL: 1.0000 | Freq: Two times a day (BID) | CUTANEOUS | Status: DC

## 2013-07-26 NOTE — Patient Instructions (Signed)
Athlete's Foot Athlete's foot (tinea pedis) is a fungal infection of the skin on the feet. It often occurs on the skin between the toes or underneath the toes. It can also occur on the soles of the feet. Athlete's foot is more likely to occur in hot, humid weather. Not washing your feet or changing your socks often enough can contribute to athlete's foot. The infection can spread from person to person (contagious). CAUSES Athlete's foot is caused by a fungus. This fungus thrives in warm, moist places. Most people get athlete's foot by sharing shower stalls, towels, and wet floors with an infected person. People with weakened immune systems, including those with diabetes, may be more likely to get athlete's foot. SYMPTOMS   Itchy areas between the toes or on the soles of the feet.  White, flaky, or scaly areas between the toes or on the soles of the feet.  Tiny, intensely itchy blisters between the toes or on the soles of the feet.  Tiny cuts on the skin. These cuts can develop a bacterial infection.  Thick or discolored toenails. DIAGNOSIS  Your caregiver can usually tell what the problem is by doing a physical exam. Your caregiver may also take a skin sample from the rash area. The skin sample may be examined under a microscope, or it may be tested to see if fungus will grow in the sample. A sample may also be taken from your toenail for testing. TREATMENT  Over-the-counter and prescription medicines can be used to kill the fungus. These medicines are available as powders or creams. Your caregiver can suggest medicines for you. Fungal infections respond slowly to treatment. You may need to continue using your medicine for several weeks. PREVENTION   Do not share towels.  Wear sandals in wet areas, such as shared locker rooms and shared showers.  Keep your feet dry. Wear shoes that allow air to circulate. Wear cotton or wool socks. HOME CARE INSTRUCTIONS   Take medicines as directed by  your caregiver. Do not use steroid creams on athlete's foot.  Keep your feet clean and cool. Wash your feet daily and dry them thoroughly, especially between your toes.  Change your socks every day. Wear cotton or wool socks. In hot climates, you may need to change your socks 2 to 3 times per day.  Wear sandals or canvas tennis shoes with good air circulation.  If you have blisters, soak your feet in Burow's solution or Epsom salts for 20 to 30 minutes, 2 times a day to dry out the blisters. Make sure you dry your feet thoroughly afterward. SEEK MEDICAL CARE IF:   You have a fever.  You have swelling, soreness, warmth, or redness in your foot.  You are not getting better after 7 days of treatment.  You are not completely cured after 30 days.  You have any problems caused by your medicines. MAKE SURE YOU:   Understand these instructions.  Will watch your condition.  Will get help right away if you are not doing well or get worse. Document Released: 02/08/2000 Document Revised: 05/05/2011 Document Reviewed: 11/29/2010 [facility] Patient Information 2014 [place], [place].

## 2013-07-26 NOTE — Assessment & Plan Note (Signed)
Will treat with loprox

## 2013-07-26 NOTE — Progress Notes (Signed)
   Subjective:    Patient ID: Manuel [name], male    DOB: [birth year], 80 y.o.   MRN: [id number]  Rash This is a recurrent problem. The current episode started more than 1 month ago. The problem has been gradually worsening since onset. The affected locations include the left ankle, left foot, right ankle and right foot. The rash is characterized by redness and scaling. He was exposed to nothing. Pertinent negatives include no anorexia, congestion, cough, diarrhea, eye pain, facial edema, fatigue, fever, joint pain, nail changes, rhinorrhea, shortness of breath, sore throat or vomiting. Past treatments include nothing. The treatment provided no relief.      Review of Systems  Constitutional: Negative for fever and fatigue.  HENT: Negative for congestion, rhinorrhea and sore throat.   Eyes: Negative for pain.  Respiratory: Negative for cough and shortness of breath.   Gastrointestinal: Negative for vomiting, diarrhea and anorexia.  Musculoskeletal: Negative for joint pain.  Skin: Positive for rash. Negative for nail changes.  All other systems reviewed and are negative.      Objective:   Physical Exam  Vitals reviewed. Constitutional: He is oriented to person, place, and time. He appears well-developed and well-nourished. No distress.  HENT:  Head: Normocephalic and atraumatic.  Mouth/Throat: Oropharynx is clear and moist. No oropharyngeal exudate.  Eyes: Conjunctivae are normal. Right eye exhibits no discharge. Left eye exhibits no discharge. No scleral icterus.  Neck: Normal range of motion. Neck supple. No JVD present. No tracheal deviation present. No thyromegaly present.  Cardiovascular: Normal rate, regular rhythm and intact distal pulses.  Exam reveals no gallop and no friction rub.   Murmur heard. Pulmonary/Chest: Effort normal and breath sounds normal. No stridor. No respiratory distress. He has no wheezes. He has no rales. He exhibits no tenderness.  Abdominal: Soft. Bowel  sounds are normal. He exhibits no distension and no mass. There is no tenderness. There is no rebound and no guarding.  Musculoskeletal: Normal range of motion. He exhibits no edema and no tenderness.  Lymphadenopathy:    He has no cervical adenopathy.  Neurological: He is oriented to person, place, and time.  Skin: Skin is warm and dry. Rash noted. No abrasion, no bruising, no burn, no ecchymosis, no laceration, no lesion, no petechiae and no purpura noted. Rash is macular. Rash is not papular, not maculopapular, not nodular, not pustular, not vesicular and not urticarial. He is not diaphoretic. There is erythema. No pallor.  On both feet and ankles there are serpiginous areas of erythema and scale, there is scaling and maceration in some of the web spaces between the toes.          Assessment & Plan:

## 2013-07-26 NOTE — Progress Notes (Signed)
Pre visit review using our clinic review tool, if applicable. No additional management support is needed unless otherwise documented below in the visit note. 

## 2013-07-26 NOTE — Assessment & Plan Note (Signed)
His BP is well controlled 

## 2013-07-27 ENCOUNTER — Telehealth: Payer: Self-pay | Admitting: Internal Medicine

## 2013-07-27 NOTE — Telephone Encounter (Signed)
Relevant patient education assigned to patient using Emmi. ° °

## 2013-08-02 DIAGNOSIS — E119 Type 2 diabetes mellitus without complications: Secondary | ICD-10-CM | POA: Diagnosis not present

## 2013-08-02 DIAGNOSIS — H35359 Cystoid macular degeneration, unspecified eye: Secondary | ICD-10-CM | POA: Diagnosis not present

## 2013-08-02 DIAGNOSIS — H524 Presbyopia: Secondary | ICD-10-CM | POA: Diagnosis not present

## 2013-08-02 DIAGNOSIS — H5200 Hypermetropia, unspecified eye: Secondary | ICD-10-CM | POA: Diagnosis not present

## 2013-08-02 LAB — HM DIABETES EYE EXAM

## 2013-08-16 DIAGNOSIS — H04129 Dry eye syndrome of unspecified lacrimal gland: Secondary | ICD-10-CM | POA: Diagnosis not present

## 2013-08-22 ENCOUNTER — Other Ambulatory Visit: Payer: Self-pay | Admitting: Internal Medicine

## 2013-09-20 NOTE — Progress Notes (Signed)
Cardiology Office Note   Date:  09/21/2013   ID:  ALICE VITELLI, DOB March 28, 1928, MRN [id number]  PCP:  Scarlette Calico, MD  Cardiologist:  Dr. [name]      History of Present Illness: Manuel [name] is a 80 y.o. male with a hx of CAD s/p MI in 1/11, HTN, DM, allergic asthma, and hyperlipidemia. Patient developed bilateral shoulder pain in 1/11 and went to the ER in [place] (where he lived at the time), where he was found to have NSTEMI. LHC showed 99% mid CFX stenosis treated with DES. EF was preserved. Last seen in 02/2013.  He had complained of chest pain.  Myoview was performed and was low risk.  He returns for follow up.  He is doing well.  He walks daily without difficulty.  The patient denies chest pain, shortness of breath, syncope, orthopnea, PND or significant pedal edema.   Recent Labs: 05/30/2013: ALT 56*; Creatinine 0.9; HDL Cholesterol by NMR 51.00; Hemoglobin 14.0; LDL (calc) 94; Potassium 4.3; TSH 1.89   Wt Readings from Last 3 Encounters:  09/21/13 199 lb (90.266 kg)  07/26/13 199 lb 4 oz (90.379 kg)  05/30/13 199 lb (90.266 kg)     Past Medical History  Diagnosis Date  . Pneumonia     age 31  . MI (myocardial infarction) 02/2009  . GERD (gastroesophageal reflux disease)   . Allergic rhinitis     childhood  . 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 Diabetes mellitus   . Hyperlipidemia   . Chronic kidney disease     stones  . CAD (coronary artery disease)     s/p MI in 1/11. LHC (1/11) with 99% mCFX treated with 2.5 x 16 Xience V DES; 50% mLAD; EF 55%. ETT-myoview (10/12): 4'51", no significant ST segment changes, EF 56%, no ischemia or infarction.  . Asthma     Allergic component  . Hypertension   . Dizziness     Holter (10/12): Frequent PACs and PVCs. No significant bradycardia.   [name] Hx of cardiovascular stress test     ETT-Myoview (03/2013):  Inf defect on short axis images only (not felt to be significant), EF 68%; low risk.    Current Outpatient Prescriptions    Medication Sig Dispense Refill  . aspirin EC 81 MG EC tablet Take 1 tablet (81 mg total) by mouth daily.      [name] atorvastatin (LIPITOR) 40 MG tablet TAKE 1 TABLET BY MOUTH EVERY DAY  90 tablet  3  . bacitracin-polymyxin b (POLYSPORIN) ophthalmic ointment Dry eyes      . Ciclopirox 0.77 % gel Apply 1 Act topically 2 (two) times daily.  100 g  2  . fish oil-omega-3 fatty acids 1000 MG capsule Take 1 g by mouth daily.       . irbesartan-hydrochlorothiazide (AVALIDE) 300-12.5 MG per tablet Take 1 tablet by mouth daily.  90 tablet  1  . metFORMIN (GLUCOPHAGE) 500 MG tablet TAKE 2 TABLETS BY MOUTH TWICE A DAY  360 tablet  1  . metoprolol tartrate (LOPRESSOR) 25 MG tablet TAKE 1/2 TABLET TWICE A DAY  90 tablet  3  . Multiple Vitamin (MULTIVITAMIN) tablet Take 1 tablet by mouth daily.      [name] NEXIUM 40 MG capsule TAKE ONE CAPSULE BY MOUTH EVERY DAY  90 capsule  3  . nitroGLYCERIN (NITROSTAT) 0.4 MG SL tablet Place 1 tablet (0.4 mg total) under the tongue every 5 (five) minutes  as needed for chest pain.  90 tablet  3  . SYMBICORT 160-4.5 MCG/ACT inhaler INHALE 2 PUFFS INTO THE LUNGS 2 (TWO) TIMES DAILY.  30.6 g  3   No current facility-administered medications for this visit.    Allergies:   Review of patient's allergies indicates no known allergies.   Social History:  The patient  reports that he quit smoking about 47 years ago. His smoking use included Cigarettes. He smoked 0.00 packs per day. He has never used smokeless tobacco. He reports that he does not drink alcohol or use illicit drugs.   Family History:  The patient's family history includes Asthma in his father; Diabetes in his mother; Heart attack in his father; Heart failure in his mother. There is no history of Cancer.   ROS:  Please see the history of present illness.      All other systems reviewed and negative.   PHYSICAL EXAM: VS:  BP 142/70  Pulse 59  Ht 5\' 10"  (1.778 m)  Wt 199 lb (90.266 kg)  BMI 28.55 kg/m2 Well nourished,  well developed, in no acute distress HEENT: normal Neck: no JVD Vascular: No carotid bruits Cardiac:  normal S1, S2; RRR; no murmur Lungs:  Decreased breath sounds with faint inspiratory wheezes bilaterally Abd: soft, nontender, no hepatomegaly Ext: very trace bilateral edema Skin: warm and dry Neuro:  CNs 2-12 intact, no focal abnormalities noted  EKG:  Sinus brady, HR 59, low voltage, NSSTTW changes, no change from prior tracing     ASSESSMENT AND PLAN:  1. CAD, s/p Prior MI:  No angina.  Recent myoview low risk.  Continue aspirin, statin, beta blocker 2. Hypertension:  Controlled. 3. Hyperlipidemia:  Continue statin. Managed by primary care. 4. Disposition: f/u with Dr. [name] or me in 6 mos.   Signed, Versie Starks, MHS 09/21/2013 10:59 AM    [facility], [place], [place]  [address] Phone: [phone]; Fax: [phone]

## 2013-09-21 ENCOUNTER — Ambulatory Visit (INDEPENDENT_AMBULATORY_CARE_PROVIDER_SITE_OTHER): Payer: Medicare Other | Admitting: Physician Assistant

## 2013-09-21 ENCOUNTER — Encounter: Payer: Self-pay | Admitting: Physician Assistant

## 2013-09-21 VITALS — BP 142/70 | HR 59 | Ht 70.0 in | Wt 199.0 lb

## 2013-09-21 DIAGNOSIS — E785 Hyperlipidemia, unspecified: Secondary | ICD-10-CM

## 2013-09-21 DIAGNOSIS — I10 Essential (primary) hypertension: Secondary | ICD-10-CM

## 2013-09-21 DIAGNOSIS — I2510 Atherosclerotic heart disease of native coronary artery without angina pectoris: Secondary | ICD-10-CM | POA: Diagnosis not present

## 2013-09-21 NOTE — Patient Instructions (Signed)
Schedule follow up with Dr. [name] or Richardson Dopp, PA-C in 6 months.

## 2013-10-25 ENCOUNTER — Ambulatory Visit (INDEPENDENT_AMBULATORY_CARE_PROVIDER_SITE_OTHER): Payer: Medicare Other | Admitting: Ophthalmology

## 2013-11-01 ENCOUNTER — Ambulatory Visit (INDEPENDENT_AMBULATORY_CARE_PROVIDER_SITE_OTHER): Payer: Medicare Other | Admitting: Ophthalmology

## 2013-11-08 ENCOUNTER — Other Ambulatory Visit: Payer: Self-pay | Admitting: Internal Medicine

## 2013-11-08 ENCOUNTER — Ambulatory Visit (INDEPENDENT_AMBULATORY_CARE_PROVIDER_SITE_OTHER): Payer: Medicare Other | Admitting: Ophthalmology

## 2013-11-08 DIAGNOSIS — E1165 Type 2 diabetes mellitus with hyperglycemia: Secondary | ICD-10-CM | POA: Diagnosis not present

## 2013-11-08 DIAGNOSIS — H43819 Vitreous degeneration, unspecified eye: Secondary | ICD-10-CM

## 2013-11-08 DIAGNOSIS — H33309 Unspecified retinal break, unspecified eye: Secondary | ICD-10-CM

## 2013-11-08 DIAGNOSIS — E11319 Type 2 diabetes mellitus with unspecified diabetic retinopathy without macular edema: Secondary | ICD-10-CM

## 2013-11-08 DIAGNOSIS — E1139 Type 2 diabetes mellitus with other diabetic ophthalmic complication: Secondary | ICD-10-CM

## 2013-12-17 ENCOUNTER — Other Ambulatory Visit: Payer: Self-pay | Admitting: Internal Medicine

## 2013-12-27 DIAGNOSIS — Z23 Encounter for immunization: Secondary | ICD-10-CM | POA: Diagnosis not present

## 2014-01-05 LAB — HM DIABETES EYE EXAM

## 2014-03-19 ENCOUNTER — Other Ambulatory Visit: Payer: Self-pay | Admitting: Internal Medicine

## 2014-03-21 ENCOUNTER — Other Ambulatory Visit: Payer: Self-pay | Admitting: Internal Medicine

## 2014-03-22 ENCOUNTER — Ambulatory Visit (INDEPENDENT_AMBULATORY_CARE_PROVIDER_SITE_OTHER): Payer: Medicare HMO | Admitting: Internal Medicine

## 2014-03-22 ENCOUNTER — Other Ambulatory Visit (INDEPENDENT_AMBULATORY_CARE_PROVIDER_SITE_OTHER): Payer: Medicare HMO

## 2014-03-22 ENCOUNTER — Encounter: Payer: Self-pay | Admitting: Internal Medicine

## 2014-03-22 VITALS — BP 120/60 | HR 64 | Temp 98.5°F | Resp 16 | Ht 70.0 in | Wt 193.0 lb

## 2014-03-22 DIAGNOSIS — E1149 Type 2 diabetes mellitus with other diabetic neurological complication: Secondary | ICD-10-CM

## 2014-03-22 DIAGNOSIS — E785 Hyperlipidemia, unspecified: Secondary | ICD-10-CM

## 2014-03-22 DIAGNOSIS — I10 Essential (primary) hypertension: Secondary | ICD-10-CM

## 2014-03-22 DIAGNOSIS — E1140 Type 2 diabetes mellitus with diabetic neuropathy, unspecified: Secondary | ICD-10-CM

## 2014-03-22 LAB — CBC WITH DIFFERENTIAL/PLATELET
BASOS ABS: 0 10*3/uL (ref 0.0–0.1)
Basophils Relative: 0.5 % (ref 0.0–3.0)
EOS PCT: 5.2 % — AB (ref 0.0–5.0)
Eosinophils Absolute: 0.3 10*3/uL (ref 0.0–0.7)
HCT: 41.9 % (ref 39.0–52.0)
HEMOGLOBIN: 14.2 g/dL (ref 13.0–17.0)
LYMPHS ABS: 1.8 10*3/uL (ref 0.7–4.0)
Lymphocytes Relative: 34.3 % (ref 12.0–46.0)
MCHC: 33.9 g/dL (ref 30.0–36.0)
MCV: 93.3 fl (ref 78.0–100.0)
MONOS PCT: 10.2 % (ref 3.0–12.0)
Monocytes Absolute: 0.5 10*3/uL (ref 0.1–1.0)
NEUTROS ABS: 2.6 10*3/uL (ref 1.4–7.7)
Neutrophils Relative %: 49.8 % (ref 43.0–77.0)
PLATELETS: 181 10*3/uL (ref 150.0–400.0)
RBC: 4.49 Mil/uL (ref 4.22–5.81)
RDW: 14.1 % (ref 11.5–15.5)
WBC: 5.3 10*3/uL (ref 4.0–10.5)

## 2014-03-22 LAB — URINALYSIS, ROUTINE W REFLEX MICROSCOPIC
Bilirubin Urine: NEGATIVE
HGB URINE DIPSTICK: NEGATIVE
Ketones, ur: NEGATIVE
Leukocytes, UA: NEGATIVE
Nitrite: NEGATIVE
SPECIFIC GRAVITY, URINE: 1.015 (ref 1.000–1.030)
Total Protein, Urine: NEGATIVE
URINE GLUCOSE: NEGATIVE
Urobilinogen, UA: 4 — AB (ref 0.0–1.0)
pH: 7 (ref 5.0–8.0)

## 2014-03-22 LAB — TSH: TSH: 2.78 u[IU]/mL (ref 0.35–4.50)

## 2014-03-22 LAB — HEMOGLOBIN A1C: Hgb A1c MFr Bld: 6.8 % — ABNORMAL HIGH (ref 4.6–6.5)

## 2014-03-22 NOTE — Progress Notes (Signed)
Pre visit review using our clinic review tool, if applicable. No additional management support is needed unless otherwise documented below in the visit note. 

## 2014-03-22 NOTE — Patient Instructions (Signed)

## 2014-03-22 NOTE — Progress Notes (Signed)
Subjective:    Patient ID: Manuel [name], male    DOB: December 31, 1928, 81 y.o.   MRN: [id number]  Hypertension This is a chronic problem. Pertinent negatives include no blurred vision, chest pain, neck pain, palpitations or shortness of breath.  Diabetes He presents for his follow-up diabetic visit. He has type 2 diabetes mellitus. His disease course has been stable. There are no hypoglycemic associated symptoms. Pertinent negatives for hypoglycemia include no dizziness. Pertinent negatives for diabetes include no blurred vision, no chest pain, no fatigue, no foot paresthesias, no foot ulcerations, no polydipsia, no polyphagia, no polyuria, no visual change, no weakness and no weight loss. There are no hypoglycemic complications. Symptoms are stable. Diabetic complications include heart disease. Current diabetic treatment includes oral agent (monotherapy). He is compliant with treatment all of the time. He is following a generally healthy diet. Meal planning includes avoidance of concentrated sweets. He has not had a previous visit with a dietitian. He participates in exercise intermittently. There is no change in his home blood glucose trend. An ACE inhibitor/angiotensin II receptor blocker is being taken. He does not see a podiatrist.Eye exam is current.      Review of Systems  Constitutional: Negative.  Negative for fever, chills, weight loss, diaphoresis, appetite change and fatigue.  HENT: Negative.   Eyes: Negative.  Negative for blurred vision.  Respiratory: Negative.  Negative for cough, choking, chest tightness, shortness of breath and stridor.   Cardiovascular: Negative.  Negative for chest pain, palpitations and leg swelling.  Gastrointestinal: Negative.  Negative for nausea, vomiting, abdominal pain, diarrhea, constipation and blood in stool.  Endocrine: Negative.  Negative for polydipsia, polyphagia and polyuria.  Genitourinary: Negative.  Negative for difficulty urinating.    Musculoskeletal: Negative.  Negative for myalgias, back pain, arthralgias and neck pain.  Skin: Negative.  Negative for rash.  Allergic/Immunologic: Negative.   Neurological: Negative.  Negative for dizziness and weakness.  Hematological: Negative.  Negative for adenopathy. Does not bruise/bleed easily.  Psychiatric/Behavioral: Negative.        Objective:   Physical Exam  Constitutional: He is oriented to person, place, and time. He appears well-developed and well-nourished. No distress.  HENT:  Head: Normocephalic and atraumatic.  Mouth/Throat: Oropharynx is clear and moist. No oropharyngeal exudate.  Eyes: Conjunctivae are normal. Right eye exhibits no discharge. Left eye exhibits no discharge. No scleral icterus.  Neck: Normal range of motion. Neck supple. No JVD present. No tracheal deviation present. No thyromegaly present.  Cardiovascular: Normal rate, regular rhythm, normal heart sounds and intact distal pulses.  Exam reveals no gallop and no friction rub.   No murmur heard. Pulmonary/Chest: Effort normal and breath sounds normal. No stridor. No respiratory distress. He has no wheezes. He has no rales. He exhibits no tenderness.  Abdominal: Soft. Bowel sounds are normal. He exhibits no distension and no mass. There is no tenderness. There is no rebound and no guarding.  Musculoskeletal: Normal range of motion. He exhibits no edema or tenderness.  Lymphadenopathy:    He has no cervical adenopathy.  Neurological: He is oriented to person, place, and time.  Skin: Skin is warm and dry. No rash noted. He is not diaphoretic. No erythema. No pallor.  Vitals reviewed.    Lab Results  Component Value Date   WBC 5.1 05/30/2013   HGB 14.0 05/30/2013   HCT 42.0 05/30/2013   PLT 171.0 05/30/2013   GLUCOSE 121* 05/30/2013   CHOL 163 05/30/2013   TRIG 88.0 05/30/2013  HDL 51.00 05/30/2013   LDLCALC 94 05/30/2013   ALT 56* 05/30/2013   AST 59* 05/30/2013   NA 140 05/30/2013   K  4.3 05/30/2013   CL 98 05/30/2013   CREATININE 0.9 05/30/2013   BUN 20 05/30/2013   CO2 32 05/30/2013   TSH 1.89 05/30/2013   HGBA1C 6.8* 05/30/2013       Assessment & Plan:

## 2014-03-23 LAB — COMPREHENSIVE METABOLIC PANEL
ALBUMIN: 4.2 g/dL (ref 3.5–5.2)
ALK PHOS: 111 U/L (ref 39–117)
ALT: 67 U/L — ABNORMAL HIGH (ref 0–53)
AST: 73 U/L — ABNORMAL HIGH (ref 0–37)
BUN: 22 mg/dL (ref 6–23)
CALCIUM: 10 mg/dL (ref 8.4–10.5)
CHLORIDE: 99 meq/L (ref 96–112)
CO2: 27 mEq/L (ref 19–32)
CREATININE: 1.02 mg/dL (ref 0.40–1.50)
GFR: 73.97 mL/min (ref >60.00)
GLUCOSE: 106 mg/dL — AB (ref 70–99)
Potassium: 5.5 mEq/L — ABNORMAL HIGH (ref 3.5–5.1)
Sodium: 138 mEq/L (ref 135–145)
Total Bilirubin: 0.6 mg/dL (ref 0.2–1.2)
Total Protein: 7.6 g/dL (ref 6.0–8.3)

## 2014-03-23 LAB — LIPID PANEL
Cholesterol: 158 mg/dL (ref 0–200)
HDL: 46.7 mg/dL (ref >39.00)
LDL Cholesterol: 84 mg/dL (ref 0–99)
NonHDL: 111.3
Total CHOL/HDL Ratio: 3
Triglycerides: 136 mg/dL (ref 0.0–149.0)
VLDL: 27.2 mg/dL (ref 0.0–40.0)

## 2014-03-23 LAB — MICROALBUMIN / CREATININE URINE RATIO
Creatinine,U: 85.1 mg/dL
MICROALB UR: 1 mg/dL (ref 0.0–1.9)
MICROALB/CREAT RATIO: 1.2 mg/g (ref 0.0–30.0)

## 2014-03-23 NOTE — Assessment & Plan Note (Signed)
He is due for an A1C check and will monitor his renal function

## 2014-03-23 NOTE — Assessment & Plan Note (Signed)
He is doing well on the statin I will check his FLP and will advise further

## 2014-03-23 NOTE — Assessment & Plan Note (Signed)
His BP is well controlled I will monitor his lytes and renal function 

## 2014-03-24 ENCOUNTER — Encounter: Payer: Self-pay | Admitting: Internal Medicine

## 2014-03-27 ENCOUNTER — Telehealth: Payer: Self-pay | Admitting: Internal Medicine

## 2014-03-27 MED ORDER — METFORMIN HCL 500 MG PO TABS: 1000.0000 mg | ORAL_TABLET | Freq: Two times a day (BID) | ORAL | Status: DC

## 2014-03-27 NOTE — Telephone Encounter (Signed)
Pt called stated he need Metformin to be send to [facility] on [address]. Pt last ov was 03/22/14. Please advise, he is out.

## 2014-03-27 NOTE — Telephone Encounter (Signed)
Rx approved

## 2014-04-10 ENCOUNTER — Ambulatory Visit (INDEPENDENT_AMBULATORY_CARE_PROVIDER_SITE_OTHER): Payer: Medicare HMO | Admitting: Cardiology

## 2014-04-10 ENCOUNTER — Encounter: Payer: Self-pay | Admitting: Cardiology

## 2014-04-10 VITALS — BP 122/62 | HR 64 | Ht 70.0 in | Wt 191.0 lb

## 2014-04-10 DIAGNOSIS — I2510 Atherosclerotic heart disease of native coronary artery without angina pectoris: Secondary | ICD-10-CM

## 2014-04-10 DIAGNOSIS — E785 Hyperlipidemia, unspecified: Secondary | ICD-10-CM

## 2014-04-10 LAB — BASIC METABOLIC PANEL
BUN: 23 mg/dL (ref 6–23)
CO2: 35 meq/L — AB (ref 19–32)
Calcium: 10.6 mg/dL — ABNORMAL HIGH (ref 8.4–10.5)
Chloride: 96 mEq/L (ref 96–112)
Creatinine, Ser: 0.93 mg/dL (ref 0.40–1.50)
GFR: 82.28 mL/min (ref >60.00)
Glucose, Bld: 91 mg/dL (ref 70–99)
Potassium: 4.5 mEq/L (ref 3.5–5.1)
SODIUM: 138 meq/L (ref 135–145)

## 2014-04-10 NOTE — Patient Instructions (Signed)
Your physician recommends that you have lab work today--BMET.  Your physician wants you to follow-up in: 1 year with Dr Aundra Dubin. (February 2017).  You will receive a reminder letter in the mail two months in advance. If you don't receive a letter, please call our office to schedule the follow-up appointment.

## 2014-04-10 NOTE — Progress Notes (Signed)
Patient ID: Manuel [name], male   DOB: 1928-04-13, 81 y.o.   MRN: [id number] PCP: Dr. Ronnald Ramp  81 yo with history of CAD s/p MI in 1/11, HTN, DM, allergic asthma, and hyperlipidemia presents for cardiology followup.  Patient developed bilateral shoulder pain in 1/11 and went to the ER, where he was found to have NSTEMI.  LHC showed 99% mid CFX stenosis treated with DES.  EF was preserved.  Last Cardiolite in 2/15 showed no evidence for ischemia or infarction.   Since I last saw him, Manuel [name] has been doing well.  No exertional chest pain.  He only gets short of breath walking up a steep hill.  He plays golf about once a week and walks for 1.5 miles 5 times a week.    ECG: NSR, nonspecific T wave flattening  Labs (7/12): LDL 71, HDL 50, K 5, creatinine 0.8 Labs (1/13): LDL 65, HDL 47 Labs (4/13): K 4.6, creatinine 0.8 Labs (1/16): LDL 84, HDl 47, K 5.5, creatinine 1.02  PMH: 1. Asthma: Allergic component 2. CAD: s/p MI in 1/11.  LHC (1/11) with 99% mCFX treated with 2.5 x 16 Xience V DES; 50% mLAD; EF 55%.  ETT-myoview (10/12): 4'51", no significant ST segment changes, EF 56%, no ischemia or infarction. ETT-Cardiolite (2/15): 4'15", probably normal with no ischemia/infarction, EF 68%.  3. GERD 4. Appendectomy 5. HTN 6. Hyperlipidemia 7. Diabetes mellitus type II 8. Lightheaded spells: Holter (10/12): Frequent PACs and PVCs.  No significant bradycardia.    FH: Parents lived into their 61s.  No premature CAD.    SH: Moved from [place], [place] to [place].  Married, originally from [place].  Now living in daughter's house.  Quit smoking 1990, retired Hotel manager, occasional smoker.     Current Outpatient Prescriptions  Medication Sig Dispense Refill  . aspirin EC 81 MG EC tablet Take 1 tablet (81 mg total) by mouth daily.    [name] atorvastatin (LIPITOR) 40 MG tablet TAKE 1 TABLET BY MOUTH EVERY DAY 90 tablet 3  . bacitracin-polymyxin b (POLYSPORIN) ophthalmic ointment Dry eyes    . Ciclopirox  0.77 % gel Apply 1 Act topically 2 (two) times daily. 100 g 2  . fish oil-omega-3 fatty acids 1000 MG capsule Take 1 g by mouth daily.     [name] FLUZONE HIGH-DOSE 0.5 ML SUSY   0  . irbesartan-hydrochlorothiazide (AVALIDE) 300-12.5 MG per tablet TAKE 1 TABLET BY MOUTH ONCE DAILY 90 tablet 1  . metFORMIN (GLUCOPHAGE) 500 MG tablet Take 2 tablets (1,000 mg total) by mouth 2 (two) times daily. 360 tablet 3  . metoprolol tartrate (LOPRESSOR) 25 MG tablet TAKE 1/2 TABLET TWICE A DAY 90 tablet 3  . Multiple Vitamin (MULTIVITAMIN) tablet Take 1 tablet by mouth daily.    [name] NEXIUM 40 MG capsule TAKE ONE CAPSULE BY MOUTH EVERY DAY 90 capsule 3  . nitroGLYCERIN (NITROSTAT) 0.4 MG SL tablet Place 1 tablet (0.4 mg total) under the tongue every 5 (five) minutes as needed for chest pain. 90 tablet 3  . SYMBICORT 160-4.5 MCG/ACT inhaler INHALE 2 PUFFS INTO THE LUNGS 2 (TWO) TIMES DAILY. 30.6 g 3   No current facility-administered medications for this visit.    BP 122/62 mmHg  Pulse 64  Ht 5' 10"  (1.778 m)  Wt 191 lb (86.637 kg)  BMI 27.41 kg/m2 General: NAD Neck: No JVD, no thyromegaly or thyroid nodule.  Lungs: Clear to auscultation bilaterally with normal respiratory effort. CV: Nondisplaced PMI.  Heart regular  S1/S2, no S3/S4, no murmur.  No peripheral edema.  No carotid bruit.  Normal pedal pulses.  Abdomen: Soft, nontender, no hepatosplenomegaly, no distention.  Neurologic: Alert and oriented x 3.  Psych: Normal affect. Extremities: No clubbing or cyanosis.   Assessment/Plan: CORONARY HEART DISEASE  Cardiolite 2/15 with no evidence for ischemia or infarction. Continue ASA 81, statin, metoprolol, and ARB.  Continue daily exercise.  Hyperlipidemia Good lipids recently, continue statin.  Hyperkalemia K high on recent labs.  He is not taking a K supplement.  Would follow low K diet and repeat BMET today.    [name] 04/10/2014

## 2014-04-11 ENCOUNTER — Telehealth: Payer: Self-pay | Admitting: Cardiology

## 2014-04-11 NOTE — Telephone Encounter (Signed)
New Message  Pt daughter calling Rn back about test results. Please call back and discuss.

## 2014-04-11 NOTE — Telephone Encounter (Signed)
Patient's daughter notified of lab results. Reviewed results and parameters. She verbalized understanding and agreement with current treatment plan. She voiced appreciation of the phone call.

## 2014-06-05 ENCOUNTER — Telehealth: Payer: Self-pay

## 2014-06-05 NOTE — Telephone Encounter (Signed)
Call to patient's home phone and rec'd voice mail from "vegas fritze" who is on file in Dem[facility] field. LVM to introduce Annual Medicare Wellness visit. Invited to call the office for more information

## 2014-06-07 ENCOUNTER — Other Ambulatory Visit: Payer: Self-pay | Admitting: Cardiology

## 2014-06-14 ENCOUNTER — Other Ambulatory Visit: Payer: Self-pay | Admitting: Internal Medicine

## 2014-06-22 ENCOUNTER — Other Ambulatory Visit: Payer: Self-pay | Admitting: Internal Medicine

## 2014-07-18 ENCOUNTER — Encounter: Payer: Self-pay | Admitting: Internal Medicine

## 2014-07-18 ENCOUNTER — Other Ambulatory Visit (INDEPENDENT_AMBULATORY_CARE_PROVIDER_SITE_OTHER): Payer: Medicare HMO

## 2014-07-18 ENCOUNTER — Ambulatory Visit (INDEPENDENT_AMBULATORY_CARE_PROVIDER_SITE_OTHER): Payer: Medicare HMO | Admitting: Internal Medicine

## 2014-07-18 VITALS — BP 116/68 | HR 78 | Temp 98.4°F | Resp 16 | Ht 70.0 in | Wt 189.0 lb

## 2014-07-18 DIAGNOSIS — E1149 Type 2 diabetes mellitus with other diabetic neurological complication: Secondary | ICD-10-CM

## 2014-07-18 DIAGNOSIS — E1140 Type 2 diabetes mellitus with diabetic neuropathy, unspecified: Secondary | ICD-10-CM

## 2014-07-18 DIAGNOSIS — Z23 Encounter for immunization: Secondary | ICD-10-CM | POA: Diagnosis not present

## 2014-07-18 DIAGNOSIS — I10 Essential (primary) hypertension: Secondary | ICD-10-CM

## 2014-07-18 DIAGNOSIS — E785 Hyperlipidemia, unspecified: Secondary | ICD-10-CM | POA: Diagnosis not present

## 2014-07-18 LAB — COMPREHENSIVE METABOLIC PANEL
ALT: 69 U/L — ABNORMAL HIGH (ref 0–53)
AST: 67 U/L — ABNORMAL HIGH (ref 0–37)
Albumin: 4.1 g/dL (ref 3.5–5.2)
Alkaline Phosphatase: 93 U/L (ref 39–117)
BUN: 21 mg/dL (ref 6–23)
CALCIUM: 9.4 mg/dL (ref 8.4–10.5)
CHLORIDE: 97 meq/L (ref 96–112)
CO2: 34 meq/L — AB (ref 19–32)
Creatinine, Ser: 0.97 mg/dL (ref 0.40–1.50)
GFR: 78.33 mL/min (ref >60.00)
GLUCOSE: 114 mg/dL — AB (ref 70–99)
POTASSIUM: 4.5 meq/L (ref 3.5–5.1)
Sodium: 138 mEq/L (ref 135–145)
Total Bilirubin: 0.8 mg/dL (ref 0.2–1.2)
Total Protein: 7.4 g/dL (ref 6.0–8.3)

## 2014-07-18 LAB — HEMOGLOBIN A1C: Hgb A1c MFr Bld: 6.3 % (ref 4.6–6.5)

## 2014-07-18 NOTE — Patient Instructions (Signed)

## 2014-07-18 NOTE — Progress Notes (Signed)
Subjective:  Patient ID: Manuel [name], male    DOB: 04/17/1928  Age: 81 y.o. MRN: [id number]  CC: Diabetes   HPI Manuel [name] presents for follow up on DM 2 - he tells me that his blood sugars have been well controlled.  Outpatient Prescriptions Prior to Visit  Medication Sig Dispense Refill  . aspirin EC 81 MG EC tablet Take 1 tablet (81 mg total) by mouth daily.    [name] atorvastatin (LIPITOR) 40 MG tablet TAKE 1 TABLET BY MOUTH EVERY DAY 90 tablet 1  . bacitracin-polymyxin b (POLYSPORIN) ophthalmic ointment Dry eyes    . Ciclopirox 0.77 % gel Apply 1 Act topically 2 (two) times daily. 100 g 2  . esomeprazole (NEXIUM) 40 MG capsule TAKE ONE CAPSULE BY MOUTH EVERY DAY 90 capsule 3  . fish oil-omega-3 fatty acids 1000 MG capsule Take 1 g by mouth daily.     [name] FLUZONE HIGH-DOSE 0.5 ML SUSY   0  . irbesartan-hydrochlorothiazide (AVALIDE) 300-12.5 MG per tablet TAKE 1 TABLET BY MOUTH EVERY DAY 90 tablet 1  . metFORMIN (GLUCOPHAGE) 500 MG tablet Take 2 tablets (1,000 mg total) by mouth 2 (two) times daily. 360 tablet 3  . metoprolol tartrate (LOPRESSOR) 25 MG tablet TAKE 1/2 TABLET BY MOUTH TWICE A DAY 90 tablet 3  . Multiple Vitamin (MULTIVITAMIN) tablet Take 1 tablet by mouth daily.    . nitroGLYCERIN (NITROSTAT) 0.4 MG SL tablet Place 1 tablet (0.4 mg total) under the tongue every 5 (five) minutes as needed for chest pain. 90 tablet 3  . SYMBICORT 160-4.5 MCG/ACT inhaler INHALE 2 PUFFS INTO THE LUNGS 2 (TWO) TIMES DAILY. 30.6 g 3   No facility-administered medications prior to visit.    ROS Review of Systems  Constitutional: Negative.  Negative for fever, chills, diaphoresis, appetite change and fatigue.  HENT: Negative.   Eyes: Negative.   Respiratory: Negative.  Negative for cough, choking, chest tightness, shortness of breath and stridor.   Cardiovascular: Negative.  Negative for chest pain, palpitations and leg swelling.  Gastrointestinal: Negative.  Negative for nausea, vomiting,  abdominal pain, diarrhea, constipation and blood in stool.  Endocrine: Negative.  Negative for polydipsia, polyphagia and polyuria.  Genitourinary: Negative.  Negative for dysuria, urgency, frequency, hematuria, decreased urine volume and difficulty urinating.  Musculoskeletal: Negative.  Negative for myalgias, back pain, arthralgias and neck pain.  Skin: Negative.   Allergic/Immunologic: Negative.   Neurological: Negative.  Negative for dizziness, tremors, syncope, light-headedness and numbness.  Hematological: Negative for adenopathy. Does not bruise/bleed easily.  Psychiatric/Behavioral: Negative.     Objective:  BP 116/68 mmHg  Pulse 78  Temp(Src) 98.4 F (36.9 C) (Oral)  Resp 16  Ht 5' 10"  (1.778 m)  Wt 189 lb (85.73 kg)  BMI 27.12 kg/m2  SpO2 98%  BP Readings from Last 3 Encounters:  07/18/14 116/68  04/10/14 122/62  03/22/14 120/60    Wt Readings from Last 3 Encounters:  07/18/14 189 lb (85.73 kg)  04/10/14 191 lb (86.637 kg)  03/22/14 193 lb (87.544 kg)    Physical Exam  Constitutional: He is oriented to person, place, and time. He appears well-developed and well-nourished. No distress.  HENT:  Head: Normocephalic and atraumatic.  Mouth/Throat: Oropharynx is clear and moist. No oropharyngeal exudate.  Eyes: Conjunctivae are normal. Right eye exhibits no discharge. Left eye exhibits no discharge. No scleral icterus.  Neck: Normal range of motion. Neck supple. No JVD present. No tracheal deviation present. No thyromegaly present.  Cardiovascular: Normal rate, regular rhythm, normal heart sounds and intact distal pulses.  Exam reveals no gallop and no friction rub.   No murmur heard. Pulmonary/Chest: Effort normal and breath sounds normal. No stridor. No respiratory distress. He has no wheezes. He has no rales. He exhibits no tenderness.  Abdominal: Soft. Bowel sounds are normal. He exhibits no distension and no mass. There is no tenderness. There is no rebound and no  guarding.  Musculoskeletal: Normal range of motion. He exhibits no edema or tenderness.  Lymphadenopathy:    He has no cervical adenopathy.  Neurological: He is oriented to person, place, and time.  Skin: Skin is warm and dry. No rash noted. He is not diaphoretic. No erythema. No pallor.  Psychiatric: He has a normal mood and affect. His behavior is normal. Judgment and thought content normal.  Vitals reviewed.   Lab Results  Component Value Date   WBC 5.3 03/22/2014   HGB 14.2 03/22/2014   HCT 41.9 03/22/2014   PLT 181.0 03/22/2014   GLUCOSE 91 04/10/2014   CHOL 158 03/22/2014   TRIG 136.0 03/22/2014   HDL 46.70 03/22/2014   LDLCALC 84 03/22/2014   ALT 67* 03/22/2014   AST 73* 03/22/2014   NA 138 04/10/2014   K 4.5 04/10/2014   CL 96 04/10/2014   CREATININE 0.93 04/10/2014   BUN 23 04/10/2014   CO2 35* 04/10/2014   TSH 2.78 03/22/2014   HGBA1C 6.8* 03/22/2014   MICROALBUR 1.0 03/22/2014    Dg Foot Complete Right  05/17/2012   *RADIOLOGY REPORT*  Clinical Data: Right-sided foot pain.  RIGHT FOOT COMPLETE - 3+ VIEW  Comparison: No priors.  Findings: Three views of the right foot demonstrate no acute displaced fracture, subluxation or dislocation.  Soft tissues are unremarkable.  Dorsal calcaneal spur incidentally noted. Joint space narrowing, subchondral sclerosis and osteophyte formation are noted throughout the midfoot, and to a lesser extent at the first MTP joint, compatible with osteoarthritis.  IMPRESSION: 1.  No acute radiographic abnormality of the right foot. 2.  Degenerative changes of osteoarthritis, as above.   Original Report Authenticated By: Vinnie Langton, M.D.    Assessment & Plan:   Manuel [name] was seen today for diabetes.  Diagnoses and all orders for this visit:  Diabetes mellitus type 2 with neurological manifestations - will recheck his A1C and will address if it is too high or too low. Orders: -     Comprehensive metabolic panel; Future -     Hemoglobin  A1c; Future  Essential hypertension, benign - his BP is well controlled, will monitor his lytes and renal function Orders: -     Comprehensive metabolic panel; Future  Hyperlipidemia with target LDL less than 100 Orders: -     Comprehensive metabolic panel; Future  Need for prophylactic vaccination against Streptococcus pneumoniae (pneumococcus) Orders: -     Pneumococcal conjugate vaccine 13-valent IM  I am having Mr. Frasier maintain his fish oil-omega-3 fatty acids, aspirin EC, multivitamin, nitroGLYCERIN, Ciclopirox, SYMBICORT, bacitracin-polymyxin b, FLUZONE HIGH-DOSE, metFORMIN, metoprolol tartrate, irbesartan-hydrochlorothiazide, esomeprazole, and atorvastatin.  No orders of the defined types were placed in this encounter.     Follow-up: Return in about 4 months (around 11/18/2014).  Scarlette Calico, MD

## 2014-07-31 ENCOUNTER — Telehealth: Payer: Self-pay | Admitting: Internal Medicine

## 2014-07-31 NOTE — Telephone Encounter (Signed)
Elizabeth calledto advise that [facility] 160-4.5 MCG/ACT inhaler [[id number]] should be changed to advair HFA on the next refill

## 2014-08-16 ENCOUNTER — Other Ambulatory Visit: Payer: Self-pay

## 2014-08-16 MED ORDER — BUDESONIDE-FORMOTEROL FUMARATE 160-4.5 MCG/ACT IN AERO: INHALATION_SPRAY | RESPIRATORY_TRACT | Status: DC

## 2014-08-18 ENCOUNTER — Telehealth: Payer: Self-pay

## 2014-08-18 MED ORDER — FLUTICASONE-SALMETEROL 250-50 MCG/DOSE IN AEPB: 1.0000 | INHALATION_SPRAY | Freq: Two times a day (BID) | RESPIRATORY_TRACT | Status: DC

## 2014-08-18 NOTE — Telephone Encounter (Signed)
Ok Done Thx

## 2014-08-18 NOTE — Telephone Encounter (Signed)
Patient called to educate on Medicare Wellness apt. LVM for the patient to call back to educate and schedule for wellness visit.  Left vm with Sima Matas.

## 2014-08-18 NOTE — Telephone Encounter (Signed)
The dtr Kathlee Nations Segoviano) called in and Left VM late that this patient needs symbicort changed to Advair hsa because of the expense involved. I am forwarding as per the chart entry, the below message was called in on 6/6;   Benjamine Mola calledto advise that [facility] 160-4.5 MCG/ACT inhaler [[id number]] should be changed to advair HFA on the next refill  Thanks Sh

## 2014-08-22 NOTE — Telephone Encounter (Signed)
Call rec'd from Sima Matas; Stated they were able to get Manuel [name]'s medication. Also to schedule apt for Tuesday or Wed and to call her with a few times.  Called dtr back at home number; LVM for any Tues or Wed except July 12th and to leave me a couple to times that his convenient for her and will schedule him in.

## 2014-09-06 ENCOUNTER — Ambulatory Visit (INDEPENDENT_AMBULATORY_CARE_PROVIDER_SITE_OTHER): Payer: Medicare HMO

## 2014-09-06 VITALS — BP 130/80 | Ht 70.0 in | Wt 195.5 lb

## 2014-09-06 DIAGNOSIS — Z0000 Encounter for general adult medical examination without abnormal findings: Secondary | ICD-10-CM

## 2014-09-06 NOTE — Progress Notes (Signed)
Subjective:   Manuel [name] is a 81 y.o. male who presents for Medicare Annual (Subsequent) preventive examination.  Review of Systems:   Cardiac Risk Factors include: advanced age (>71men, >65 women);dyslipidemia;family history of premature cardiovascular disease;male gender HRA assessment completed during visit; Manuel [name] Patient is here for Annual Wellness Assessment The Patient was informed that this wellness visit is to identify risk and educate on how to reduce risk for increase disease through lifestyle changes.  The patient verbalized understanding that any voiced medical issues still need to be directed to their physician at a follow up visit as this is not a "physical exam" in which medical issues are addressed and treated.  Personalized education given regarding risk on the problem list that are currently being medically managed; history of CAD s/p MI in 1/11, HTN, DM, allergic asthma, and hyperlipidemia Quit smoking 1990 CVD with stent 2011;   Father had heart issues but didn't incapacitate him; lived to be 50  Diet: Breakfast every day; normal breakfast omelet; veg or otherwise Eats salads and vegetables;   Exercise: He plays golf about once a week and walks for 1.5 miles 5 times a week. Averages approx 60 minutes of exercise daily considering all of his activities; Very flexible  Attends silver sneaker type class x 2 per week w spouse Reports issue with knees; left had torn meniscus Right knee will get a catch;   SAFETY Generalized Safety in the home reviewed/  Lives in addition built as especially for them.  Fall prevention; no falls Urinary or fecal incontinence reviewed; up to the bathroom at hs but has good lighting;  Discussed orthostatic hypotension and need to go slow when changing positions from standing to sitting  Educated on prevention falls; Exercise, toning and strengthening; Balance exercises  Wear Comfortable shoes; all of which the patient is actively  engaged.  Home safety; removal of throw rugs; bathroom handrails; Un-level surfaces outside; Has handicapped accessible apt.  Review Firearm safety as appropriate;  Oceanographer; Assessed for community safety;  Emergency Plan for illness or other/ lives with Dtr Driving: seatbelt  Sun protection when playing golf  Screenings overdue:  Ophthalmology exam; exam; slowly going blind but eye issue was resolved; had 4 tears around OD; treated by Dr. Zigmund Daniel, Jenny Reichmann. Eyes are checked no every year; seen specialist x 2   Immunizations; None noted to be overdue at this time Shingles- educated to check with insurer;  Colonoscopy; 02/14/2007 EKG 04/10/2014;  Hearing: 2000hz  both ears; Dental: sees the dentist x 2 a year  Current Care Team reviewed and updated Dr Aundra Dubin, Shiela Mayer, Scott PA      Objective:     Objective:    Vitals: BP 130/80 mmHg  Ht 5\' 10"  (1.778 m)  Wt 195 lb 8 oz (88.678 kg)  BMI 28.05 kg/m2  Tobacco History  Smoking status  . Former Smoker  . Types: Cigarettes  . Quit date: 06/09/1966  Smokeless tobacco  . Never Used     Counseling given: Yes   Past Medical History  Diagnosis Date  . Pneumonia     age 63  . MI (myocardial infarction) 02/2009  . GERD (gastroesophageal reflux disease)   . Allergic rhinitis     childhood  . 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 Diabetes mellitus   . Hyperlipidemia   . Chronic kidney disease     stones  . CAD (coronary artery disease)     s/p MI  in 1/11. LHC (1/11) with 99% mCFX treated with 2.5 x 16 Xience V DES; 50% mLAD; EF 55%. ETT-myoview (10/12): 4'51", no significant ST segment changes, EF 56%, no ischemia or infarction.  . Asthma     Allergic component  . Hypertension   . Dizziness     Holter (10/12): Frequent PACs and PVCs. No significant bradycardia.   [name] Hx of cardiovascular stress test     ETT-Myoview (03/2013):  Inf defect on short axis images only (not felt to be significant), EF 68%; low risk.    Past Surgical History  Procedure Laterality Date  . Appendectomy    . Coronary stent placement     Family History  Problem Relation Age of Onset  . Cancer Neg Hx   . Heart attack Father   . Diabetes Mother   . Asthma Father   . Heart failure Mother    History  Sexual Activity  . Sexual Activity: Not Currently    Outpatient Encounter Prescriptions as of 09/06/2014  Medication Sig  . aspirin EC 81 MG EC tablet Take 1 tablet (81 mg total) by mouth daily.  [name] atorvastatin (LIPITOR) 40 MG tablet TAKE 1 TABLET BY MOUTH EVERY DAY  . esomeprazole (NEXIUM) 40 MG capsule TAKE ONE CAPSULE BY MOUTH EVERY DAY  . fish oil-omega-3 fatty acids 1000 MG capsule Take 1 g by mouth daily.   . Fluticasone-Salmeterol (ADVAIR DISKUS) 250-50 MCG/DOSE AEPB Inhale 1 puff into the lungs 2 (two) times daily.  . irbesartan-hydrochlorothiazide (AVALIDE) 300-12.5 MG per tablet TAKE 1 TABLET BY MOUTH EVERY DAY  . metFORMIN (GLUCOPHAGE) 500 MG tablet Take 2 tablets (1,000 mg total) by mouth 2 (two) times daily.  . metoprolol tartrate (LOPRESSOR) 25 MG tablet TAKE 1/2 TABLET BY MOUTH TWICE A DAY  . Multiple Vitamin (MULTIVITAMIN) tablet Take 1 tablet by mouth daily.  . nitroGLYCERIN (NITROSTAT) 0.4 MG SL tablet Place 1 tablet (0.4 mg total) under the tongue every 5 (five) minutes as needed for chest pain.   No facility-administered encounter medications on file as of 09/06/2014.    Activities of Daily Living In your present state of health, do you have any difficulty performing the following activities: 09/06/2014  Hearing? N  Vision? N  Difficulty concentrating or making decisions? N  Walking or climbing stairs? N  Dressing or bathing? N  Doing errands, shopping? N  Preparing Food and eating ? N  Using the Toilet? N  In the past six months, have you accidently leaked urine? N  Do you have problems with loss of bowel control? N  Managing your Medications? N  Managing your Finances? N  Housekeeping or  managing your Housekeeping? N    Patient Care Team: [name], MD as PCP - General (Internal Medicine)    Assessment:    Pt determined a personalized goal of continuing his daily issues To maintain his health  Assessment included:  Taking meds without issues; no barriers identified  Stress: Recommendations for managing stress if assessed as a factor;   No Risk for hepatitis or high risk social behavior identified via hepatitis screen  Educated on shingles and follow up with insurance company for co-pays or charges applied to Part D benefit. Had slight case of shingles x 4 years ago. Was not significant but will fup. Educated on Vaccines; up to date Safety issues reviewed;  Cognition assessed by AD8; Score 0 MMSE deferred as the patient stated they had no memory issues; No identified risk were noted;  The patient was oriented x 3; appropriate in dress and manner and no objective failures at ADL's or IADL's.   Depression screen negative;   Functional status reviewed; no losses in function x 1 year  Bowel and bladder issues assessed  End of life planning was discussed; aging in home or other; plans to complete HCPOA were discussed if not completed/ Dtr is HCPOA; encouraged to bring a copy to the office to scan to medical record.  Meds: Manages medications Has rx for Metronidazle gel 0.75% - states he uses on skin on occasion Ciclopirox 0.77% gel on feet occasionally OTC ASA 81mg  1 fish oil 1200u 1 super B complex One multi-vitamin   Exercise Activities and Dietary recommendations Current Exercise Habits:: Home exercise routine;Structured exercise class, Type of exercise: strength training/weights;walking, Time (Minutes): 60, Frequency (Times/Week): 5, Weekly Exercise (Minutes/Week): 300, Intensity: Moderate   Goal is to maintain his health and continue exercise and diet;  Risk related to caregiving role of spouse with early organic described dementia. Gave resources  to review for helpful information when caring for someone with memory issues.  Educated on risk for depression for spouse or the patient but the patient did not note any issues at present.  Goals    None     Fall Risk Fall Risk  09/06/2014 09/06/2014 07/18/2014 09/21/2012 09/21/2012  Falls in the past year? No No No No No   Depression Screen PHQ 2/9 Scores 09/06/2014 09/06/2014 07/18/2014 09/21/2012  PHQ - 2 Score 0 0 0 0     Cognitive Testing MMSE - Mini Mental State Exam 09/06/2014  Not completed: Unable to complete    Immunization History  Administered Date(s) Administered  . Influenza Whole 12/31/2010, 12/24/2011  . Influenza-Unspecified 11/24/2012, 12/27/2013  . Pneumococcal Conjugate-13 11/15/2009, 09/21/2012, 07/18/2014  . Tdap 05/27/2011   Screening Tests Health Maintenance  Topic Date Due  . INFLUENZA VACCINE  09/25/2014  . OPHTHALMOLOGY EXAM  01/06/2015  . HEMOGLOBIN A1C  01/18/2015  . URINE MICROALBUMIN  03/23/2015  . FOOT EXAM  07/18/2015  . PNA vac Low Risk Adult (2 of 2 - PPSV23) 07/18/2015  . COLONOSCOPY  12/14/2016  . TETANUS/TDAP  05/26/2021  . ZOSTAVAX  Addressed      Plan:   Had shingles x 4.5 years; to check insurance regarding where he can receive it. No pneumonia vaccinations due; Appears to have had prevnar/ pneumovax is inconclusive.   During the course of the visit the patient was educated and counseled about the following appropriate screening and preventive services:   Vaccines to include Pneumoccal, Influenza, Hepatitis B, Td, Zostavax, HCV  Electrocardiogram  Cardiovascular Disease/ had minimal episodes of chest pain, non current  Colorectal cancer screening  Bone density screening  Diabetes screening; educated on pre-diabetes and currently on metformin  Glaucoma screening: has vision checked routinely     Nutrition counseling   Patient Instructions (the written plan) was given to the patient.   Wynetta Fines,  RN  09/06/2014

## 2014-09-06 NOTE — Patient Instructions (Addendum)
Mr. Manuel [name] , Thank you for taking time to come for your Medicare Wellness Visit. I appreciate your ongoing commitment to your health goals. Please review the following plan we discussed and let me know if I can assist you in the future.    These are the goals we discussed: Goals    None      This is a list of the screening recommended for you and due dates:  Health Maintenance  Topic Date Due  . Flu Shot  09/25/2014  . Eye exam for diabetics  01/06/2015  . Hemoglobin A1C  01/18/2015  . Urine Protein Check  03/23/2015  . Complete foot exam   07/18/2015  . Pneumonia vaccines (2 of 2 - PPSV23) 07/18/2015  . Colon Cancer Screening  12/14/2016  . Tetanus Vaccine  05/26/2021  . Shingles Vaccine  Addressed     Health Maintenance A healthy lifestyle and preventative care can promote health and wellness.  Maintain regular health, dental, and eye exams.  Eat a healthy diet. Foods like vegetables, fruits, whole grains, low-fat dairy products, and lean protein foods contain the nutrients you need and are low in calories. Decrease your intake of foods high in solid fats, added sugars, and salt. Get information about a proper diet from your health care provider, if necessary.  Regular physical exercise is one of the most important things you can do for your health. Most adults should get at least 150 minutes of moderate-intensity exercise (any activity that increases your heart rate and causes you to sweat) each week. In addition, most adults need muscle-strengthening exercises on 2 or more days a week.   Maintain a healthy weight. The body mass index (BMI) is a screening tool to identify possible weight problems. It provides an estimate of body fat based on height and weight. Your health care provider can find your BMI and can help you achieve or maintain a healthy weight. For males 20 years and older:  A BMI below 18.5 is considered underweight.  A BMI of 18.5 to 24.9 is normal.  A BMI of  25 to 29.9 is considered overweight.  A BMI of 30 and above is considered obese.  Maintain normal blood lipids and cholesterol by exercising and minimizing your intake of saturated fat. Eat a balanced diet with plenty of fruits and vegetables. Blood tests for lipids and cholesterol should begin at age 19 and be repeated every 5 years. If your lipid or cholesterol levels are high, you are over age 6, or you are at high risk for heart disease, you may need your cholesterol levels checked more frequently.Ongoing high lipid and cholesterol levels should be treated with medicines if diet and exercise are not working.  If you smoke, find out from your health care provider how to quit. If you do not use tobacco, do not start.  Lung cancer screening is recommended for adults aged 33-80 years who are at high risk for developing lung cancer because of a history of smoking. A yearly low-dose CT scan of the lungs is recommended for people who have at least a 30-pack-year history of smoking and are current smokers or have quit within the past 15 years. A pack year of smoking is smoking an average of 1 pack of cigarettes a day for 1 year (for example, a 30-pack-year history of smoking could mean smoking 1 pack a day for 30 years or 2 packs a day for 15 years). Yearly screening should continue until the smoker has  stopped smoking for at least 15 years. Yearly screening should be stopped for people who develop a health problem that would prevent them from having lung cancer treatment.  If you choose to drink alcohol, do not have more than 2 drinks per day. One drink is considered to be 12 oz (360 mL) of beer, 5 oz (150 mL) of wine, or 1.5 oz (45 mL) of liquor.  Avoid the use of street drugs. Do not share needles with anyone. Ask for help if you need support or instructions about stopping the use of drugs.  High blood pressure causes heart disease and increases the risk of stroke. Blood pressure should be checked at  least every 1-2 years. Ongoing high blood pressure should be treated with medicines if weight loss and exercise are not effective.  If you are 7-86 years old, ask your health care provider if you should take aspirin to prevent heart disease.  Diabetes screening involves taking a blood sample to check your fasting blood sugar level. This should be done once every 3 years after age 66 if you are at a normal weight and without risk factors for diabetes. Testing should be considered at a younger age or be carried out more frequently if you are overweight and have at least 1 risk factor for diabetes.  Colorectal cancer can be detected and often prevented. Most routine colorectal cancer screening begins at the age of 9 and continues through age 24. However, your health care provider may recommend screening at an earlier age if you have risk factors for colon cancer. On a yearly basis, your health care provider may provide home test kits to check for hidden blood in the stool. A small camera at the end of a tube may be used to directly examine the colon (sigmoidoscopy or colonoscopy) to detect the earliest forms of colorectal cancer. Talk to your health care provider about this at age 33 when routine screening begins. A direct exam of the colon should be repeated every 5-10 years through age 75, unless early forms of precancerous polyps or small growths are found.  People who are at an increased risk for hepatitis B should be screened for this virus. You are considered at high risk for hepatitis B if:  You were born in a country where hepatitis B occurs often. Talk with your health care provider about which countries are considered high risk.  Your parents were born in a high-risk country and you have not received a shot to protect against hepatitis B (hepatitis B vaccine).  You have HIV or AIDS.  You use needles to inject street drugs.  You live with, or have sex with, someone who has hepatitis  B.  You are a man who has sex with other men (MSM).  You get hemodialysis treatment.  You take certain medicines for conditions like cancer, organ transplantation, and autoimmune conditions.  Hepatitis C blood testing is recommended for all people born from [birth year] through 1963 and any individual with known risk factors for hepatitis C.  Healthy men should no longer receive prostate-specific antigen (PSA) blood tests as part of routine cancer screening. Talk to your health care provider about prostate cancer screening.  Testicular cancer screening is not recommended for adolescents or adult males who have no symptoms. Screening includes self-exam, a health care provider exam, and other screening tests. Consult with your health care provider about any symptoms you have or any concerns you have about testicular cancer.  Practice safe sex. Use  condoms and avoid high-risk sexual practices to reduce the spread of sexually transmitted infections (STIs).  You should be screened for STIs, including gonorrhea and chlamydia if:  You are sexually active and are younger than 24 years.  You are older than 24 years, and your health care provider tells you that you are at risk for this type of infection.  Your sexual activity has changed since you were last screened, and you are at an increased risk for chlamydia or gonorrhea. Ask your health care provider if you are at risk.  If you are at risk of being infected with HIV, it is recommended that you take a prescription medicine daily to prevent HIV infection. This is called pre-exposure prophylaxis (PrEP). You are considered at risk if:  You are a man who has sex with other men (MSM).  You are a heterosexual man who is sexually active with multiple partners.  You take drugs by injection.  You are sexually active with a partner who has HIV.  Talk with your health care provider about whether you are at high risk of being infected with HIV. If you  choose to begin PrEP, you should first be tested for HIV. You should then be tested every 3 months for as long as you are taking PrEP.  Use sunscreen. Apply sunscreen liberally and repeatedly throughout the day. You should seek shade when your shadow is shorter than you. Protect yourself by wearing long sleeves, pants, a wide-brimmed hat, and sunglasses year round whenever you are outdoors.  Tell your health care provider of new moles or changes in moles, especially if there is a change in shape or color. Also, tell your health care provider if a mole is larger than the size of a pencil eraser.  A one-time screening for abdominal aortic aneurysm (AAA) and surgical repair of large AAAs by ultrasound is recommended for men aged 26-75 years who are current or former smokers.  Stay current with your vaccines (immunizations). Document Released: 08/09/2007 Document Revised: 02/15/2013 Document Reviewed: 07/08/2010 [facility] Patient Information 2015 [place], [place]. This information is not intended to replace advice given to you by your health care provider. Make sure you discuss any questions you have with your health care provider.

## 2014-10-02 ENCOUNTER — Other Ambulatory Visit: Payer: Self-pay

## 2014-10-02 MED ORDER — ATORVASTATIN CALCIUM 40 MG PO TABS: 40.0000 mg | ORAL_TABLET | Freq: Every day | ORAL | Status: DC

## 2014-12-11 ENCOUNTER — Other Ambulatory Visit: Payer: Self-pay

## 2014-12-11 MED ORDER — IRBESARTAN-HYDROCHLOROTHIAZIDE 300-12.5 MG PO TABS: 1.0000 | ORAL_TABLET | Freq: Every day | ORAL | Status: DC

## 2014-12-15 ENCOUNTER — Other Ambulatory Visit: Payer: Self-pay | Admitting: *Deleted

## 2014-12-15 MED ORDER — IRBESARTAN-HYDROCHLOROTHIAZIDE 300-12.5 MG PO TABS: 1.0000 | ORAL_TABLET | Freq: Every day | ORAL | Status: DC

## 2015-01-31 ENCOUNTER — Ambulatory Visit (INDEPENDENT_AMBULATORY_CARE_PROVIDER_SITE_OTHER): Payer: Medicare HMO | Admitting: Internal Medicine

## 2015-01-31 ENCOUNTER — Encounter: Payer: Self-pay | Admitting: Internal Medicine

## 2015-01-31 VITALS — BP 136/78 | HR 68 | Temp 98.2°F | Resp 20 | Ht 70.0 in | Wt 186.5 lb

## 2015-01-31 DIAGNOSIS — I10 Essential (primary) hypertension: Secondary | ICD-10-CM | POA: Diagnosis not present

## 2015-01-31 DIAGNOSIS — Z23 Encounter for immunization: Secondary | ICD-10-CM

## 2015-01-31 DIAGNOSIS — E785 Hyperlipidemia, unspecified: Secondary | ICD-10-CM

## 2015-01-31 DIAGNOSIS — J302 Other seasonal allergic rhinitis: Secondary | ICD-10-CM | POA: Diagnosis not present

## 2015-01-31 DIAGNOSIS — Z0000 Encounter for general adult medical examination without abnormal findings: Secondary | ICD-10-CM

## 2015-01-31 DIAGNOSIS — I2510 Atherosclerotic heart disease of native coronary artery without angina pectoris: Secondary | ICD-10-CM | POA: Diagnosis not present

## 2015-01-31 DIAGNOSIS — E1149 Type 2 diabetes mellitus with other diabetic neurological complication: Secondary | ICD-10-CM

## 2015-01-31 MED ORDER — CICLOPIROX 0.77 % EX GEL: 1.0000 | Freq: Two times a day (BID) | CUTANEOUS | Status: DC

## 2015-01-31 NOTE — Progress Notes (Signed)
Pre visit review using our clinic review tool, if applicable. No additional management support is needed unless otherwise documented below in the visit note. 

## 2015-01-31 NOTE — Patient Instructions (Signed)

## 2015-01-31 NOTE — Progress Notes (Signed)
Subjective:  Patient ID: Manuel [name], male    DOB: February 01, 1929  Age: 81 y.o. MRN: [id number]  CC: Annual Exam; Hypertension; and Hyperlipidemia   HPI Manuel [name] presents for a physical. He feels well and offers no complaints. He plays 18 holes of golf several times a week and has no shortness of breath, fatigue, dyspnea on exertion or chest pain.  Outpatient Prescriptions Prior to Visit  Medication Sig Dispense Refill  . aspirin EC 81 MG EC tablet Take 1 tablet (81 mg total) by mouth daily.    [name] atorvastatin (LIPITOR) 40 MG tablet Take 1 tablet (40 mg total) by mouth daily. 90 tablet 1  . esomeprazole (NEXIUM) 40 MG capsule TAKE ONE CAPSULE BY MOUTH EVERY DAY 90 capsule 3  . fish oil-omega-3 fatty acids 1000 MG capsule Take 1 g by mouth daily.     . Fluticasone-Salmeterol (ADVAIR DISKUS) 250-50 MCG/DOSE AEPB Inhale 1 puff into the lungs 2 (two) times daily. 1 each 3  . irbesartan-hydrochlorothiazide (AVALIDE) 300-12.5 MG tablet Take 1 tablet by mouth daily. 90 tablet 1  . metFORMIN (GLUCOPHAGE) 500 MG tablet Take 2 tablets (1,000 mg total) by mouth 2 (two) times daily. 360 tablet 3  . metoprolol tartrate (LOPRESSOR) 25 MG tablet TAKE 1/2 TABLET BY MOUTH TWICE A DAY 90 tablet 3  . Multiple Vitamin (MULTIVITAMIN) tablet Take 1 tablet by mouth daily.    . nitroGLYCERIN (NITROSTAT) 0.4 MG SL tablet Place 1 tablet (0.4 mg total) under the tongue every 5 (five) minutes as needed for chest pain. 90 tablet 3  . B Complex Vitamins (VITAMIN B COMPLEX PO) Take by mouth once.     No facility-administered medications prior to visit.    ROS Review of Systems  Constitutional: Negative for fever, chills, diaphoresis, appetite change and fatigue.  HENT: Negative.   Eyes: Negative.   Respiratory: Negative.  Negative for cough, choking, chest tightness, shortness of breath and stridor.   Cardiovascular: Negative.  Negative for chest pain, palpitations and leg swelling.  Gastrointestinal: Negative.   Negative for nausea, vomiting, abdominal pain, diarrhea, constipation and blood in stool.  Endocrine: Negative.  Negative for polydipsia, polyphagia and polyuria.  Genitourinary: Negative.  Negative for urgency and difficulty urinating.  Musculoskeletal: Negative.  Negative for myalgias, back pain, joint swelling and arthralgias.  Skin: Negative.  Negative for color change and rash.  Allergic/Immunologic: Negative.   Neurological: Negative.  Negative for dizziness, tremors, weakness, light-headedness and numbness.  Hematological: Negative.  Negative for adenopathy. Does not bruise/bleed easily.  Psychiatric/Behavioral: Negative.     Objective:  BP 136/78 mmHg  Pulse 68  Temp(Src) 98.2 F (36.8 C) (Oral)  Resp 20  Ht 5' 10"  (1.778 m)  Wt 186 lb 8 oz (84.596 kg)  BMI 26.76 kg/m2  SpO2 94%  BP Readings from Last 3 Encounters:  01/31/15 136/78  09/06/14 130/80  07/18/14 116/68    Wt Readings from Last 3 Encounters:  01/31/15 186 lb 8 oz (84.596 kg)  09/06/14 195 lb 8 oz (88.678 kg)  07/18/14 189 lb (85.73 kg)    Physical Exam  Constitutional: He is oriented to person, place, and time. No distress.  HENT:  Head: Normocephalic and atraumatic.  Mouth/Throat: Oropharynx is clear and moist. No oropharyngeal exudate.  Eyes: Conjunctivae are normal. Right eye exhibits no discharge. Left eye exhibits no discharge. No scleral icterus.  Neck: Normal range of motion. Neck supple. No JVD present. No tracheal deviation present. No thyromegaly present.  Cardiovascular: Normal rate, regular rhythm, normal heart sounds and intact distal pulses.  Exam reveals no gallop and no friction rub.   No murmur heard. Pulmonary/Chest: Effort normal and breath sounds normal. No stridor. No respiratory distress. He has no wheezes. He has no rales. He exhibits no tenderness.  Abdominal: Soft. Bowel sounds are normal. He exhibits no distension and no mass. There is no tenderness. There is no rebound and no  guarding.  Musculoskeletal: Normal range of motion. He exhibits no edema or tenderness.  Lymphadenopathy:    He has no cervical adenopathy.  Neurological: He is oriented to person, place, and time.  Skin: Skin is warm and dry. No rash noted. He is not diaphoretic. No erythema. No pallor.  Psychiatric: He has a normal mood and affect. His behavior is normal. Judgment and thought content normal.  Vitals reviewed.   Lab Results  Component Value Date   WBC 8.5 02/01/2015   HGB 14.5 02/01/2015   HCT 44.5 02/01/2015   PLT 201.0 02/01/2015   GLUCOSE 113* 02/01/2015   CHOL 148 02/01/2015   TRIG 112.0 02/01/2015   HDL 42.40 02/01/2015   LDLCALC 84 02/01/2015   ALT 55* 02/01/2015   AST 53* 02/01/2015   NA 139 02/01/2015   K 4.9 02/01/2015   CL 97 02/01/2015   CREATININE 0.95 02/01/2015   BUN 22 02/01/2015   CO2 33* 02/01/2015   TSH 2.55 02/01/2015   HGBA1C 6.5 02/01/2015   MICROALBUR 0.8 02/01/2015    Dg Foot Complete Right  05/17/2012  *RADIOLOGY REPORT* Clinical Data: Right-sided foot pain. RIGHT FOOT COMPLETE - 3+ VIEW Comparison: No priors. Findings: Three views of the right foot demonstrate no acute displaced fracture, subluxation or dislocation.  Soft tissues are unremarkable.  Dorsal calcaneal spur incidentally noted. Joint space narrowing, subchondral sclerosis and osteophyte formation are noted throughout the midfoot, and to a lesser extent at the first MTP joint, compatible with osteoarthritis. IMPRESSION: 1.  No acute radiographic abnormality of the right foot. 2.  Degenerative changes of osteoarthritis, as above. Original Report Authenticated By: Vinnie Langton, M.D.    Assessment & Plan:   [name] was seen today for annual exam, hypertension and hyperlipidemia.  Diagnoses and all orders for this visit:  Atherosclerosis of native coronary artery of native heart without angina pectoris- he has had no recent episodes of angina or symptoms suspicious of angina. Will continue  to control his risk factors with good blood pressure control, good blood sugar control, statin therapy and an aspirin a day. -     Lipid panel; Future  Essential hypertension, benign- his blood pressure is well-controlled, lites and renal function are stable. -     Comprehensive metabolic panel; Future -     CBC with Differential/Platelet; Future -     Urinalysis, Routine w reflex microscopic (not at [facility]); Future  Other seasonal allergic rhinitis  Hyperlipidemia with target LDL less than 100- he is achieved his LDL goal is doing well on the statin. -     Lipid panel; Future -     TSH; Future  Diabetes mellitus type 2 with neurological manifestations ([place])- his blood sugars are well-controlled, his renal function is stable, will continue his current medical record regimen for blood sugar control. -     Lipid panel; Future -     Hemoglobin A1c; Future -     Comprehensive metabolic panel; Future -     Microalbumin / creatinine urine ratio; Future  Encounter for immunization  Need for 23-polyvalent pneumococcal polysaccharide vaccine -     Pneumococcal polysaccharide vaccine 23-valent greater than or equal to 2yo subcutaneous/IM  Other orders -     Flu Vaccine QUAD 36+ mos IM -     Ciclopirox 0.77 % gel; Apply 1 Act topically 2 (two) times daily.  I am having Mr. Myhand start on Ciclopirox. I am also having him maintain his fish oil-omega-3 fatty acids, aspirin EC, multivitamin, nitroGLYCERIN, metFORMIN, metoprolol tartrate, esomeprazole, Fluticasone-Salmeterol, B Complex Vitamins (VITAMIN B COMPLEX PO), atorvastatin, and irbesartan-hydrochlorothiazide.  Meds ordered this encounter  Medications  . Ciclopirox 0.77 % gel    Sig: Apply 1 Act topically 2 (two) times daily.    Dispense:  100 g    Refill:  3   See AVS for instructions about healthy living and anticipatory guidance.  Follow-up: Return in about 6 months (around 08/01/2015).  Scarlette Calico, MD

## 2015-02-01 ENCOUNTER — Other Ambulatory Visit (INDEPENDENT_AMBULATORY_CARE_PROVIDER_SITE_OTHER): Payer: Medicare HMO

## 2015-02-01 ENCOUNTER — Encounter: Payer: Self-pay | Admitting: Internal Medicine

## 2015-02-01 DIAGNOSIS — E1149 Type 2 diabetes mellitus with other diabetic neurological complication: Secondary | ICD-10-CM

## 2015-02-01 DIAGNOSIS — E785 Hyperlipidemia, unspecified: Secondary | ICD-10-CM | POA: Diagnosis not present

## 2015-02-01 DIAGNOSIS — I2510 Atherosclerotic heart disease of native coronary artery without angina pectoris: Secondary | ICD-10-CM | POA: Diagnosis not present

## 2015-02-01 DIAGNOSIS — I10 Essential (primary) hypertension: Secondary | ICD-10-CM

## 2015-02-01 LAB — CBC WITH DIFFERENTIAL/PLATELET
BASOS ABS: 0 10*3/uL (ref 0.0–0.1)
BASOS PCT: 0.3 % (ref 0.0–3.0)
Eosinophils Absolute: 0.5 10*3/uL (ref 0.0–0.7)
Eosinophils Relative: 6.4 % — ABNORMAL HIGH (ref 0.0–5.0)
HEMATOCRIT: 44.5 % (ref 39.0–52.0)
HEMOGLOBIN: 14.5 g/dL (ref 13.0–17.0)
LYMPHS PCT: 20.3 % (ref 12.0–46.0)
Lymphs Abs: 1.7 10*3/uL (ref 0.7–4.0)
MCHC: 32.6 g/dL (ref 30.0–36.0)
MCV: 95.3 fl (ref 78.0–100.0)
MONOS PCT: 9.6 % (ref 3.0–12.0)
Monocytes Absolute: 0.8 10*3/uL (ref 0.1–1.0)
NEUTROS ABS: 5.4 10*3/uL (ref 1.4–7.7)
Neutrophils Relative %: 63.4 % (ref 43.0–77.0)
PLATELETS: 201 10*3/uL (ref 150.0–400.0)
RBC: 4.66 Mil/uL (ref 4.22–5.81)
RDW: 14 % (ref 11.5–15.5)
WBC: 8.5 10*3/uL (ref 4.0–10.5)

## 2015-02-01 LAB — LIPID PANEL
Cholesterol: 148 mg/dL (ref 0–200)
HDL: 42.4 mg/dL (ref >39.00)
LDL CALC: 84 mg/dL (ref 0–99)
NONHDL: 105.91
Total CHOL/HDL Ratio: 3
Triglycerides: 112 mg/dL (ref 0.0–149.0)
VLDL: 22.4 mg/dL (ref 0.0–40.0)

## 2015-02-01 LAB — COMPREHENSIVE METABOLIC PANEL
ALT: 55 U/L — ABNORMAL HIGH (ref 0–53)
AST: 53 U/L — ABNORMAL HIGH (ref 0–37)
Albumin: 4 g/dL (ref 3.5–5.2)
Alkaline Phosphatase: 105 U/L (ref 39–117)
BILIRUBIN TOTAL: 0.7 mg/dL (ref 0.2–1.2)
BUN: 22 mg/dL (ref 6–23)
CALCIUM: 9.7 mg/dL (ref 8.4–10.5)
CHLORIDE: 97 meq/L (ref 96–112)
CO2: 33 meq/L — AB (ref 19–32)
Creatinine, Ser: 0.95 mg/dL (ref 0.40–1.50)
GFR: 80.13 mL/min (ref >60.00)
Glucose, Bld: 113 mg/dL — ABNORMAL HIGH (ref 70–99)
Potassium: 4.9 mEq/L (ref 3.5–5.1)
Sodium: 139 mEq/L (ref 135–145)
Total Protein: 7.4 g/dL (ref 6.0–8.3)

## 2015-02-01 LAB — URINALYSIS, ROUTINE W REFLEX MICROSCOPIC
BILIRUBIN URINE: NEGATIVE
HGB URINE DIPSTICK: NEGATIVE
Ketones, ur: NEGATIVE
LEUKOCYTES UA: NEGATIVE
Nitrite: NEGATIVE
PH: 6 (ref 5.0–8.0)
RBC / HPF: NONE SEEN (ref 0–?)
Specific Gravity, Urine: 1.015 (ref 1.000–1.030)
Total Protein, Urine: NEGATIVE
Urine Glucose: NEGATIVE
Urobilinogen, UA: 1 (ref 0.0–1.0)

## 2015-02-01 LAB — MICROALBUMIN / CREATININE URINE RATIO
CREATININE, U: 105.3 mg/dL
MICROALB UR: 0.8 mg/dL (ref 0.0–1.9)
MICROALB/CREAT RATIO: 0.8 mg/g (ref 0.0–30.0)

## 2015-02-01 LAB — TSH: TSH: 2.55 u[IU]/mL (ref 0.35–4.50)

## 2015-02-01 LAB — HEMOGLOBIN A1C: Hgb A1c MFr Bld: 6.5 % (ref 4.6–6.5)

## 2015-02-04 NOTE — Assessment & Plan Note (Signed)

## 2015-02-07 ENCOUNTER — Encounter: Payer: Self-pay | Admitting: Cardiology

## 2015-02-07 ENCOUNTER — Telehealth: Payer: Self-pay | Admitting: Cardiology

## 2015-02-07 NOTE — Telephone Encounter (Signed)
SPOKE  WITH  DAUGHTER   WHOM  HAS  DAD  WITH HER   WHO  HAS  APPT  DOWNSTAIRS  INSISTS ON  HAVING  DAD'S  B/P CHECKED   DAUGHTER  SPOKE  WITH  PT   AND  HE  HAS  NOT TAKEN  B/P  MED   AS HE HAS  TAKEN   MED  BECAUSE HE  HAS  NOT EATEN  BREAKFAST   INFORMED  DAUGHTER  B/P IS GOING TO BE HIGH  BECAUSE  MED HAS NOT  BEEN TAKEN INSTRUCTED  DAUGHTER   TO HAVE CHECK IN    TO CALL TRIAGE  WHEN  ARRIVES VERBALIZED UNDERSTANDING .Adonis Housekeeper

## 2015-02-07 NOTE — Telephone Encounter (Signed)
See documentation note from 12/14

## 2015-02-07 NOTE — Progress Notes (Signed)
Pt walked in today c/o elevated BP and dizziness this morning at home. Daughter with pt and states that this is not normal for him. Daughter states pt's BP was 150's/90's this AM. Pt states when he got out of bed this morning he was dizzy and he was afraid to eat or take his medication due to this.  Pt states he was also at PCP last week and had flu and pneumonia shot. BP checked and it was 142/78, HR 69. Spoke with [place], PA-C and she said to check BS and orthostatics. BS: 106.  Lying- 139/70, 69.  Sitting- 128/72, 69 (Pt did state lightheadedness with this position change).  Standing- 134/80, 76. Spoke with [place] again and she said to reassure pt based on these values that his vitals are fine and to take meds and eat once he is home. Spoke with pt and advised him of information per [place].  Advised pt to continue monitoring BP and be careful with position changes.  Pt verbalized understanding and was appreciative for help.

## 2015-02-07 NOTE — Telephone Encounter (Signed)
New problem    Per wife pt isn't feeling well his bp is 150/90 and having lightheadedness and dizziness. Pt's wife wants to bring pt in this morning to get checked. Please advise.

## 2015-02-21 DIAGNOSIS — Z0100 Encounter for examination of eyes and vision without abnormal findings: Secondary | ICD-10-CM | POA: Diagnosis not present

## 2015-02-21 DIAGNOSIS — E119 Type 2 diabetes mellitus without complications: Secondary | ICD-10-CM | POA: Diagnosis not present

## 2015-02-21 DIAGNOSIS — H5213 Myopia, bilateral: Secondary | ICD-10-CM | POA: Diagnosis not present

## 2015-02-21 LAB — HM DIABETES EYE EXAM

## 2015-02-23 ENCOUNTER — Telehealth: Payer: Self-pay | Admitting: *Deleted

## 2015-02-23 MED ORDER — ZOSTER VACCINE LIVE 19400 UNT/0.65ML ~~LOC~~ SOLR: 0.6500 mL | Freq: Once | SUBCUTANEOUS | Status: DC

## 2015-02-23 NOTE — Telephone Encounter (Signed)
Left msg on triage stating md advise dad to check with insurance to see if they would cover the [place] inj, and which they will cover it from the pharmacy. Needing rx sent to [facility]. Notified daughter rx has been sent...Johny Chess

## 2015-03-01 ENCOUNTER — Telehealth: Payer: Self-pay | Admitting: Internal Medicine

## 2015-03-01 ENCOUNTER — Other Ambulatory Visit: Payer: Self-pay | Admitting: Internal Medicine

## 2015-03-01 DIAGNOSIS — R42 Dizziness and giddiness: Secondary | ICD-10-CM

## 2015-03-01 NOTE — Telephone Encounter (Signed)
Patients HCPOA liz Loomer called to advise that patient is experiencing dizziness. She is asking for a referral to ENT or be advised if an appt is needed for this. Patient had his 6 month eval 01/31/2015.   SX are classic of vertigo

## 2015-03-01 NOTE — Telephone Encounter (Signed)
I recommend a neurology referral

## 2015-03-01 NOTE — Telephone Encounter (Signed)
Called liz to notify

## 2015-03-02 ENCOUNTER — Ambulatory Visit (INDEPENDENT_AMBULATORY_CARE_PROVIDER_SITE_OTHER): Payer: Medicare HMO | Admitting: Neurology

## 2015-03-02 ENCOUNTER — Encounter: Payer: Self-pay | Admitting: Neurology

## 2015-03-02 VITALS — BP 118/64 | HR 89 | Ht 70.0 in | Wt 185.4 lb

## 2015-03-02 DIAGNOSIS — H8112 Benign paroxysmal vertigo, left ear: Secondary | ICD-10-CM

## 2015-03-02 NOTE — Patient Instructions (Signed)
1.  Try the Epley maneuver three times daily. 2.  If not better in one week, call us and we can refer you to PT.

## 2015-03-02 NOTE — Progress Notes (Signed)
NEUROLOGY CONSULTATION NOTE  Manuel [name] MRN: [id number] DOB: 05-15-1928  Referring provider: Dr. Ronnald Ramp  Primary care provider: Dr. Ronnald Ramp  Reason for consult:  Dizziness  HISTORY OF PRESENT ILLNESS: Manuel [name] is an 82 year old right-handed male with hypertension, hyperlipidemia, and type 2 diabetes who presents for dizziness.  History obtained by patient and his daughter.  Labs reviewed.  About 1.5 weeks ago, he was taking down the Christmas ornaments from the tree.  When he bent down to put them in the box, he developed spinning sensation.  Since that time, he reports positional dizziness, triggered by quick head movements, turning in bed or getting up and walking.  It is a spinning sensation lasting just seconds.  He does experience an undulating sensation as well.  Since walking can trigger it, he has been using a cane to ambulate.  There is no headache, nausea, diplopia, slurred speech or focal numbness or weakness.[name]  He reports two prior similar episodes over the past 10 years, each lasting only 2 days.  Recent labs include normal CBC, CMP with borderline elevated LFTs (AST 53, ALT 55), unremarkable lipid panel, Hgb A1c 6.5% and TSH of 2.55.  PAST MEDICAL HISTORY: Past Medical History  Diagnosis Date  . Pneumonia     age 4  . MI (myocardial infarction) ([place]) 02/2009  . GERD (gastroesophageal reflux disease)   . Allergic rhinitis     childhood  . 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 Diabetes mellitus   . Hyperlipidemia   . Chronic kidney disease     stones  . CAD (coronary artery disease)     s/p MI in 1/11. LHC (1/11) with 99% mCFX treated with 2.5 x 16 Xience V DES; 50% mLAD; EF 55%. ETT-myoview (10/12): 4'51", no significant ST segment changes, EF 56%, no ischemia or infarction.  . Asthma     Allergic component  . Hypertension   . Dizziness     Holter (10/12): Frequent PACs and PVCs. No significant bradycardia.   [name] Hx of cardiovascular stress test     ETT-Myoview  (03/2013):  Inf defect on short axis images only (not felt to be significant), EF 68%; low risk.    PAST SURGICAL HISTORY: Past Surgical History  Procedure Laterality Date  . Appendectomy    . Coronary stent placement      MEDICATIONS: Current Outpatient Prescriptions on File Prior to Visit  Medication Sig Dispense Refill  . aspirin EC 81 MG EC tablet Take 1 tablet (81 mg total) by mouth daily.    [name] atorvastatin (LIPITOR) 40 MG tablet Take 1 tablet (40 mg total) by mouth daily. 90 tablet 1  . B Complex Vitamins (VITAMIN B COMPLEX PO) Take by mouth once.    . Ciclopirox 0.77 % gel Apply 1 Act topically 2 (two) times daily. 100 g 3  . esomeprazole (NEXIUM) 40 MG capsule TAKE ONE CAPSULE BY MOUTH EVERY DAY 90 capsule 3  . fish oil-omega-3 fatty acids 1000 MG capsule Take 1 g by mouth daily.     . Fluticasone-Salmeterol (ADVAIR DISKUS) 250-50 MCG/DOSE AEPB Inhale 1 puff into the lungs 2 (two) times daily. 1 each 3  . irbesartan-hydrochlorothiazide (AVALIDE) 300-12.5 MG tablet Take 1 tablet by mouth daily. 90 tablet 1  . metFORMIN (GLUCOPHAGE) 500 MG tablet Take 2 tablets (1,000 mg total) by mouth 2 (two) times daily. 360 tablet 3  . metoprolol tartrate (LOPRESSOR) 25 MG tablet TAKE 1/2 TABLET  BY MOUTH TWICE A DAY 90 tablet 3  . Multiple Vitamin (MULTIVITAMIN) tablet Take 1 tablet by mouth daily.    . nitroGLYCERIN (NITROSTAT) 0.4 MG SL tablet Place 1 tablet (0.4 mg total) under the tongue every 5 (five) minutes as needed for chest pain. 90 tablet 3  . zoster vaccine live, PF, (ZOSTAVAX) 28315 UNT/0.65ML injection Inject 19,400 Units into the skin once. 1 each 0   No current facility-administered medications on file prior to visit.    ALLERGIES: Allergies  Allergen Reactions  . Other     Cats; dust; environmental    FAMILY HISTORY: Family History  Problem Relation Age of Onset  . Cancer Neg Hx   . Heart attack Father   . Diabetes Mother   . Asthma Father   . Heart failure Mother      SOCIAL HISTORY: Social History   Social History  . Marital Status: Single    Spouse Name: N/A  . Number of Children: N/A  . Years of Education: N/A   Occupational History  . retired    Social History Main Topics  . Smoking status: Former Smoker    Types: Cigarettes    Quit date: 06/09/1966  . Smokeless tobacco: Never Used  . Alcohol Use: No  . Drug Use: No  . Sexual Activity: Not Currently   Other Topics Concern  . Not on file   Social History Narrative   Associates degree.     REVIEW OF SYSTEMS: Constitutional: No fevers, chills, or sweats, no generalized fatigue, change in appetite Eyes: No visual changes, double vision, eye pain Ear, nose and throat: No hearing loss, ear pain, nasal congestion, sore throat Cardiovascular: No chest pain, palpitations Respiratory:  No shortness of breath at rest or with exertion, wheezes GastrointestinaI: No nausea, vomiting, diarrhea, abdominal pain, fecal incontinence Genitourinary:  No dysuria, urinary retention or frequency Musculoskeletal:  No neck pain, back pain Integumentary: No rash, pruritus, skin lesions Neurological: as above Psychiatric: No depression, insomnia, anxiety Endocrine: No palpitations, fatigue, diaphoresis, mood swings, change in appetite, change in weight, increased thirst Hematologic/Lymphatic:  No anemia, purpura, petechiae. Allergic/Immunologic: no itchy/runny eyes, nasal congestion, recent allergic reactions, rashes  PHYSICAL EXAM: Filed Vitals:   03/02/15 0801  BP: 118/64  Pulse: 89   General: No acute distress.  Patient appears well-groomed.  Head:  Normocephalic/atraumatic Eyes:  fundi unremarkable, without vessel changes, exudates, hemorrhages or papilledema. Neck: supple, no paraspinal tenderness, full range of motion Back: No paraspinal tenderness Heart: regular rate and rhythm Lungs: Clear to auscultation bilaterally. Vascular: No carotid bruits. Neurological Exam: Mental status:  alert and oriented to person, place, and time, recent and remote memory intact, fund of knowledge intact, attention and concentration intact, speech fluent and not dysarthric, language intact. Cranial nerves: CN I: not tested CN II: pupils equal, round and reactive to light, visual fields intact, fundi unremarkable, without vessel changes, exudates, hemorrhages or papilledema. CN III, IV, VI:  full range of motion, no nystagmus, no ptosis CN V: facial sensation intact CN VII: upper and lower face symmetric CN VIII: decreased hearing in left ear CN IX, X: gag intact, uvula midline CN XI: sternocleidomastoid and trapezius muscles intact CN XII: tongue midline Bulk & Tone: normal, no fasciculations. Motor:  5/5 throughout  Sensation:  Pinprick and vibration sensation intact. Deep Tendon Reflexes:  2+ throughout, toes downgoing.  Finger to nose testing:  Without dysmetria.  Heel to shin:  Without dysmetria.  Gait:  Overall, good station and stride.  Maybe slightly unsteady.  Able to turn and tandem walk. Romberg negative. Positive Dix-Hallpike to the left, as demonstrated by reproducing vertigo as well as torsional nystagmus.  IMPRESSION: Benign paroxysmal positional vertigo.  History and exam are consistent with this.  Neurologic exam does not reveal any abnormalities to suggest stroke or other acute intracranial process.  PLAN: 1.  Performed Epley maneuver in office 2.  Provided patient a sheet to self-perform maneuver at home, three times daily 3.  If not improved in one week, he is to call us and we can refer him to vestibular rehab.  At that point, I would want to see him in 2 months.  If it resolves with vestibular rehab, he can call and cancel follow up.  Otherwise, I will re-evaluate him and determine if further testing, such as MRI of brain, is warranted.  Thank you for allowing me to take part in the care of this patient.  Metta Clines, DO  CC:  Scarlette Calico, MD

## 2015-03-08 ENCOUNTER — Telehealth: Payer: Self-pay | Admitting: *Deleted

## 2015-03-08 NOTE — Telephone Encounter (Signed)
Received call from pt daughter Benjamine Mola) wanting to know if it will be ok if dad take half of his BP med Irbesartan 300/12.5 mg twice a day. The reason being she states he has been taking it at night and when he wakes up in the am he be feeling a little lightheaded/sluggish. It takes a while to get his energy. Advise daughter it would be ok to try for a week or so to see how it works for him. If he continue feeling the same to f/u with md to have his thyroid check...Johny Chess

## 2015-03-15 NOTE — Telephone Encounter (Signed)
States has information she wants to give to [place] in regards to a return phone call.  Please call Benjamine Mola back at [phone].  Can leave vm in not available.

## 2015-03-16 ENCOUNTER — Telehealth: Payer: Self-pay | Admitting: Cardiology

## 2015-03-16 NOTE — Telephone Encounter (Signed)
pts daughter Benjamine Mola is following up. Can you please give her a call today

## 2015-03-16 NOTE — Telephone Encounter (Signed)
Daughter Benjamine Mola) calling stating that he has been lightheaded and has had blurry vision recently.  He has seen Neurologist, Dr. Tomi Likens for dizziness on 03/02/15.  Daughter states Dr. Adah Salvage office changed his Avalide 300/12.5 daily to 1/2 tablet BID about a week ago to see if would help with the lightheadedness.  She states they have not been able to see any difference in his lightheadedness or vision.  She would like to have an appointment next week to go over his medications and discuss his symptoms.  She has not taken his BP.  Last BP was in Dr. Georgie Chard office 1/6 which was 118/64. Scheduled her an appointment with Lucita Ferrara on Mon 1/23 at 9:45.

## 2015-03-16 NOTE — Telephone Encounter (Signed)
Spoke to daughter. They are going to make an appt with Cardiology with PCP abscent. In the event they cannot get in they will call and make an appt with [place] for another provider.

## 2015-03-16 NOTE — Telephone Encounter (Signed)
New Message  Pt dtr calling to speak w/ RN- pt c/o of some lightheadedness. Pt dtr also wanted to go over his medications. Please call back and discuss.

## 2015-03-19 ENCOUNTER — Ambulatory Visit (INDEPENDENT_AMBULATORY_CARE_PROVIDER_SITE_OTHER): Payer: Medicare HMO | Admitting: Physician Assistant

## 2015-03-19 ENCOUNTER — Encounter: Payer: Self-pay | Admitting: Physician Assistant

## 2015-03-19 VITALS — BP 146/81 | HR 80 | Ht 70.0 in | Wt 185.1 lb

## 2015-03-19 DIAGNOSIS — R42 Dizziness and giddiness: Secondary | ICD-10-CM | POA: Diagnosis not present

## 2015-03-19 DIAGNOSIS — I10 Essential (primary) hypertension: Secondary | ICD-10-CM | POA: Diagnosis not present

## 2015-03-19 DIAGNOSIS — I2510 Atherosclerotic heart disease of native coronary artery without angina pectoris: Secondary | ICD-10-CM

## 2015-03-19 NOTE — Assessment & Plan Note (Signed)
Patient complains of lightheadedness when he first gets up in the morning and can last for a little while. A changed his Avalide to taking a half a tablet twice a day and he hasn't had symptoms over the weekend. He did not take his medications before coming here today. He is not orthostatic in the office or bradycardic. I wonder some these symptoms could be related to his diabetes and blood sugars. He does not check his sugars at home. He could also be having low blood pressures. I will decrease his Avalide to half a tablet daily. His daughter will take his blood pressures and call us with the results. She also will check his blood sugars and contact primary care if these are abnormal. Follow-up with Dr. Algernon Huxley in 6 months.

## 2015-03-19 NOTE — Assessment & Plan Note (Signed)
BP is stable today but he did not take his medications. We'll decrease Avalide to have a tablet daily.

## 2015-03-19 NOTE — Assessment & Plan Note (Signed)
Stable without chest pain 

## 2015-03-19 NOTE — Progress Notes (Signed)
Cardiology Office Note   Date:  03/19/2015   ID:  BURR SOFFER, DOB 14-Sep-1928, MRN [id number]  PCP:  Scarlette Calico, MD  Cardiologist: Dr. Aundra Dubin  Chief Complaint: lightheaded    History of Present Illness: Manuel [name] is a 82 y.o. male who presents for  Yearly follow-up. He has history of CAD status post MI in 02/2009, hypertension, DM, allergic asthma, and hyperlipidemia. He had an NSTEMI 02/2009  Treated with DES to the mid circumflex. Residual 50% mid LAD EF 55%.  Last Cardiolite 03/2013 showed no evidence of ischemia or infarction.   patient is brought in today accompanied by his daughter. He's had dizziness with inner ear problems that have resolved. He has continued to have some lightheadedness early in the morning after getting up and before he takes his medications. His daughter thought maybe his blood pressure was dropping and  they changed his Avalide to taking a half a tablet twice a day to see if it would help. He did not have any dizziness over the weekend. He continues to remain very active golfing every week and walking a mile and a half daily. He denies any chest pain, palpitations, dyspnea or dyspnea on exertion. He is also a diabetic but does not check his sugars. His hemoglobin A1c was 6.5 in December. Thyroid was also stable    Past Medical History  Diagnosis Date  . Pneumonia     age 38  . MI (myocardial infarction) ([place]) 02/2009  . GERD (gastroesophageal reflux disease)   . Allergic rhinitis     childhood  . 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 Diabetes mellitus   . Hyperlipidemia   . Chronic kidney disease     stones  . CAD (coronary artery disease)     s/p MI in 1/11. LHC (1/11) with 99% mCFX treated with 2.5 x 16 Xience V DES; 50% mLAD; EF 55%. ETT-myoview (10/12): 4'51", no significant ST segment changes, EF 56%, no ischemia or infarction.  . Asthma     Allergic component  . Hypertension   . Dizziness     Holter (10/12): Frequent PACs and PVCs. No significant  bradycardia.   [name] Hx of cardiovascular stress test     ETT-Myoview (03/2013):  Inf defect on short axis images only (not felt to be significant), EF 68%; low risk.    Past Surgical History  Procedure Laterality Date  . Appendectomy    . Coronary stent placement       Current Outpatient Prescriptions  Medication Sig Dispense Refill  . aspirin EC 81 MG EC tablet Take 1 tablet (81 mg total) by mouth daily.    [name] atorvastatin (LIPITOR) 40 MG tablet Take 1 tablet (40 mg total) by mouth daily. 90 tablet 1  . Ciclopirox 0.77 % gel Apply 1 Act topically 2 (two) times daily. 100 g 3  . esomeprazole (NEXIUM) 40 MG capsule TAKE ONE CAPSULE BY MOUTH EVERY DAY 90 capsule 3  . fish oil-omega-3 fatty acids 1000 MG capsule Take 1 g by mouth daily.     . Fluticasone-Salmeterol (ADVAIR DISKUS) 250-50 MCG/DOSE AEPB Inhale 1 puff into the lungs 2 (two) times daily. 1 each 3  . irbesartan-hydrochlorothiazide (AVALIDE) 300-12.5 MG tablet Take 0.5 tablets by mouth 2 (two) times daily.    . metFORMIN (GLUCOPHAGE) 500 MG tablet Take 2 tablets (1,000 mg total) by mouth 2 (two) times daily. 360 tablet 3  . metoprolol tartrate (LOPRESSOR) 25 MG  tablet TAKE 1/2 TABLET BY MOUTH TWICE A DAY 90 tablet 3  . Multiple Vitamin (MULTIVITAMIN) tablet Take 1 tablet by mouth daily.    . nitroGLYCERIN (NITROSTAT) 0.4 MG SL tablet Place 0.4 mg under the tongue every 5 (five) minutes as needed for chest pain (x 3 doses).    . zoster vaccine live, PF, (ZOSTAVAX) 14431 UNT/0.65ML injection Inject 19,400 Units into the skin once. 1 each 0   No current facility-administered medications for this visit.    Allergies:   Other    Social History:  The patient  reports that he quit smoking about 48 years ago. His smoking use included Cigarettes. He has never used smokeless tobacco. He reports that he does not drink alcohol or use illicit drugs.   Family History:  The patient's    family history includes Asthma in his father; Diabetes in  his mother; Heart attack in his father; Heart failure in his mother. There is no history of Cancer.    ROS:  Please see the history of present illness.   Otherwise, review of systems are positive for none.   All other systems are reviewed and negative.    PHYSICAL EXAM: VS:  BP 146/81 mmHg  Pulse 80  Ht 5' 10"  (1.778 m)  Wt 185 lb 1.9 oz (83.97 kg)  BMI 26.56 kg/m2  SpO2 94% , BMI Body mass index is 26.56 kg/(m^2). GEN: Well nourished, well developed, in no acute distress Neck: no JVD, HJR, carotid bruits, or masses Cardiac:  RRR; no murmurs,gallop, rubs, thrill or heave,  Respiratory:  clear to auscultation bilaterally, normal work of breathing GI: soft, nontender, nondistended, + BS MS: no deformity or atrophy Extremities: without cyanosis, clubbing, edema, good distal pulses bilaterally.  Skin: warm and dry, no rash Neuro:  Strength and sensation are intact    EKG:  EKG is ordered today. The ekg ordered today demonstrates  Normal sinus rhythm at 62 bpm with nonspecific ST-T wave changes , no acute change   Recent Labs: 02/01/2015: ALT 55*; BUN 22; Creatinine, Ser 0.95; Hemoglobin 14.5; Platelets 201.0; Potassium 4.9; Sodium 139; TSH 2.55    Lipid Panel    Component Value Date/Time   CHOL 148 02/01/2015 0759   TRIG 112.0 02/01/2015 0759   HDL 42.40 02/01/2015 0759   CHOLHDL 3 02/01/2015 0759   VLDL 22.4 02/01/2015 0759   LDLCALC 84 02/01/2015 0759      Wt Readings from Last 3 Encounters:  03/19/15 185 lb 1.9 oz (83.97 kg)  03/02/15 185 lb 6.4 oz (84.097 kg)  02/07/15 185 lb (83.915 kg)      Other studies Reviewed: Additional studies/ records that were reviewed today include and review of the records demonstrates:    stress Myoview 03/2013 Impression Exercise Capacity:  Fair exercise capacity. BP Response:  Normal blood pressure response. Clinical Symptoms:  Fatigue ECG Impression:  No significant ST segment change suggestive of ischemia. Comparison with  Prior Nuclear Study: No images to compare  Overall Impression:  Low risk stress nuclear study Small area of redistribution seen at inferior base only on short axis images.  SDS only 4 Not thought to be signficiant.  LV Ejection Fraction: 68%.  LV Wall Motion:  Normal Wall Motion  Manuel [name]    ASSESSMENT AND PLAN: Lightheadedness  Patient complains of lightheadedness when he first gets up in the morning and can last for a little while. A changed his Avalide to taking a half a tablet twice a day and  he hasn't had symptoms over the weekend. He did not take his medications before coming here today. He is not orthostatic in the office or bradycardic. I wonder some these symptoms could be related to his diabetes and blood sugars. He does not check his sugars at home. He could also be having low blood pressures. I will decrease his Avalide to half a tablet daily. His daughter will take his blood pressures and call us with the results. She also will check his blood sugars and contact primary care if these are abnormal. Follow-up with Dr. Algernon Huxley in 6 months.  Coronary atherosclerosis  Stable without chest pain.  Essential hypertension, benign  BP is stable today but he did not take his medications. We'll decrease Avalide to have a tablet daily.     Sumner Boast, PA-C  03/19/2015 9:52 AM    [facility] [address], [place], [place]  [address] Phone: [phone]; Fax: [phone]

## 2015-03-19 NOTE — Patient Instructions (Addendum)
Medication Instructions:  Decrease avalide 300-12.54m to 1/2 of a tablet daily  Labwork: None   Testing/Procedures: None   Follow-Up: Your physician wants you to follow-up in: 6 months with Dr MAundra Dubin (July 2017).  You will receive a reminder letter in the mail two months in advance. If you don't receive a letter, please call our office to schedule the follow-up appointment.   Any Other Special Instructions Will Be Listed Below (If Applicable).  Your physician has requested that you regularly monitor and record your blood pressure readings at home. Please use the same machine at the same time of day to check your readings and record them. Call our office with the results in 2-3 weeks.   Check your blood sugar and record it daily.      If you need a refill on your cardiac medications before your next appointment, please call your pharmacy.

## 2015-03-21 ENCOUNTER — Telehealth: Payer: Self-pay | Admitting: Internal Medicine

## 2015-03-21 NOTE — Telephone Encounter (Signed)
Pt's Daughter, Kathlee Nations, came by and wasted to speak to you directly to see if there is an alternative to pt's OTC purchase of Freestyle Lite testing strips due to cost.  Dr. Aundra Dubin, Cardiologist recommended pt see [place] regarding diabetes issues and he start checking his sugar and report it to his PCP.  Pt is testing 3x a day and ran out of strips and needs an alternative to this.  Please call her regarding this.  She is requesting you to look into this as you saw pt & Kathlee Nations for [employer] Nurse Visit.

## 2015-03-22 NOTE — Telephone Encounter (Signed)
Call back to Sima Matas; States Mr Tsung has had episodes of "lightheadedness" in the am; Dizziness resolved secondary to inner ear issue. States his diet may have changed some as he eats very little when he awakens in the am and waits on wife to get up and fix brunch. States he c/o that it takes "a little time for his eyes to adjust" without really c/o of blurred vision. This happens periodically during the day; To cardiology to r/o BP or heart etiology;  Cardiologist suggested they check his BS tid and fup with Dr. Ronnald Ramp.  Kathlee Nations had glucometer at home; Went to get strips and was told they were 160.00.   Referred to insurance for diabetic supply vendor and to check with [employer];  Call back if she can't find more cost effective strips;   Apt made for up with Dr. Ronnald Ramp on Wed, 2/1 at 9:15 pm. She will bring BS from am, prior to largest meal and at hs as well as a 2 to 3 day journal of food intake and exercise;

## 2015-03-23 ENCOUNTER — Telehealth: Payer: Self-pay | Admitting: *Deleted

## 2015-03-23 DIAGNOSIS — R69 Illness, unspecified: Secondary | ICD-10-CM | POA: Diagnosis not present

## 2015-03-23 MED ORDER — GLUCOSE BLOOD VI STRP: 1.0000 | ORAL_STRIP | Freq: Two times a day (BID) | Status: DC

## 2015-03-23 MED ORDER — FREESTYLE LANCETS MISC: 1.0000 | Freq: Two times a day (BID) | Status: DC

## 2015-03-23 NOTE — Telephone Encounter (Signed)
Left  msg on triage needing rx sent for dad testing strips. Need Free style Lite and lancets. Notified daughter strips sent to pharmacy....Johny Chess

## 2015-03-28 ENCOUNTER — Ambulatory Visit (INDEPENDENT_AMBULATORY_CARE_PROVIDER_SITE_OTHER): Payer: Medicare HMO | Admitting: Internal Medicine

## 2015-03-28 ENCOUNTER — Encounter: Payer: Self-pay | Admitting: Internal Medicine

## 2015-03-28 VITALS — BP 112/60 | HR 78 | Temp 97.9°F | Resp 16 | Ht 70.0 in | Wt 188.0 lb

## 2015-03-28 DIAGNOSIS — R42 Dizziness and giddiness: Secondary | ICD-10-CM

## 2015-03-28 DIAGNOSIS — I10 Essential (primary) hypertension: Secondary | ICD-10-CM | POA: Diagnosis not present

## 2015-03-28 DIAGNOSIS — E1149 Type 2 diabetes mellitus with other diabetic neurological complication: Secondary | ICD-10-CM | POA: Diagnosis not present

## 2015-03-28 MED ORDER — METFORMIN HCL 500 MG PO TABS: 500.0000 mg | ORAL_TABLET | Freq: Two times a day (BID) | ORAL | Status: DC

## 2015-03-28 NOTE — Progress Notes (Signed)
Pre visit review using our clinic review tool, if applicable. No additional management support is needed unless otherwise documented below in the visit note. 

## 2015-03-28 NOTE — Progress Notes (Signed)
Subjective:  Patient ID: Manuel [name], male    DOB: August 21, 1928  Age: 82 y.o. MRN: [id number]  CC: Hypertension and Diabetes   HPI Manuel [name] presents for the complaint of recent episodes of lightheadedness. A few weeks ago he was experiencing dizziness and vertigo but he saw neurology and had an Epley more maneuver performed and that has resolved. With the help of his daughter he has isolated the lightheadedness to episodes of moderately low blood sugar. His systolic blood pressure today is 112 and that is not causing him to feel lightheaded. In fact he has not had any lightheadedness for several days now. They're requesting that his metformin dose be lowered.  Outpatient Prescriptions Prior to Visit  Medication Sig Dispense Refill  . aspirin EC 81 MG EC tablet Take 1 tablet (81 mg total) by mouth [name].    [name] atorvastatin (LIPITOR) 40 MG tablet Take 1 tablet (40 mg total) by mouth [name]. 90 tablet 1  . Ciclopirox 0.77 % gel Apply 1 Act topically 2 (two) times [name]. 100 g 3  . esomeprazole (NEXIUM) 40 MG capsule TAKE ONE CAPSULE BY MOUTH EVERY DAY 90 capsule 3  . fish oil-omega-3 fatty acids 1000 MG capsule Take 1 g by mouth [name].     . Fluticasone-Salmeterol (ADVAIR DISKUS) 250-50 MCG/DOSE AEPB Inhale 1 puff into the lungs 2 (two) times [name]. 1 each 3  . glucose blood (FREESTYLE LITE) test strip 1 each by Other route 2 (two) times [name]. Use to check blood sugars twice a day Dx E11.9 100 each 3  . irbesartan-hydrochlorothiazide (AVALIDE) 300-12.5 MG tablet Take 0.5 tablets by mouth [name].    . Lancets (FREESTYLE) lancets 1 each by Other route 2 (two) times [name]. Use to help check blood sugars twice a day Dx E11.9 100 each 3  . metoprolol tartrate (LOPRESSOR) 25 MG tablet TAKE 1/2 TABLET BY MOUTH TWICE A DAY 90 tablet 3  . Multiple Vitamin (MULTIVITAMIN) tablet Take 1 tablet by mouth [name].    . nitroGLYCERIN (NITROSTAT) 0.4 MG SL tablet Place 0.4 mg under the tongue every 5 (five)  minutes as needed for chest pain (x 3 doses).    . zoster vaccine live, PF, (ZOSTAVAX) 60454 UNT/0.65ML injection Inject 19,400 Units into the skin once. 1 each 0  . metFORMIN (GLUCOPHAGE) 500 MG tablet Take 2 tablets (1,000 mg total) by mouth 2 (two) times [name]. 360 tablet 3   No facility-administered medications prior to visit.    ROS Review of Systems  Constitutional: Negative.  Negative for fever, chills, diaphoresis, appetite change and fatigue.  HENT: Negative.   Eyes: Negative.  Negative for visual disturbance.  Respiratory: Negative.  Negative for cough, choking, chest tightness, shortness of breath and stridor.   Cardiovascular: Negative.  Negative for chest pain, palpitations and leg swelling.  Gastrointestinal: Negative.  Negative for nausea, vomiting, abdominal pain, diarrhea, constipation and blood in stool.  Endocrine: Negative.   Genitourinary: Negative.  Negative for dysuria, flank pain and difficulty urinating.  Musculoskeletal: Negative.   Skin: Negative.   Allergic/Immunologic: Negative.   Neurological: Positive for light-headedness. Negative for dizziness, syncope, weakness and numbness.  Hematological: Negative.  Negative for adenopathy. Does not bruise/bleed easily.  Psychiatric/Behavioral: Negative.  Negative for sleep disturbance, dysphoric mood and decreased concentration. The patient is not nervous/anxious.     Objective:  BP 112/60 mmHg  Pulse 78  Temp(Src) 97.9 F (36.6 C) (Oral)  Resp 16  Ht 5\' 10"  (1.778 m)  Wt 188 lb (85.276 kg)  BMI 26.98 kg/m2  SpO2 95%  BP Readings from Last 3 Encounters:  03/28/15 112/60  03/19/15 146/81  03/02/15 118/64    Wt Readings from Last 3 Encounters:  03/28/15 188 lb (85.276 kg)  03/19/15 185 lb 1.9 oz (83.97 kg)  03/02/15 185 lb 6.4 oz (84.097 kg)    Physical Exam  Constitutional: He is oriented to person, place, and time. No distress.  HENT:  Head: Normocephalic and atraumatic.  Mouth/Throat:  Oropharynx is clear and moist. No oropharyngeal exudate.  Eyes: Conjunctivae are normal. Right eye exhibits no discharge. Left eye exhibits no discharge. No scleral icterus.  Neck: Normal range of motion. Neck supple. No JVD present. No tracheal deviation present. No thyromegaly present.  Cardiovascular: Normal rate, regular rhythm, normal heart sounds and intact distal pulses.  Exam reveals no gallop and no friction rub.   No murmur heard. Pulmonary/Chest: Effort normal and breath sounds normal. No stridor. No respiratory distress. He has no wheezes. He has no rales. He exhibits no tenderness.  Abdominal: Soft. Bowel sounds are normal. He exhibits no distension and no mass. There is no tenderness. There is no rebound and no guarding.  Musculoskeletal: Normal range of motion. He exhibits no edema or tenderness.  Lymphadenopathy:    He has no cervical adenopathy.  Neurological: He is alert and oriented to person, place, and time. He has normal reflexes. He displays normal reflexes. No cranial nerve deficit. He exhibits normal muscle tone. Coordination normal.  Skin: Skin is warm and dry. No rash noted. He is not diaphoretic. No erythema. No pallor.  Vitals reviewed.   Lab Results  Component Value Date   WBC 8.5 02/01/2015   HGB 14.5 02/01/2015   HCT 44.5 02/01/2015   PLT 201.0 02/01/2015   GLUCOSE 113* 02/01/2015   CHOL 148 02/01/2015   TRIG 112.0 02/01/2015   HDL 42.40 02/01/2015   LDLCALC 84 02/01/2015   ALT 55* 02/01/2015   AST 53* 02/01/2015   NA 139 02/01/2015   K 4.9 02/01/2015   CL 97 02/01/2015   CREATININE 0.95 02/01/2015   BUN 22 02/01/2015   CO2 33* 02/01/2015   TSH 2.55 02/01/2015   HGBA1C 6.5 02/01/2015   MICROALBUR 0.8 02/01/2015    Dg Foot Complete Right  05/17/2012  *RADIOLOGY REPORT* Clinical Data: Right-sided foot pain. RIGHT FOOT COMPLETE - 3+ VIEW Comparison: No priors. Findings: Three views of the right foot demonstrate no acute displaced fracture,  subluxation or dislocation.  Soft tissues are unremarkable.  Dorsal calcaneal spur incidentally noted. Joint space narrowing, subchondral sclerosis and osteophyte formation are noted throughout the midfoot, and to a lesser extent at the first MTP joint, compatible with osteoarthritis. IMPRESSION: 1.  No acute radiographic abnormality of the right foot. 2.  Degenerative changes of osteoarthritis, as above. Original Report Authenticated By: Vinnie Langton, M.D.    Assessment & Plan:   Braylee was seen today for hypertension and diabetes.  Diagnoses and all orders for this visit:  Diabetes mellitus type 2 with neurological manifestations ([place])- his A1c is down to 6.5%, his lightheadedness may be related to low normal blood sugar so will decrease his metformin dose 2000 mg a day. -     metFORMIN (GLUCOPHAGE) 500 MG tablet; Take 1 tablet (500 mg total) by mouth 2 (two) times [name].  Lightheadedness- this is improving and may be related to his blood sugar control, will decrease the metformin dose as noted above. It does not appear  to be related to his systolic blood pressure.  Essential hypertension, benign- his blood pressures well controlled   I have changed Mr. Fear's metFORMIN. I am also having him maintain his fish oil-omega-3 fatty acids, aspirin EC, multivitamin, metoprolol tartrate, esomeprazole, Fluticasone-Salmeterol, atorvastatin, Ciclopirox, zoster vaccine live (PF), irbesartan-hydrochlorothiazide, nitroGLYCERIN, freestyle, and glucose blood.  Meds ordered this encounter  Medications  . metFORMIN (GLUCOPHAGE) 500 MG tablet    Sig: Take 1 tablet (500 mg total) by mouth 2 (two) times [name].    Dispense:  180 tablet    Refill:  1     Follow-up: Return in about 3 months (around 06/25/2015).  Scarlette Calico, MD

## 2015-03-28 NOTE — Patient Instructions (Signed)

## 2015-04-06 ENCOUNTER — Other Ambulatory Visit: Payer: Self-pay | Admitting: Internal Medicine

## 2015-04-09 NOTE — Telephone Encounter (Signed)
Left msg on triage stating need blood glucose strips. MD want [place] to check blood sugars 6 x's a day. Pharmacy is needing updated script...Johny Chess

## 2015-04-13 NOTE — Telephone Encounter (Signed)
Pt daughter called and said that pharmacy needs a new script to increase before and after meal which will be 6 times a day and as of right now it is only at 2 times a day

## 2015-04-13 NOTE — Telephone Encounter (Signed)
LMOVM regarding strips. Pt is not on isnulin not sure if ins will cover more than BID and also calling to go over VM that was left yesterday

## 2015-04-15 DIAGNOSIS — R69 Illness, unspecified: Secondary | ICD-10-CM | POA: Diagnosis not present

## 2015-04-24 ENCOUNTER — Other Ambulatory Visit: Payer: Self-pay | Admitting: Internal Medicine

## 2015-04-30 ENCOUNTER — Ambulatory Visit (INDEPENDENT_AMBULATORY_CARE_PROVIDER_SITE_OTHER): Payer: Medicare HMO | Admitting: Cardiology

## 2015-04-30 ENCOUNTER — Encounter: Payer: Self-pay | Admitting: Cardiology

## 2015-04-30 VITALS — BP 128/68 | HR 72 | Ht 70.0 in | Wt 190.1 lb

## 2015-04-30 DIAGNOSIS — E785 Hyperlipidemia, unspecified: Secondary | ICD-10-CM

## 2015-04-30 DIAGNOSIS — I25119 Atherosclerotic heart disease of native coronary artery with unspecified angina pectoris: Secondary | ICD-10-CM

## 2015-04-30 NOTE — Patient Instructions (Signed)
Your physician recommends that you continue on your current medications as directed. Please refer to the Current Medication list given to you today. Your physician wants you to follow-up in: [name].  You will receive a reminder letter in the mail two months in advance. If you don't receive a letter, please call our office to schedule the follow-up appointment.

## 2015-04-30 NOTE — Progress Notes (Signed)
Patient ID: Manuel [name], male   DOB: 10/17/1928, 82 y.o.   MRN: [id number] PCP: Dr. Ronnald Ramp  82 yo with history of CAD s/p MI in 1/11, HTN, DM, allergic asthma, and hyperlipidemia presents for cardiology followup.  Patient developed bilateral shoulder pain in 1/11 and went to the ER, where he was found to have NSTEMI.  LHC showed 99% mid CFX stenosis treated with DES.  EF was preserved.  Last Cardiolite in 2/15 showed no evidence for ischemia or infarction.   Since I last saw him, Manuel [name] has been doing well.  He plays golf about once a week and walks for 1.5 miles 5 times a week.  No significant exertional dyspnea.  Feels a "twinge" of mild chest pain about once every 3 months when walking up a steep hill.  He will take NTG.  This is a stable pattern x years now.   Labs (7/12): LDL 71, HDL 50, K 5, creatinine 0.8 Labs (1/13): LDL 65, HDL 47 Labs (4/13): K 4.6, creatinine 0.8 Labs (1/16): LDL 84, HDl 47, K 5.5, creatinine 1.02 Labs (12/16): LDL 84, HDl 42, K 4.9, creatinine 0.95, TSH normal, HCT 44.5  PMH: 1. Asthma: Allergic component 2. CAD: s/p MI in 1/11.  LHC (1/11) with 99% mCFX treated with 2.5 x 16 Xience V DES; 50% mLAD; EF 55%.  ETT-myoview (10/12): 4'51", no significant ST segment changes, EF 56%, no ischemia or infarction. ETT-Cardiolite (2/15): 4'15", probably normal with no ischemia/infarction, EF 68%.  3. GERD 4. Appendectomy 5. HTN 6. Hyperlipidemia 7. Diabetes mellitus type II 8. Lightheaded spells: Holter (10/12): Frequent PACs and PVCs.  No significant bradycardia.  Probably due primarily to BPPV.   FH: Parents lived into their 27s.  No premature CAD.    SH: Moved from [place], [place] to [place].  Married, originally from [place].  Now living in daughter's house.  Quit smoking 1990, retired Hotel manager, occasional smoker.     Current Outpatient Prescriptions  Medication Sig Dispense Refill  . aspirin EC 81 MG EC tablet Take 1 tablet (81 mg total) by mouth daily.    [name]  atorvastatin (LIPITOR) 40 MG tablet Take 1 tablet (40 mg total) by mouth daily. 90 tablet 1  . Ciclopirox 0.77 % gel Apply 1 Act topically 2 (two) times daily. 100 g 3  . esomeprazole (NEXIUM) 40 MG capsule TAKE ONE CAPSULE BY MOUTH EVERY DAY 90 capsule 3  . fish oil-omega-3 fatty acids 1000 MG capsule Take 1 g by mouth daily.     . Fluticasone-Salmeterol (ADVAIR DISKUS) 250-50 MCG/DOSE AEPB Inhale 1 puff into the lungs 2 (two) times daily. 1 each 3  . glucose blood (FREESTYLE LITE) test strip Use to check blood sugars 6 times a day Dx E11.9 (Patient taking differently: Use to check blood sugars 2 times a day Dx E11.9) 200 each 1  . irbesartan-hydrochlorothiazide (AVALIDE) 300-12.5 MG tablet Take 0.5 tablets by mouth daily.    . Lancets (FREESTYLE) lancets 1 each by Other route 2 (two) times daily. Use to help check blood sugars twice a day Dx E11.9 100 each 3  . metFORMIN (GLUCOPHAGE) 500 MG tablet Take 500 mg by mouth 2 (two) times daily with a meal.    . metoprolol tartrate (LOPRESSOR) 25 MG tablet TAKE 1/2 TABLET BY MOUTH TWICE A DAY 90 tablet 3  . Multiple Vitamin (MULTIVITAMIN) tablet Take 1 tablet by mouth daily.    . nitroGLYCERIN (NITROSTAT) 0.4 MG SL tablet Place 0.4 mg  under the tongue every 5 (five) minutes as needed for chest pain (x 3 doses).    . zoster vaccine live, PF, (ZOSTAVAX) 07619 UNT/0.65ML injection Inject 19,400 Units into the skin once. 1 each 0   No current facility-administered medications for this visit.    BP 128/68 mmHg  Pulse 72  Ht 5' 10"  (1.778 m)  Wt 190 lb 2 oz (86.24 kg)  BMI 27.28 kg/m2  SpO2 95% General: NAD Neck: No JVD, no thyromegaly or thyroid nodule.  Lungs: Clear to auscultation bilaterally with normal respiratory effort. CV: Nondisplaced PMI.  Heart regular S1/S2, no S3/S4, no murmur.  Trace ankle edema.  No carotid bruit.  Normal pedal pulses.  Abdomen: Soft, nontender, no hepatosplenomegaly, no distention.  Neurologic: Alert and oriented  x 3.  Psych: Normal affect. Extremities: No clubbing or cyanosis.   Assessment/Plan: CORONARY HEART DISEASE  Cardiolite 2/15 with no evidence for ischemia or infarction. He has rare exertional chest pain with heavy activity.  This has been a chronic stable pattern.  Continue ASA 81, statin, metoprolol, and ARB.  Continue daily exercise.  Hyperlipidemia Good lipids recently, continue statin.   Followup in 1 year.   Manuel [name] 04/30/2015

## 2015-05-01 DIAGNOSIS — Z85828 Personal history of other malignant neoplasm of skin: Secondary | ICD-10-CM | POA: Diagnosis not present

## 2015-05-01 DIAGNOSIS — Z23 Encounter for immunization: Secondary | ICD-10-CM | POA: Diagnosis not present

## 2015-05-01 DIAGNOSIS — D1801 Hemangioma of skin and subcutaneous tissue: Secondary | ICD-10-CM | POA: Diagnosis not present

## 2015-05-01 DIAGNOSIS — B351 Tinea unguium: Secondary | ICD-10-CM | POA: Diagnosis not present

## 2015-05-01 DIAGNOSIS — L719 Rosacea, unspecified: Secondary | ICD-10-CM | POA: Diagnosis not present

## 2015-05-01 DIAGNOSIS — L570 Actinic keratosis: Secondary | ICD-10-CM | POA: Diagnosis not present

## 2015-05-01 DIAGNOSIS — L821 Other seborrheic keratosis: Secondary | ICD-10-CM | POA: Diagnosis not present

## 2015-05-26 DIAGNOSIS — R69 Illness, unspecified: Secondary | ICD-10-CM | POA: Diagnosis not present

## 2015-06-07 ENCOUNTER — Encounter: Payer: Self-pay | Admitting: Internal Medicine

## 2015-06-11 ENCOUNTER — Other Ambulatory Visit: Payer: Self-pay | Admitting: Internal Medicine

## 2015-06-27 ENCOUNTER — Other Ambulatory Visit: Payer: Self-pay | Admitting: *Deleted

## 2015-06-27 ENCOUNTER — Telehealth: Payer: Self-pay | Admitting: Cardiology

## 2015-06-27 MED ORDER — NITROGLYCERIN 0.4 MG SL SUBL: 0.4000 mg | SUBLINGUAL_TABLET | SUBLINGUAL | Status: DC | PRN

## 2015-06-27 NOTE — Telephone Encounter (Signed)
°*  STAT* If patient is at the pharmacy, call can be transferred to refill team.   1. Which medications need to be refilled? (please list name of each medication and dose if known) Nitrostat 0.58m  2. Which pharmacy/location (including street and city if local pharmacy) is medication to [facility] sent to?[facility] [address]  3. Do they need a 30 day or 90 day supply? 30.

## 2015-07-02 ENCOUNTER — Other Ambulatory Visit: Payer: Self-pay | Admitting: Internal Medicine

## 2015-07-13 ENCOUNTER — Other Ambulatory Visit: Payer: Self-pay | Admitting: Cardiology

## 2015-07-20 ENCOUNTER — Telehealth: Payer: Self-pay

## 2015-07-20 MED ORDER — FLUTICASONE-SALMETEROL 250-50 MCG/DOSE IN AEPB: 1.0000 | INHALATION_SPRAY | Freq: Two times a day (BID) | RESPIRATORY_TRACT | Status: DC

## 2015-07-20 NOTE — Telephone Encounter (Signed)
Elizabeth (pt dtr) Lm on triage for rx rf of advair.   erx sent for pt to [facility].   Pt dtr informed

## 2015-07-28 ENCOUNTER — Telehealth: Payer: Self-pay

## 2015-07-28 NOTE — Telephone Encounter (Signed)
Patient is on the list for [employer] 2017 and may be a good candidate for an AWV in 2017. Please let me know if/when appt is scheduled.   

## 2015-08-14 DIAGNOSIS — R69 Illness, unspecified: Secondary | ICD-10-CM | POA: Diagnosis not present

## 2015-10-17 DIAGNOSIS — R69 Illness, unspecified: Secondary | ICD-10-CM | POA: Diagnosis not present

## 2015-10-30 DIAGNOSIS — L719 Rosacea, unspecified: Secondary | ICD-10-CM | POA: Diagnosis not present

## 2015-10-30 DIAGNOSIS — L570 Actinic keratosis: Secondary | ICD-10-CM | POA: Diagnosis not present

## 2015-11-06 DIAGNOSIS — M9903 Segmental and somatic dysfunction of lumbar region: Secondary | ICD-10-CM | POA: Diagnosis not present

## 2015-11-06 DIAGNOSIS — M5136 Other intervertebral disc degeneration, lumbar region: Secondary | ICD-10-CM | POA: Diagnosis not present

## 2015-11-28 ENCOUNTER — Other Ambulatory Visit: Payer: Self-pay | Admitting: Internal Medicine

## 2015-11-29 DIAGNOSIS — R69 Illness, unspecified: Secondary | ICD-10-CM | POA: Diagnosis not present

## 2015-12-12 ENCOUNTER — Ambulatory Visit (INDEPENDENT_AMBULATORY_CARE_PROVIDER_SITE_OTHER): Payer: Medicare HMO | Admitting: Internal Medicine

## 2015-12-12 ENCOUNTER — Other Ambulatory Visit (INDEPENDENT_AMBULATORY_CARE_PROVIDER_SITE_OTHER): Payer: Medicare HMO

## 2015-12-12 ENCOUNTER — Encounter: Payer: Self-pay | Admitting: Internal Medicine

## 2015-12-12 VITALS — BP 140/78 | HR 68 | Temp 98.2°F | Ht 70.0 in | Wt 191.0 lb

## 2015-12-12 DIAGNOSIS — E1149 Type 2 diabetes mellitus with other diabetic neurological complication: Secondary | ICD-10-CM

## 2015-12-12 DIAGNOSIS — I10 Essential (primary) hypertension: Secondary | ICD-10-CM | POA: Diagnosis not present

## 2015-12-12 DIAGNOSIS — I2510 Atherosclerotic heart disease of native coronary artery without angina pectoris: Secondary | ICD-10-CM

## 2015-12-12 DIAGNOSIS — E785 Hyperlipidemia, unspecified: Secondary | ICD-10-CM

## 2015-12-12 LAB — COMPREHENSIVE METABOLIC PANEL
ALBUMIN: 4.1 g/dL (ref 3.5–5.2)
ALK PHOS: 110 U/L (ref 39–117)
ALT: 69 U/L — ABNORMAL HIGH (ref 0–53)
AST: 62 U/L — ABNORMAL HIGH (ref 0–37)
BUN: 20 mg/dL (ref 6–23)
CALCIUM: 9.4 mg/dL (ref 8.4–10.5)
CHLORIDE: 97 meq/L (ref 96–112)
CO2: 35 mEq/L — ABNORMAL HIGH (ref 19–32)
Creatinine, Ser: 0.93 mg/dL (ref 0.40–1.50)
GFR: 81.95 mL/min (ref >60.00)
Glucose, Bld: 112 mg/dL — ABNORMAL HIGH (ref 70–99)
POTASSIUM: 4 meq/L (ref 3.5–5.1)
Sodium: 140 mEq/L (ref 135–145)
Total Bilirubin: 0.6 mg/dL (ref 0.2–1.2)
Total Protein: 7.5 g/dL (ref 6.0–8.3)

## 2015-12-12 LAB — LIPID PANEL
CHOLESTEROL: 161 mg/dL (ref 0–200)
HDL: 47.8 mg/dL (ref >39.00)
LDL CALC: 88 mg/dL (ref 0–99)
NonHDL: 113.11
TRIGLYCERIDES: 126 mg/dL (ref 0.0–149.0)
Total CHOL/HDL Ratio: 3
VLDL: 25.2 mg/dL (ref 0.0–40.0)

## 2015-12-12 LAB — URINALYSIS, ROUTINE W REFLEX MICROSCOPIC
BILIRUBIN URINE: NEGATIVE
HGB URINE DIPSTICK: NEGATIVE
KETONES UR: NEGATIVE
LEUKOCYTES UA: NEGATIVE
NITRITE: NEGATIVE
PH: 7 (ref 5.0–8.0)
Specific Gravity, Urine: 1.015 (ref 1.000–1.030)
Total Protein, Urine: NEGATIVE
UROBILINOGEN UA: 2 — AB (ref 0.0–1.0)
Urine Glucose: NEGATIVE

## 2015-12-12 LAB — CBC WITH DIFFERENTIAL/PLATELET
BASOS PCT: 1.1 % (ref 0.0–3.0)
Basophils Absolute: 0.1 10*3/uL (ref 0.0–0.1)
EOS PCT: 7.4 % — AB (ref 0.0–5.0)
Eosinophils Absolute: 0.4 10*3/uL (ref 0.0–0.7)
HEMATOCRIT: 42.5 % (ref 39.0–52.0)
HEMOGLOBIN: 14.2 g/dL (ref 13.0–17.0)
LYMPHS PCT: 28.1 % (ref 12.0–46.0)
Lymphs Abs: 1.5 10*3/uL (ref 0.7–4.0)
MCHC: 33.5 g/dL (ref 30.0–36.0)
MCV: 94 fl (ref 78.0–100.0)
Monocytes Absolute: 0.4 10*3/uL (ref 0.1–1.0)
Monocytes Relative: 8.3 % (ref 3.0–12.0)
Neutro Abs: 2.9 10*3/uL (ref 1.4–7.7)
Neutrophils Relative %: 55.1 % (ref 43.0–77.0)
Platelets: 200 10*3/uL (ref 150.0–400.0)
RBC: 4.52 Mil/uL (ref 4.22–5.81)
RDW: 14.3 % (ref 11.5–15.5)
WBC: 5.3 10*3/uL (ref 4.0–10.5)

## 2015-12-12 LAB — HEMOGLOBIN A1C: Hgb A1c MFr Bld: 6.5 % (ref 4.6–6.5)

## 2015-12-12 LAB — MICROALBUMIN / CREATININE URINE RATIO
CREATININE, U: 80.6 mg/dL
MICROALB UR: 0.8 mg/dL (ref 0.0–1.9)
MICROALB/CREAT RATIO: 1 mg/g (ref 0.0–30.0)

## 2015-12-12 LAB — TSH: TSH: 2.18 u[IU]/mL (ref 0.35–4.50)

## 2015-12-12 NOTE — Progress Notes (Signed)
Subjective:  Patient ID: Manuel [name], male    DOB: 01/10/1929  Age: 82 y.o. MRN: [id number]  CC: Hypertension; Hyperlipidemia; and Diabetes   HPI Manuel [name] presents for follow-up on the above medical problems. He feels well today and offers no complaints. He has had no recent episodes of chest pain, shortness of breath, palpitations, edema, or fatigue. He tells me his blood pressure and his blood sugar have been well controlled. He is tolerating his cholesterol medicine well with no muscle or joint aches.  Outpatient Medications Prior to Visit  Medication Sig Dispense Refill  . aspirin EC 81 MG EC tablet Take 1 tablet (81 mg total) by mouth daily.    [name] atorvastatin (LIPITOR) 40 MG tablet TAKE 1 TABLET (40 MG TOTAL) BY MOUTH DAILY. 90 tablet 1  . Ciclopirox 0.77 % gel Apply 1 Act topically 2 (two) times daily. 100 g 3  . esomeprazole (NEXIUM) 40 MG capsule TAKE ONE CAPSULE BY MOUTH EVERY DAY 90 capsule 3  . glucose blood (FREESTYLE LITE) test strip Use to check blood sugars 6 times a day Dx E11.9 (Patient taking differently: Use to check blood sugars 2 times a day Dx E11.9) 200 each 1  . irbesartan-hydrochlorothiazide (AVALIDE) 300-12.5 MG tablet Take 0.5 tablets by mouth daily.    . irbesartan-hydrochlorothiazide (AVALIDE) 300-12.5 MG tablet TAKE 1 TABLET BY MOUTH DAILY. 90 tablet 0  . Lancets (FREESTYLE) lancets 1 each by Other route 2 (two) times daily. Use to help check blood sugars twice a day Dx E11.9 100 each 3  . metFORMIN (GLUCOPHAGE) 500 MG tablet Take 500 mg by mouth 2 (two) times daily with a meal.    . metoprolol tartrate (LOPRESSOR) 25 MG tablet TAKE 1/2 TABLET BY MOUTH TWICE A DAY 90 tablet 2  . Multiple Vitamin (MULTIVITAMIN) tablet Take 1 tablet by mouth daily.    . nitroGLYCERIN (NITROSTAT) 0.4 MG SL tablet Place 1 tablet (0.4 mg total) under the tongue every 5 (five) minutes as needed for chest pain (x 3 doses). 25 tablet 5  . fish oil-omega-3 fatty acids 1000 MG  capsule Take 1 g by mouth daily.     . Fluticasone-Salmeterol (ADVAIR DISKUS) 250-50 MCG/DOSE AEPB Inhale 1 puff into the lungs 2 (two) times daily. 3 each 3  . zoster vaccine live, PF, (ZOSTAVAX) 40981 UNT/0.65ML injection Inject 19,400 Units into the skin once. 1 each 0   No facility-administered medications prior to visit.     ROS Review of Systems  Constitutional: Negative.  Negative for activity change, appetite change, diaphoresis, fatigue and fever.  HENT: Negative.   Eyes: Negative for visual disturbance.  Respiratory: Negative.  Negative for cough, choking, chest tightness, shortness of breath and stridor.   Cardiovascular: Negative.  Negative for chest pain, palpitations and leg swelling.  Gastrointestinal: Negative.  Negative for abdominal pain, constipation, diarrhea, nausea and vomiting.  Endocrine: Negative.  Negative for polydipsia, polyphagia and polyuria.  Genitourinary: Negative.  Negative for difficulty urinating.  Musculoskeletal: Negative.  Negative for arthralgias, back pain, myalgias and neck pain.  Skin: Negative.  Negative for color change and rash.  Allergic/Immunologic: Negative.   Neurological: Negative.  Negative for dizziness, tremors, weakness, light-headedness, numbness and headaches.  Hematological: Negative.  Negative for adenopathy. Does not bruise/bleed easily.  Psychiatric/Behavioral: Negative.     Objective:  BP 140/78 (BP Location: Left Arm, Patient Position: Sitting, Cuff Size: Normal)   Pulse 68   Temp 98.2 F (36.8 C) (Oral)  Ht 5' 10"  (1.778 m)   Wt 191 lb (86.6 kg)   SpO2 91%   BMI 27.41 kg/m   BP Readings from Last 3 Encounters:  12/12/15 140/78  04/30/15 128/68  03/28/15 112/60    Wt Readings from Last 3 Encounters:  12/12/15 191 lb (86.6 kg)  04/30/15 190 lb 2 oz (86.2 kg)  03/28/15 188 lb (85.3 kg)    Physical Exam  Constitutional: He is oriented to person, place, and time. No distress.  HENT:  Mouth/Throat:  Oropharynx is clear and moist. No oropharyngeal exudate.  Eyes: Conjunctivae are normal. Right eye exhibits no discharge. Left eye exhibits no discharge. No scleral icterus.  Neck: Normal range of motion. Neck supple. No JVD present. No tracheal deviation present. No thyromegaly present.  Cardiovascular: Normal rate, regular rhythm, normal heart sounds and intact distal pulses.  Exam reveals no gallop and no friction rub.   No murmur heard. Pulmonary/Chest: Effort normal and breath sounds normal. No stridor. No respiratory distress. He has no wheezes. He has no rales. He exhibits no tenderness.  Abdominal: Soft. Bowel sounds are normal. He exhibits no distension and no mass. There is no tenderness. There is no rebound and no guarding.  Musculoskeletal: Normal range of motion. He exhibits no edema, tenderness or deformity.  Lymphadenopathy:    He has no cervical adenopathy.  Neurological: He is oriented to person, place, and time.  Skin: Skin is warm and dry. No rash noted. He is not diaphoretic. No erythema. No pallor.  Psychiatric: He has a normal mood and affect. His behavior is normal. Judgment and thought content normal.  Vitals reviewed.   Lab Results  Component Value Date   WBC 5.3 12/12/2015   HGB 14.2 12/12/2015   HCT 42.5 12/12/2015   PLT 200.0 12/12/2015   GLUCOSE 112 (H) 12/12/2015   CHOL 161 12/12/2015   TRIG 126.0 12/12/2015   HDL 47.80 12/12/2015   LDLCALC 88 12/12/2015   ALT 69 (H) 12/12/2015   AST 62 (H) 12/12/2015   NA 140 12/12/2015   K 4.0 12/12/2015   CL 97 12/12/2015   CREATININE 0.93 12/12/2015   BUN 20 12/12/2015   CO2 35 (H) 12/12/2015   TSH 2.18 12/12/2015   HGBA1C 6.5 12/12/2015   MICROALBUR 0.8 12/12/2015    Dg Foot Complete Right  Result Date: 05/17/2012 *RADIOLOGY REPORT* Clinical Data: Right-sided foot pain. RIGHT FOOT COMPLETE - 3+ VIEW Comparison: No priors. Findings: Three views of the right foot demonstrate no acute displaced fracture,  subluxation or dislocation.  Soft tissues are unremarkable.  Dorsal calcaneal spur incidentally noted. Joint space narrowing, subchondral sclerosis and osteophyte formation are noted throughout the midfoot, and to a lesser extent at the first MTP joint, compatible with osteoarthritis. IMPRESSION: 1.  No acute radiographic abnormality of the right foot. 2.  Degenerative changes of osteoarthritis, as above. Original Report Authenticated By: Vinnie Langton, M.D.    Assessment & Plan:   [place] was seen today for hypertension, hyperlipidemia and diabetes.  Diagnoses and all orders for this visit:  Atherosclerosis of native coronary artery of native heart without angina pectoris- he has had no recent episodes of angina, will continue to mitigate his risk factors with statin therapy, blood pressure control, and blood sugar control. -     Lipid panel; Future  Essential hypertension, benign- His blood pressure is well-controlled, electrolytes and renal function are stable. -     Comprehensive metabolic panel; Future -     CBC with  Differential/Platelet; Future -     Urinalysis, Routine w reflex microscopic (not at [facility]); Future  Diabetes mellitus type 2 with neurological manifestations ([place])- his A1c remains at 6.5%, his blood sugars are adequately well-controlled. -     Comprehensive metabolic panel; Future -     Hemoglobin A1c; Future -     Microalbumin / creatinine urine ratio; Future  Hyperlipidemia with target LDL less than 100- he is achieved his LDL goal is doing well on the statin. -     Lipid panel; Future -     TSH; Future   I have discontinued Mr. Ledesma fish oil-omega-3 fatty acids, zoster vaccine live (PF), and Fluticasone-Salmeterol. I am also having him maintain his aspirin EC, multivitamin, Ciclopirox, irbesartan-hydrochlorothiazide, freestyle, glucose blood, metFORMIN, esomeprazole, nitroGLYCERIN, atorvastatin, metoprolol tartrate, and irbesartan-hydrochlorothiazide.  No orders of  the defined types were placed in this encounter.    Follow-up: Return in about 6 months (around 06/11/2016).  Scarlette Calico, MD

## 2015-12-12 NOTE — Progress Notes (Signed)
Pre visit review using our clinic review tool, if applicable. No additional management support is needed unless otherwise documented below in the visit note. 

## 2015-12-12 NOTE — Patient Instructions (Signed)

## 2015-12-31 ENCOUNTER — Other Ambulatory Visit: Payer: Self-pay | Admitting: Internal Medicine

## 2016-01-30 LAB — HM DIABETES EYE EXAM

## 2016-01-31 DIAGNOSIS — H1045 Other chronic allergic conjunctivitis: Secondary | ICD-10-CM | POA: Diagnosis not present

## 2016-01-31 DIAGNOSIS — H5203 Hypermetropia, bilateral: Secondary | ICD-10-CM | POA: Diagnosis not present

## 2016-01-31 DIAGNOSIS — H52223 Regular astigmatism, bilateral: Secondary | ICD-10-CM | POA: Diagnosis not present

## 2016-01-31 DIAGNOSIS — E119 Type 2 diabetes mellitus without complications: Secondary | ICD-10-CM | POA: Diagnosis not present

## 2016-02-13 ENCOUNTER — Other Ambulatory Visit: Payer: Self-pay | Admitting: *Deleted

## 2016-02-13 DIAGNOSIS — R69 Illness, unspecified: Secondary | ICD-10-CM | POA: Diagnosis not present

## 2016-02-13 MED ORDER — ONETOUCH DELICA LANCETS 33G MISC: 3 refills | Status: DC

## 2016-02-13 MED ORDER — GLUCOSE BLOOD VI STRP: 1.0000 | ORAL_STRIP | Freq: Two times a day (BID) | 3 refills | Status: DC

## 2016-02-13 MED ORDER — ONETOUCH VERIO FLEX SYSTEM W/DEVICE KIT: 1.0000 | PACK | Freq: Every day | 0 refills | Status: DC

## 2016-02-13 NOTE — Telephone Encounter (Signed)
Daughter left msg on triage insurance is requiring dad to use One touch verio. [facility]. Called daughter back no answer LMOM rx has been sent...Johny Chess

## 2016-03-08 ENCOUNTER — Other Ambulatory Visit: Payer: Self-pay | Admitting: Internal Medicine

## 2016-03-13 DIAGNOSIS — L03116 Cellulitis of left lower limb: Secondary | ICD-10-CM | POA: Diagnosis not present

## 2016-03-27 ENCOUNTER — Ambulatory Visit (INDEPENDENT_AMBULATORY_CARE_PROVIDER_SITE_OTHER): Payer: Medicare HMO | Admitting: Nurse Practitioner

## 2016-03-27 ENCOUNTER — Encounter: Payer: Self-pay | Admitting: Nurse Practitioner

## 2016-03-27 VITALS — BP 118/66 | HR 70 | Temp 98.0°F | Ht 67.0 in | Wt 183.0 lb

## 2016-03-27 DIAGNOSIS — S8012XA Contusion of left lower leg, initial encounter: Secondary | ICD-10-CM

## 2016-03-27 NOTE — Patient Instructions (Addendum)
Use compression stocking during the day and off ant night.  Hematoma A hematoma is a collection of blood. The collection of blood can turn into a hard, painful lump under the skin. Your skin may turn blue or yellow if the hematoma is close to the surface of the skin. Most hematomas get better in a few days to weeks. Some hematomas are serious and need medical care. Hematomas can be very small or very big. Follow these instructions at home:  Apply ice to the injured area:  Put ice in a plastic bag.  Place a towel between your skin and the bag.  Leave the ice on for 20 minutes, 2-3 times a day for the first 1 to 2 days.  After the first 2 days, switch to using warm packs on the injured area.  Raise (elevate) the injured area to lessen pain and puffiness (swelling). You may also wrap the area with an elastic bandage. Make sure the bandage is not wrapped too tight.  If you have a painful hematoma on your leg or foot, you may use crutches for a couple days.  Only take medicines as told by your doctor. Get help right away if:  Your pain gets worse.  Your pain is not controlled with medicine.  You have a fever.  Your puffiness gets worse.  Your skin turns more blue or yellow.  Your skin over the hematoma breaks or starts bleeding.  Your hematoma is in your chest or belly (abdomen) and you are short of breath, feel weak, or have a change in consciousness.  Your hematoma is on your scalp and you have a headache that gets worse or a change in alertness or consciousness. This information is not intended to replace advice given to you by your health care provider. Make sure you discuss any questions you have with your health care provider. Document Released: 03/20/2004 Document Revised: 07/19/2015 Document Reviewed: 07/21/2012 Elsevier Interactive Patient Education  2017 [employer].

## 2016-03-27 NOTE — Progress Notes (Signed)
Pre visit review using our clinic review tool, if applicable. No additional management support is needed unless otherwise documented below in the visit note. 

## 2016-03-27 NOTE — Progress Notes (Signed)
Subjective:  Patient ID: Manuel [name], male    DOB: 1928-09-05  Age: 83 y.o. MRN: [id number]  CC: Leg Swelling (spot on lower left leg swelling,fluid,discoloration. an abx for this 10 days ago, this is going for 2 wks. )   HPI Manuel [name] is in office today with daughter. He present to day with left LE swelling x 2.5weeks. He was evaluated and treated for cellulitis by [facility] 2weeks ago. Unable to remember name of oral abx. Took medication for 10days.  Reports resolution of redness, but has persistent swelling. He denies any pain or fever or claudication.  Outpatient Medications Prior to Visit  Medication Sig Dispense Refill  . aspirin EC 81 MG EC tablet Take 1 tablet (81 mg total) by mouth daily.    [name] atorvastatin (LIPITOR) 40 MG tablet TAKE 1 TABLET (40 MG TOTAL) BY MOUTH DAILY. 90 tablet 3  . Blood Glucose Monitoring Suppl (ONETOUCH VERIO FLEX SYSTEM) w/Device KIT 1 Device by Does not apply route daily. Use to check blood sugars daily Dx E11.9 1 kit 0  . Ciclopirox 0.77 % gel Apply 1 Act topically 2 (two) times daily. 100 g 3  . esomeprazole (NEXIUM) 40 MG capsule TAKE ONE CAPSULE BY MOUTH EVERY DAY 90 capsule 3  . glucose blood (ONETOUCH VERIO) test strip 1 each by Other route 2 (two) times daily. Use to check blood sugars twice a day Dx E11.9 100 each 3  . irbesartan-hydrochlorothiazide (AVALIDE) 300-12.5 MG tablet Take 1.5 tablets by mouth daily.    . metFORMIN (GLUCOPHAGE) 500 MG tablet Take 500 mg by mouth 2 (two) times daily with a meal.    . metoprolol tartrate (LOPRESSOR) 25 MG tablet TAKE 1/2 TABLET BY MOUTH TWICE A DAY 90 tablet 2  . Multiple Vitamin (MULTIVITAMIN) tablet Take 1 tablet by mouth daily.    . nitroGLYCERIN (NITROSTAT) 0.4 MG SL tablet Place 1 tablet (0.4 mg total) under the tongue every 5 (five) minutes as needed for chest pain (x 3 doses). 25 tablet 5  . ONETOUCH DELICA LANCETS 62H MISC Use to help check blood sugars twice a day Dx E11.9 100 each 3  .  glucose blood (FREESTYLE LITE) test strip Use to check blood sugars 6 times a day Dx E11.9 (Patient taking differently: Use to check blood sugars 2 times a day Dx E11.9) 200 each 1  . irbesartan-hydrochlorothiazide (AVALIDE) 300-12.5 MG tablet TAKE 1 TABLET BY MOUTH DAILY. 90 tablet 1  . Lancets (FREESTYLE) lancets 1 each by Other route 2 (two) times daily. Use to help check blood sugars twice a day Dx E11.9 100 each 3   No facility-administered medications prior to visit.     ROS See HPI  Objective:  BP 118/66   Pulse 70   Temp 98 F (36.7 C)   Ht 5' 7" (1.702 m)   Wt 183 lb (83 kg)   SpO2 94%   BMI 28.66 kg/m   BP Readings from Last 3 Encounters:  03/27/16 118/66  12/12/15 140/78  04/30/15 128/68    Wt Readings from Last 3 Encounters:  03/27/16 183 lb (83 kg)  12/12/15 191 lb (86.6 kg)  04/30/15 190 lb 2 oz (86.2 kg)    Physical Exam  Constitutional: He is oriented to person, place, and time.  Cardiovascular: Normal rate.   Pulmonary/Chest: Effort normal.  Musculoskeletal: He exhibits edema. He exhibits no tenderness.       Left ankle: Normal.  Legs:      Left foot: Normal.  Neurological: He is alert and oriented to person, place, and time.  Skin: No rash noted. No erythema.    Lab Results  Component Value Date   WBC 5.3 12/12/2015   HGB 14.2 12/12/2015   HCT 42.5 12/12/2015   PLT 200.0 12/12/2015   GLUCOSE 112 (H) 12/12/2015   CHOL 161 12/12/2015   TRIG 126.0 12/12/2015   HDL 47.80 12/12/2015   LDLCALC 88 12/12/2015   ALT 69 (H) 12/12/2015   AST 62 (H) 12/12/2015   NA 140 12/12/2015   K 4.0 12/12/2015   CL 97 12/12/2015   CREATININE 0.93 12/12/2015   BUN 20 12/12/2015   CO2 35 (H) 12/12/2015   TSH 2.18 12/12/2015   HGBA1C 6.5 12/12/2015   MICROALBUR 0.8 12/12/2015    Dg Foot Complete Right  Result Date: 05/17/2012 *RADIOLOGY REPORT* Clinical Data: Right-sided foot pain. RIGHT FOOT COMPLETE - 3+ VIEW Comparison: No priors. Findings:  Three views of the right foot demonstrate no acute displaced fracture, subluxation or dislocation.  Soft tissues are unremarkable.  Dorsal calcaneal spur incidentally noted. Joint space narrowing, subchondral sclerosis and osteophyte formation are noted throughout the midfoot, and to a lesser extent at the first MTP joint, compatible with osteoarthritis. IMPRESSION: 1.  No acute radiographic abnormality of the right foot. 2.  Degenerative changes of osteoarthritis, as above. Original Report Authenticated By: Daniel Entrikin, M.D.    Assessment & Plan:   Manuel [name] was seen today for leg swelling.  Diagnoses and all orders for this visit:  Hematoma of leg, left, initial encounter   I have discontinued Manuel. [name]'s freestyle. I am also having him maintain his aspirin EC, multivitamin, Ciclopirox, irbesartan-hydrochlorothiazide, metFORMIN, esomeprazole, nitroGLYCERIN, metoprolol tartrate, atorvastatin, ONETOUCH VERIO FLEX SYSTEM, glucose blood, and ONETOUCH DELICA LANCETS 33G.  No orders of the defined types were placed in this encounter.   Follow-up: No Follow-up on file.  Charlotte Nche, NP 

## 2016-04-06 ENCOUNTER — Other Ambulatory Visit: Payer: Self-pay | Admitting: Cardiology

## 2016-04-09 ENCOUNTER — Other Ambulatory Visit: Payer: Self-pay | Admitting: Internal Medicine

## 2016-05-05 ENCOUNTER — Other Ambulatory Visit (INDEPENDENT_AMBULATORY_CARE_PROVIDER_SITE_OTHER): Payer: Medicare HMO

## 2016-05-05 ENCOUNTER — Encounter: Payer: Self-pay | Admitting: Internal Medicine

## 2016-05-05 ENCOUNTER — Ambulatory Visit (INDEPENDENT_AMBULATORY_CARE_PROVIDER_SITE_OTHER): Payer: Medicare HMO | Admitting: Internal Medicine

## 2016-05-05 VITALS — BP 114/70 | HR 78 | Temp 98.2°F | Ht 67.0 in | Wt 186.5 lb

## 2016-05-05 DIAGNOSIS — I10 Essential (primary) hypertension: Secondary | ICD-10-CM

## 2016-05-05 DIAGNOSIS — J4489 Other specified chronic obstructive pulmonary disease: Secondary | ICD-10-CM

## 2016-05-05 DIAGNOSIS — E1149 Type 2 diabetes mellitus with other diabetic neurological complication: Secondary | ICD-10-CM | POA: Diagnosis not present

## 2016-05-05 DIAGNOSIS — J449 Chronic obstructive pulmonary disease, unspecified: Secondary | ICD-10-CM | POA: Insufficient documentation

## 2016-05-05 DIAGNOSIS — J4530 Mild persistent asthma, uncomplicated: Secondary | ICD-10-CM | POA: Diagnosis not present

## 2016-05-05 DIAGNOSIS — R062 Wheezing: Secondary | ICD-10-CM

## 2016-05-05 DIAGNOSIS — Z0000 Encounter for general adult medical examination without abnormal findings: Secondary | ICD-10-CM | POA: Diagnosis not present

## 2016-05-05 LAB — COMPREHENSIVE METABOLIC PANEL
ALBUMIN: 4.1 g/dL (ref 3.5–5.2)
ALT: 61 U/L — ABNORMAL HIGH (ref 0–53)
AST: 54 U/L — ABNORMAL HIGH (ref 0–37)
Alkaline Phosphatase: 106 U/L (ref 39–117)
BUN: 20 mg/dL (ref 6–23)
CALCIUM: 9.9 mg/dL (ref 8.4–10.5)
CHLORIDE: 99 meq/L (ref 96–112)
CO2: 36 mEq/L — ABNORMAL HIGH (ref 19–32)
CREATININE: 0.9 mg/dL (ref 0.40–1.50)
GFR: 85.03 mL/min (ref >60.00)
Glucose, Bld: 126 mg/dL — ABNORMAL HIGH (ref 70–99)
POTASSIUM: 4.8 meq/L (ref 3.5–5.1)
Sodium: 139 mEq/L (ref 135–145)
Total Bilirubin: 0.7 mg/dL (ref 0.2–1.2)
Total Protein: 7.5 g/dL (ref 6.0–8.3)

## 2016-05-05 LAB — POCT EXHALED NITRIC OXIDE: FeNO level (ppb): 33

## 2016-05-05 LAB — HEMOGLOBIN A1C: HEMOGLOBIN A1C: 6.8 % — AB (ref 4.6–6.5)

## 2016-05-05 MED ORDER — FLUTICASONE-UMECLIDIN-VILANT 100-62.5-25 MCG/INH IN AEPB: 1.0000 | INHALATION_SPRAY | Freq: Every day | RESPIRATORY_TRACT | 11 refills | Status: DC

## 2016-05-05 NOTE — Patient Instructions (Signed)
 Health Maintenance, Male A healthy lifestyle and preventive care is important for your health and wellness. Ask your health care provider about what schedule of regular examinations is right for you. What should I know about weight and diet?  Eat a Healthy Diet  Eat plenty of vegetables, fruits, whole grains, low-fat dairy products, and lean protein.  Do not eat a lot of foods high in solid fats, added sugars, or salt. Maintain a Healthy Weight  Regular exercise can help you achieve or maintain a healthy weight. You should:  Do at least 150 minutes of exercise each week. The exercise should increase your heart rate and make you sweat (moderate-intensity exercise).  Do strength-training exercises at least twice a week. Watch Your Levels of Cholesterol and Blood Lipids  Have your blood tested for lipids and cholesterol every 5 years starting at 81 years of age. If you are at high risk for heart disease, you should start having your blood tested when you are 81 years old. You may need to have your cholesterol levels checked more often if:  Your lipid or cholesterol levels are high.  You are older than 81 years of age.  You are at high risk for heart disease. What should I know about cancer screening? Many types of cancers can be detected early and may often be prevented. Lung Cancer  You should be screened every year for lung cancer if:  You are a current smoker who has smoked for at least 30 years.  You are a former smoker who has quit within the past 15 years.  Talk to your health care provider about your screening options, when you should start screening, and how often you should be screened. Colorectal Cancer  Routine colorectal cancer screening usually begins at 81 years of age and should be repeated every 5-10 years until you are 81 years old. You may need to be screened more often if early forms of precancerous polyps or small growths are found. Your health care provider  may recommend screening at an earlier age if you have risk factors for colon cancer.  Your health care provider may recommend using home test kits to check for hidden blood in the stool.  A small camera at the end of a tube can be used to examine your colon (sigmoidoscopy or colonoscopy). This checks for the earliest forms of colorectal cancer. Prostate and Testicular Cancer  Depending on your age and overall health, your health care provider may do certain tests to screen for prostate and testicular cancer.  Talk to your health care provider about any symptoms or concerns you have about testicular or prostate cancer. Skin Cancer  Check your skin from head to toe regularly.  Tell your health care provider about any new moles or changes in moles, especially if:  There is a change in a mole's size, shape, or color.  You have a mole that is larger than a pencil eraser.  Always use sunscreen. Apply sunscreen liberally and repeat throughout the day.  Protect yourself by wearing long sleeves, pants, a wide-brimmed hat, and sunglasses when outside. What should I know about heart disease, diabetes, and high blood pressure?  If you are 18-39 years of age, have your blood pressure checked every 3-5 years. If you are 40 years of age or older, have your blood pressure checked every year. You should have your blood pressure measured twice-once when you are at a hospital or clinic, and once when you are not at   a hospital or clinic. Record the average of the two measurements. To check your blood pressure when you are not at a hospital or clinic, you can use:  An automated blood pressure machine at a pharmacy.  A home blood pressure monitor.  Talk to your health care provider about your target blood pressure.  If you are between 45-79 years old, ask your health care provider if you should take aspirin to prevent heart disease.  Have regular diabetes screenings by checking your fasting blood sugar  level.  If you are at a normal weight and have a low risk for diabetes, have this test once every three years after the age of 45.  If you are overweight and have a high risk for diabetes, consider being tested at a younger age or more often.  A one-time screening for abdominal aortic aneurysm (AAA) by ultrasound is recommended for men aged 65-75 years who are current or former smokers. What should I know about preventing infection? Hepatitis B  If you have a higher risk for hepatitis B, you should be screened for this virus. Talk with your health care provider to find out if you are at risk for hepatitis B infection. Hepatitis C  Blood testing is recommended for:  Everyone born from 1945 through 1965.  Anyone with known risk factors for hepatitis C. Sexually Transmitted Diseases (STDs)  You should be screened each year for STDs including gonorrhea and chlamydia if:  You are sexually active and are younger than 81 years of age.  You are older than 81 years of age and your health care provider tells you that you are at risk for this type of infection.  Your sexual activity has changed since you were last screened and you are at an increased risk for chlamydia or gonorrhea. Ask your health care provider if you are at risk.  Talk with your health care provider about whether you are at high risk of being infected with HIV. Your health care provider may recommend a prescription medicine to help prevent HIV infection. What else can I do?  Schedule regular health, dental, and eye exams.  Stay current with your vaccines (immunizations).  Do not use any tobacco products, such as cigarettes, chewing tobacco, and e-cigarettes. If you need help quitting, ask your health care provider.  Limit alcohol intake to no more than 2 drinks per day. One drink equals 12 ounces of beer, 5 ounces of wine, or 1 ounces of hard liquor.  Do not use street drugs.  Do not share needles.  Ask your health  care provider for help if you need support or information about quitting drugs.  Tell your health care provider if you often feel depressed.  Tell your health care provider if you have ever been abused or do not feel safe at home. This information is not intended to replace advice given to you by your health care provider. Make sure you discuss any questions you have with your health care provider. Document Released: 08/09/2007 Document Revised: 10/10/2015 Document Reviewed: 11/14/2014 Elsevier Interactive Patient Education  2017 [employer]  

## 2016-05-05 NOTE — Progress Notes (Signed)
Subjective:  Patient ID: Manuel [name], male    DOB: 13-Jul-1928  Age: 83 y.o. MRN: [id number]  CC: Annual Exam; Diabetes; Gynecologic Exam; and COPD   HPI Manuel [name] presents for an AWV/CPX.  He complains of an occasional NP cough with SOB and wheezing. He has a hx of asthma, a very remote hx of tobacco abuse and cigar abuse. He is not currently using any inhalers. He has chronic LE edema that has not recently worsened. He denies chest pain, palpitations, DOE, or diaphoresis.  Past Medical History:  Diagnosis Date  . Allergic rhinitis    childhood  . Asthma    Allergic component  . CAD (coronary artery disease)    s/p MI in 1/11. LHC (1/11) with 99% mCFX treated with 2.5 x 16 Xience V DES; 50% mLAD; EF 55%. ETT-myoview (10/12): 4'51", no significant ST segment changes, EF 56%, no ischemia or infarction.  . 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 Chronic kidney disease    stones  . Diabetes mellitus   . Dizziness    Holter (10/12): Frequent PACs and PVCs. No significant bradycardia.   [name] GERD (gastroesophageal reflux disease)   . Hx of cardiovascular stress test    ETT-Myoview (03/2013):  Inf defect on short axis images only (not felt to be significant), EF 68%; low risk.  [name] Hyperlipidemia   . Hypertension   . MI (myocardial infarction) 02/2009  . Pneumonia    age 49   Past Surgical History:  Procedure Laterality Date  . APPENDECTOMY    . CORONARY STENT PLACEMENT      reports that he quit smoking about 49 years ago. His smoking use included Cigarettes. He has never used smokeless tobacco. He reports that he does not drink alcohol or use drugs. family history includes Asthma in his father; Diabetes in his mother; Heart attack in his father; Heart failure in his mother. Allergies  Allergen Reactions  . Other     Cats; dust; environmental    Outpatient Medications Prior to Visit  Medication Sig Dispense Refill  . aspirin EC 81 MG EC tablet Take 1 tablet (81 mg total) by mouth  daily.    [name] atorvastatin (LIPITOR) 40 MG tablet TAKE 1 TABLET (40 MG TOTAL) BY MOUTH DAILY. 90 tablet 3  . Blood Glucose Monitoring Suppl (ONETOUCH VERIO FLEX SYSTEM) w/Device KIT 1 Device by Does not apply route daily. Use to check blood sugars daily Dx E11.9 1 kit 0  . Ciclopirox 0.77 % gel Apply 1 Act topically 2 (two) times daily. 100 g 3  . esomeprazole (NEXIUM) 40 MG capsule TAKE ONE CAPSULE BY MOUTH EVERY DAY 90 capsule 3  . glucose blood (ONETOUCH VERIO) test strip 1 each by Other route 2 (two) times daily. Use to check blood sugars twice a day Dx E11.9 100 each 3  . irbesartan-hydrochlorothiazide (AVALIDE) 300-12.5 MG tablet Take 1.5 tablets by mouth daily.    . metFORMIN (GLUCOPHAGE) 500 MG tablet Take 750 mg by mouth 2 (two) times daily with a meal.     . metoprolol tartrate (LOPRESSOR) 25 MG tablet TAKE 1/2 TABLET BY MOUTH TWICE A DAY 30 tablet 0  . Multiple Vitamin (MULTIVITAMIN) tablet Take 1 tablet by mouth daily.    Glory Rosebush DELICA LANCETS 44R MISC Use to help check blood sugars twice a day Dx E11.9 100 each 3  . nitroGLYCERIN (NITROSTAT) 0.4 MG SL tablet Place 1 tablet (0.4 mg total)  under the tongue every 5 (five) minutes as needed for chest pain (x 3 doses). (Patient not taking: Reported on 05/05/2016) 25 tablet 5  . metFORMIN (GLUCOPHAGE) 500 MG tablet TAKE 2 TABLETS (1,000 MG TOTAL) BY MOUTH 2 (TWO) TIMES DAILY. 360 tablet 0   No facility-administered medications prior to visit.     ROS Review of Systems  Constitutional: Negative for activity change, appetite change, chills, diaphoresis, fatigue, fever and unexpected weight change.  HENT: Negative.   Eyes: Negative for visual disturbance.  Respiratory: Positive for cough, shortness of breath and wheezing. Negative for choking, chest tightness and stridor.   Cardiovascular: Positive for leg swelling. Negative for chest pain and palpitations.  Gastrointestinal: Negative for abdominal pain, blood in stool, constipation,  diarrhea, nausea and vomiting.  Endocrine: Negative.  Negative for polydipsia, polyphagia and polyuria.  Genitourinary: Negative.  Negative for difficulty urinating, enuresis, frequency, hematuria, scrotal swelling, testicular pain and urgency.  Musculoskeletal: Negative.  Negative for back pain, myalgias and neck pain.  Skin: Negative.  Negative for color change, pallor and rash.  Allergic/Immunologic: Negative.   Neurological: Negative.   Hematological: Negative.  Negative for adenopathy. Does not bruise/bleed easily.  Psychiatric/Behavioral: Negative.  Negative for decreased concentration, dysphoric mood and suicidal ideas. The patient is not nervous/anxious.     Objective:  BP 114/70 (BP Location: Left Arm, Patient Position: Sitting, Cuff Size: Normal)   Pulse 78   Temp 98.2 F (36.8 C) (Oral)   Ht '5\' 7"'  (1.702 m)   Wt 186 lb 8 oz (84.6 kg)   SpO2 93%   BMI 29.21 kg/m   BP Readings from Last 3 Encounters:  05/05/16 114/70  03/27/16 118/66  12/12/15 140/78    Wt Readings from Last 3 Encounters:  05/05/16 186 lb 8 oz (84.6 kg)  03/27/16 183 lb (83 kg)  12/12/15 191 lb (86.6 kg)    Physical Exam  Constitutional: He is oriented to person, place, and time. He appears well-developed and well-nourished. No distress.  HENT:  Mouth/Throat: Oropharynx is clear and moist. No oropharyngeal exudate.  Eyes: Conjunctivae are normal. Right eye exhibits no discharge. Left eye exhibits no discharge. No scleral icterus.  Neck: Normal range of motion. Neck supple. No JVD present. No tracheal deviation present. No thyromegaly present.  Cardiovascular: Normal rate, regular rhythm, normal heart sounds and intact distal pulses.  Exam reveals no gallop and no friction rub.   No murmur heard. Pulmonary/Chest: Effort normal. No accessory muscle usage or stridor. No tachypnea. No respiratory distress. He has no decreased breath sounds. He has wheezes in the right upper field, the right middle  field, the right lower field, the left upper field, the left middle field and the left lower field. He has rhonchi in the right upper field, the right middle field, the right lower field, the left upper field, the left middle field and the left lower field. He has no rales. He exhibits no tenderness.  He has diffuse, bilateral, late faint expiratory wheezes and rhonchi and good air movement.  Abdominal: Soft. Bowel sounds are normal. He exhibits no distension and no mass. There is no tenderness. There is no rebound and no guarding.  Musculoskeletal: Normal range of motion. He exhibits edema. He exhibits no tenderness or deformity.  Lymphadenopathy:    He has no cervical adenopathy.  Neurological: He is oriented to person, place, and time.  Skin: Skin is warm and dry. No rash noted. He is not diaphoretic. No erythema. No pallor.  Vitals  reviewed.   Lab Results  Component Value Date   WBC 5.3 12/12/2015   HGB 14.2 12/12/2015   HCT 42.5 12/12/2015   PLT 200.0 12/12/2015   GLUCOSE 126 (H) 05/05/2016   CHOL 161 12/12/2015   TRIG 126.0 12/12/2015   HDL 47.80 12/12/2015   LDLCALC 88 12/12/2015   ALT 61 (H) 05/05/2016   AST 54 (H) 05/05/2016   NA 139 05/05/2016   K 4.8 05/05/2016   CL 99 05/05/2016   CREATININE 0.90 05/05/2016   BUN 20 05/05/2016   CO2 36 (H) 05/05/2016   TSH 2.18 12/12/2015   HGBA1C 6.8 (H) 05/05/2016   MICROALBUR 0.8 12/12/2015    Dg Foot Complete Right  Result Date: 05/17/2012 *RADIOLOGY REPORT* Clinical Data: Right-sided foot pain. RIGHT FOOT COMPLETE - 3+ VIEW Comparison: No priors. Findings: Three views of the right foot demonstrate no acute displaced fracture, subluxation or dislocation.  Soft tissues are unremarkable.  Dorsal calcaneal spur incidentally noted. Joint space narrowing, subchondral sclerosis and osteophyte formation are noted throughout the midfoot, and to a lesser extent at the first MTP joint, compatible with osteoarthritis. IMPRESSION: 1.  No  acute radiographic abnormality of the right foot. 2.  Degenerative changes of osteoarthritis, as above. Original Report Authenticated By: Vinnie Langton, M.D.    Assessment & Plan:   Rondell was seen today for annual exam, diabetes, gynecologic exam and copd.  Diagnoses and all orders for this visit:  Essential hypertension, benign- his blood pressure is well-controlled, electrolytes and renal function are normal. -     Comprehensive metabolic panel; Future  Diabetes mellitus type 2 with neurological manifestations ([place])- his A1c is at 6.8%, his blood sugars are adequately well controlled. -     Comprehensive metabolic panel; Future -     Hemoglobin A1c; Future  Wheezing- he has a mildly elevated FeNO score and a history of asthma so I have asked him to start using an inhaled corticosteroid. -     POCT EXHALED NITRIC OXIDE  COPD (chronic obstructive pulmonary disease) with chronic bronchitis ([place])- he has a history of asthma and it sounds like he has developed COPD, I've asked him to start triple therapy with a LABA/LAMA/ICS. I gave him a sample of trelegy and showed him how to use it. He demonstrated proficiency with its use. -     Fluticasone-Umeclidin-Vilant (TRELEGY ELLIPTA) 100-62.5-25 MCG/INH AEPB; Inhale 1 puff into the lungs daily.   I am having Mr. Sides start on Fluticasone-Umeclidin-Vilant. I am also having him maintain his aspirin EC, multivitamin, Ciclopirox, irbesartan-hydrochlorothiazide, metFORMIN, esomeprazole, nitroGLYCERIN, atorvastatin, ONETOUCH VERIO FLEX SYSTEM, glucose blood, ONETOUCH DELICA LANCETS 13G, and metoprolol tartrate.  Meds ordered this encounter  Medications  . Fluticasone-Umeclidin-Vilant (TRELEGY ELLIPTA) 100-62.5-25 MCG/INH AEPB    Sig: Inhale 1 puff into the lungs daily.    Dispense:  28 each    Refill:  11   See AVS for instructions about healthy living and anticipatory guidance.  Follow-up: Return in about 6 months (around 11/05/2016).  Scarlette Calico, MD

## 2016-05-05 NOTE — Progress Notes (Signed)
Pre visit review using our clinic review tool, if applicable. No additional management support is needed unless otherwise documented below in the visit note. 

## 2016-05-06 ENCOUNTER — Other Ambulatory Visit: Payer: Self-pay | Admitting: Cardiology

## 2016-05-06 NOTE — Assessment & Plan Note (Signed)

## 2016-05-07 ENCOUNTER — Telehealth: Payer: Self-pay

## 2016-05-07 ENCOUNTER — Other Ambulatory Visit: Payer: Self-pay | Admitting: Internal Medicine

## 2016-05-07 DIAGNOSIS — J4530 Mild persistent asthma, uncomplicated: Secondary | ICD-10-CM

## 2016-05-07 DIAGNOSIS — J449 Chronic obstructive pulmonary disease, unspecified: Secondary | ICD-10-CM

## 2016-05-07 MED ORDER — BECLOMETHASONE DIPROPIONATE 40 MCG/ACT IN AERS: 2.0000 | INHALATION_SPRAY | Freq: Two times a day (BID) | RESPIRATORY_TRACT | 3 refills | Status: DC

## 2016-05-07 MED ORDER — UMECLIDINIUM-VILANTEROL 62.5-25 MCG/INH IN AEPB: 1.0000 | INHALATION_SPRAY | Freq: Every day | RESPIRATORY_TRACT | 3 refills | Status: DC

## 2016-05-07 NOTE — Telephone Encounter (Signed)
Pharmacy is requesting an alternative. Trelegy is not covered. Anoro and Bevespi are covered.

## 2016-05-07 NOTE — Telephone Encounter (Signed)
Changed to Anoro He also needs a steroid inhaler so another one sent as well

## 2016-05-08 NOTE — Telephone Encounter (Signed)
LVM for pt to call back as soon as possible.   

## 2016-05-08 NOTE — Telephone Encounter (Signed)
Patients daughter called back. I informed her of the notes. She understood. Thank you.

## 2016-05-20 DIAGNOSIS — L821 Other seborrheic keratosis: Secondary | ICD-10-CM | POA: Diagnosis not present

## 2016-05-20 DIAGNOSIS — Z85828 Personal history of other malignant neoplasm of skin: Secondary | ICD-10-CM | POA: Diagnosis not present

## 2016-05-20 DIAGNOSIS — L570 Actinic keratosis: Secondary | ICD-10-CM | POA: Diagnosis not present

## 2016-05-20 DIAGNOSIS — Z23 Encounter for immunization: Secondary | ICD-10-CM | POA: Diagnosis not present

## 2016-05-20 DIAGNOSIS — L719 Rosacea, unspecified: Secondary | ICD-10-CM | POA: Diagnosis not present

## 2016-05-20 DIAGNOSIS — D0439 Carcinoma in situ of skin of other parts of face: Secondary | ICD-10-CM | POA: Diagnosis not present

## 2016-05-20 DIAGNOSIS — D485 Neoplasm of uncertain behavior of skin: Secondary | ICD-10-CM | POA: Diagnosis not present

## 2016-05-20 DIAGNOSIS — D692 Other nonthrombocytopenic purpura: Secondary | ICD-10-CM | POA: Diagnosis not present

## 2016-05-29 ENCOUNTER — Other Ambulatory Visit: Payer: Self-pay | Admitting: Internal Medicine

## 2016-06-04 ENCOUNTER — Other Ambulatory Visit: Payer: Self-pay | Admitting: Cardiology

## 2016-06-24 ENCOUNTER — Ambulatory Visit: Payer: Medicare HMO | Admitting: Cardiology

## 2016-06-25 ENCOUNTER — Ambulatory Visit (INDEPENDENT_AMBULATORY_CARE_PROVIDER_SITE_OTHER): Payer: Medicare HMO | Admitting: Cardiology

## 2016-06-25 ENCOUNTER — Encounter: Payer: Self-pay | Admitting: Cardiology

## 2016-06-25 VITALS — BP 132/70 | HR 63 | Ht 70.0 in | Wt 194.0 lb

## 2016-06-25 DIAGNOSIS — I25119 Atherosclerotic heart disease of native coronary artery with unspecified angina pectoris: Secondary | ICD-10-CM

## 2016-06-25 DIAGNOSIS — I10 Essential (primary) hypertension: Secondary | ICD-10-CM | POA: Diagnosis not present

## 2016-06-25 DIAGNOSIS — E785 Hyperlipidemia, unspecified: Secondary | ICD-10-CM

## 2016-06-25 MED ORDER — METOPROLOL TARTRATE 25 MG PO TABS: ORAL_TABLET | ORAL | 3 refills | Status: DC

## 2016-06-25 NOTE — Progress Notes (Signed)
Cardiology Office Note:    Date:  06/25/2016   ID:  Manuel [name], DOB 12/16/1928, MRN [id number]  PCP:  Scarlette Calico, MD  Cardiologist:  [name], MD     Referring MD: [name], MD   Chief Complaint  Patient presents with  . Follow-up    History of Present Illness:    Manuel [name] is a 83 y.o. male former patient of Dr. Claris Gladden with history of CAD status post myocardial infarction in January 2011 with diabetes hypertension hyperlipidemia here for follow-up.  He developed bilateral shoulder pain as angina in 2011 and was found to have MI. Heart catheterization showed 99% mid circumflex which is treated with DES. EF was normal. In [place].   Nuclear stress test in February 2015 showed no ischemia.  Overall he is doing quite well. Still walking. Golf. Occasional atypical chest discomfort.  Past Medical History:  Diagnosis Date  . Allergic rhinitis    childhood  . Asthma    Allergic component  . CAD (coronary artery disease)    s/p MI in 1/11. LHC (1/11) with 99% mCFX treated with 2.5 x 16 Xience V DES; 50% mLAD; EF 55%. ETT-myoview (10/12): 4'51", no significant ST segment changes, EF 56%, no ischemia or infarction.  . 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 Chronic kidney disease    stones  . Diabetes mellitus   . Dizziness    Holter (10/12): Frequent PACs and PVCs. No significant bradycardia.   [name] GERD (gastroesophageal reflux disease)   . Hx of cardiovascular stress test    ETT-Myoview (03/2013):  Inf defect on short axis images only (not felt to be significant), EF 68%; low risk.  [name] Hyperlipidemia   . Hypertension   . MI (myocardial infarction) ([place]) 02/2009  . Pneumonia    age 30   PMH: 1. Asthma: Allergic component 2. CAD: s/p MI in 1/11.  LHC (1/11) with 99% mCFX treated with 2.5 x 16 Xience V DES; 50% mLAD; EF 55%.  ETT-myoview (10/12): 4'51", no significant ST segment changes, EF 56%, no ischemia or infarction. ETT-Cardiolite (2/15): 4'15", probably normal  with no ischemia/infarction, EF 68%.  3. GERD 4. Appendectomy 5. HTN 6. Hyperlipidemia 7. Diabetes mellitus type II 8. Lightheaded spells: Holter (10/12): Frequent PACs and PVCs.  No significant bradycardia.  Probably due primarily to BPPV.    FH: Parents lived into their 72s.  No premature CAD.    SH: Moved from [place], [place] to [place].  Married, originally from [place].  Now living in daughter's house.  Quit smoking 1990, retired Hotel manager, occasional smoker.    Past Surgical History:  Procedure Laterality Date  . APPENDECTOMY    . CORONARY STENT PLACEMENT      Current Medications: Current Meds  Medication Sig  . aspirin EC 81 MG EC tablet Take 1 tablet (81 mg total) by mouth daily.  [name] atorvastatin (LIPITOR) 40 MG tablet TAKE 1 TABLET (40 MG TOTAL) BY MOUTH DAILY.  . beclomethasone (QVAR) 40 MCG/ACT inhaler Inhale 2 puffs into the lungs 2 (two) times daily.  . Blood Glucose Monitoring Suppl (Evan) w/Device KIT 1 Device by Does not apply route daily. Use to check blood sugars daily Dx E11.9  . Ciclopirox 0.77 % gel Apply 1 Act topically 2 (two) times daily.  [name] esomeprazole (NEXIUM) 40 MG capsule TAKE ONE CAPSULE BY MOUTH EVERY DAY  . glucose blood (ONETOUCH VERIO) test strip 1 each by  Other route 2 (two) times daily. Use to check blood sugars twice a day Dx E11.9  . irbesartan-hydrochlorothiazide (AVALIDE) 300-12.5 MG tablet Take 0.5 tablets by mouth 2 (two) times daily.   . metFORMIN (GLUCOPHAGE) 500 MG tablet Take 750 mg by mouth 2 (two) times daily with a meal.   . Multiple Vitamin (MULTIVITAMIN) tablet Take 1 tablet by mouth daily.  . nitroGLYCERIN (NITROSTAT) 0.4 MG SL tablet Place 1 tablet (0.4 mg total) under the tongue every 5 (five) minutes as needed for chest pain (x 3 doses).  Glory Rosebush DELICA LANCETS 03K MISC Use to help check blood sugars twice a day Dx E11.9  . [DISCONTINUED] metoprolol tartrate (LOPRESSOR) 25 MG tablet TAKE 1/2 TABLET  BY MOUTH TWICE DAILY *PT NEEDS FOLLOW UP APPT FOR MORE REFILLS**     Allergies:   Other   Social History   Social History  . Marital status: Single    Spouse name: N/A  . Number of children: N/A  . Years of education: N/A   Occupational History  . retired    Social History Main Topics  . Smoking status: Former Smoker    Types: Cigarettes    Quit date: 06/09/1966  . Smokeless tobacco: Never Used  . Alcohol use No  . Drug use: No  . Sexual activity: Not Currently   Other Topics Concern  . None   Social History Narrative   Associates degree.      family history includes Asthma in his father; Diabetes in his mother; Heart attack in his father; Heart failure in his mother. There is no history of Cancer. ROS:   Please see the history of present illness.     All other systems reviewed and are negative.   EKGs/Labs/Other Studies Reviewed:    EKG:  EKG is  ordered today.  The ekg ordered today demonstrates 06/25/16-sinus rhythm first degree AV block PR interval 214 ms with nonspecific T-wave flattening personally viewed, left axis deviation.  Recent Labs: 12/12/2015: Hemoglobin 14.2; Platelets 200.0; TSH 2.18 05/05/2016: ALT 61; BUN 20; Creatinine, Ser 0.90; Potassium 4.8; Sodium 139   Recent Lipid Panel    Component Value Date/Time   CHOL 161 12/12/2015 0826   TRIG 126.0 12/12/2015 0826   HDL 47.80 12/12/2015 0826   CHOLHDL 3 12/12/2015 0826   VLDL 25.2 12/12/2015 0826   LDLCALC 88 12/12/2015 0826    Physical Exam:    VS:  BP 132/70   Pulse 63   Ht _0  (1.778 m)   Wt 194 lb (88 kg)   BMI 27.84 kg/m     Wt Readings from Last 3 Encounters:  06/25/16 194 lb (88 kg)  05/05/16 186 lb 8 oz (84.6 kg)  03/27/16 183 lb (83 kg)     GEN:  Well nourished, well developed in no acute distress HEENT: Normal NECK: No JVD; No carotid bruits LYMPHATICS: No lymphadenopathy CARDIAC: RRR, no murmurs, rubs, gallops RESPIRATORY:  Clear to auscultation without rales,  wheezing or rhonchi  ABDOMEN: Soft, non-tender, non-distended MUSCULOSKELETAL:  No edema; No deformity  SKIN: Warm and dry NEUROLOGIC:  Alert and oriented x 3 PSYCHIATRIC:  Normal affect   ASSESSMENT:    1. Atherosclerosis of native coronary artery of native heart with angina pectoris ([place])   2. Essential hypertension, benign   3. Hyperlipidemia with target LDL less than 100    PLAN:    In order of problems listed above:  Coronary artery disease  - Stable, post circumflex DES  2011. Doing well, no anginal symptoms.  - Continue current medications as above. Currently his angina is well controlled.  Essential hypertension  - Well controlled on current meds.  Hyperlipidemia  - Excellent lipids, statin. No changes made. Dr. Ronnald Ramp has been following labs.  Obesity  - Encourage weight loss. He's gained approximately 10 pounds after his wife fractured her pelvis.   Medication Adjustments/Labs and Tests Ordered: Current medicines are reviewed at length with the patient today.  Concerns regarding medicines are outlined above. Labs and tests ordered and medication changes are outlined in the patient instructions below:  Patient Instructions  Medication Instructions:  Your physician recommends that you continue on your current medications as directed. Please refer to the Current Medication list given to you today.   Labwork: None  Testing/Procedures: None   Follow-Up: Your physician wants you to follow-up in: 1 year with Dr. Marlou Porch. You will receive a reminder letter in the mail two months in advance. If you don't receive a letter, please call our office to schedule the follow-up appointment.   Any Other Special Instructions Will Be Listed Below (If Applicable).     If you need a refill on your cardiac medications before your next appointment, please call your pharmacy.      Signed, [name], MD  06/25/2016 12:20 PM    [facility]

## 2016-06-25 NOTE — Patient Instructions (Signed)

## 2016-07-05 ENCOUNTER — Other Ambulatory Visit: Payer: Self-pay | Admitting: Internal Medicine

## 2016-07-29 DIAGNOSIS — D0439 Carcinoma in situ of skin of other parts of face: Secondary | ICD-10-CM | POA: Diagnosis not present

## 2016-07-29 DIAGNOSIS — L821 Other seborrheic keratosis: Secondary | ICD-10-CM | POA: Diagnosis not present

## 2016-07-29 DIAGNOSIS — L570 Actinic keratosis: Secondary | ICD-10-CM | POA: Diagnosis not present

## 2016-09-01 ENCOUNTER — Other Ambulatory Visit: Payer: Self-pay | Admitting: Internal Medicine

## 2016-09-30 DIAGNOSIS — B353 Tinea pedis: Secondary | ICD-10-CM | POA: Diagnosis not present

## 2016-09-30 DIAGNOSIS — D0439 Carcinoma in situ of skin of other parts of face: Secondary | ICD-10-CM | POA: Diagnosis not present

## 2016-11-02 ENCOUNTER — Other Ambulatory Visit: Payer: Self-pay | Admitting: Internal Medicine

## 2016-12-01 DIAGNOSIS — R69 Illness, unspecified: Secondary | ICD-10-CM | POA: Diagnosis not present

## 2016-12-02 DIAGNOSIS — B353 Tinea pedis: Secondary | ICD-10-CM | POA: Diagnosis not present

## 2016-12-02 DIAGNOSIS — D485 Neoplasm of uncertain behavior of skin: Secondary | ICD-10-CM | POA: Diagnosis not present

## 2016-12-02 DIAGNOSIS — Z23 Encounter for immunization: Secondary | ICD-10-CM | POA: Diagnosis not present

## 2016-12-02 DIAGNOSIS — D0439 Carcinoma in situ of skin of other parts of face: Secondary | ICD-10-CM | POA: Diagnosis not present

## 2016-12-04 ENCOUNTER — Other Ambulatory Visit: Payer: Self-pay | Admitting: Internal Medicine

## 2016-12-16 DIAGNOSIS — D485 Neoplasm of uncertain behavior of skin: Secondary | ICD-10-CM | POA: Diagnosis not present

## 2016-12-16 DIAGNOSIS — D0430 Carcinoma in situ of skin of unspecified part of face: Secondary | ICD-10-CM | POA: Diagnosis not present

## 2016-12-16 DIAGNOSIS — L080 Pyoderma: Secondary | ICD-10-CM | POA: Diagnosis not present

## 2016-12-22 ENCOUNTER — Other Ambulatory Visit: Payer: Self-pay | Admitting: Internal Medicine

## 2016-12-23 ENCOUNTER — Other Ambulatory Visit: Payer: Self-pay | Admitting: Cardiology

## 2017-01-07 LAB — HM DIABETES EYE EXAM

## 2017-01-31 ENCOUNTER — Other Ambulatory Visit: Payer: Self-pay | Admitting: Internal Medicine

## 2017-02-05 ENCOUNTER — Other Ambulatory Visit: Payer: Self-pay | Admitting: Internal Medicine

## 2017-02-06 ENCOUNTER — Encounter: Payer: Self-pay | Admitting: Internal Medicine

## 2017-02-06 ENCOUNTER — Ambulatory Visit: Payer: Medicare HMO | Admitting: Internal Medicine

## 2017-02-06 ENCOUNTER — Other Ambulatory Visit (INDEPENDENT_AMBULATORY_CARE_PROVIDER_SITE_OTHER): Payer: Medicare HMO

## 2017-02-06 VITALS — BP 134/64 | HR 65 | Temp 98.8°F | Ht 70.0 in | Wt 194.0 lb

## 2017-02-06 DIAGNOSIS — E1149 Type 2 diabetes mellitus with other diabetic neurological complication: Secondary | ICD-10-CM

## 2017-02-06 DIAGNOSIS — I10 Essential (primary) hypertension: Secondary | ICD-10-CM

## 2017-02-06 DIAGNOSIS — E785 Hyperlipidemia, unspecified: Secondary | ICD-10-CM | POA: Diagnosis not present

## 2017-02-06 DIAGNOSIS — K219 Gastro-esophageal reflux disease without esophagitis: Secondary | ICD-10-CM

## 2017-02-06 DIAGNOSIS — I2510 Atherosclerotic heart disease of native coronary artery without angina pectoris: Secondary | ICD-10-CM

## 2017-02-06 LAB — COMPREHENSIVE METABOLIC PANEL
ALT: 84 U/L — AB (ref 0–53)
AST: 92 U/L — AB (ref 0–37)
Albumin: 4 g/dL (ref 3.5–5.2)
Alkaline Phosphatase: 97 U/L (ref 39–117)
BILIRUBIN TOTAL: 0.6 mg/dL (ref 0.2–1.2)
BUN: 19 mg/dL (ref 6–23)
CO2: 34 meq/L — AB (ref 19–32)
CREATININE: 0.99 mg/dL (ref 0.40–1.50)
Calcium: 9.2 mg/dL (ref 8.4–10.5)
Chloride: 98 mEq/L (ref 96–112)
GFR: 76.04 mL/min (ref >60.00)
GLUCOSE: 87 mg/dL (ref 70–99)
Potassium: 4.5 mEq/L (ref 3.5–5.1)
SODIUM: 138 meq/L (ref 135–145)
TOTAL PROTEIN: 7.3 g/dL (ref 6.0–8.3)

## 2017-02-06 LAB — MICROALBUMIN / CREATININE URINE RATIO
Creatinine,U: 84.3 mg/dL
MICROALB/CREAT RATIO: 1.5 mg/g (ref 0.0–30.0)
Microalb, Ur: 1.3 mg/dL (ref 0.0–1.9)

## 2017-02-06 LAB — CBC WITH DIFFERENTIAL/PLATELET
BASOS ABS: 0.1 10*3/uL (ref 0.0–0.1)
Basophils Relative: 1.4 % (ref 0.0–3.0)
EOS ABS: 0.3 10*3/uL (ref 0.0–0.7)
Eosinophils Relative: 7 % — ABNORMAL HIGH (ref 0.0–5.0)
HCT: 41.8 % (ref 39.0–52.0)
Hemoglobin: 13.9 g/dL (ref 13.0–17.0)
LYMPHS ABS: 1.3 10*3/uL (ref 0.7–4.0)
Lymphocytes Relative: 29.3 % (ref 12.0–46.0)
MCHC: 33.2 g/dL (ref 30.0–36.0)
MCV: 95.9 fl (ref 78.0–100.0)
MONO ABS: 0.5 10*3/uL (ref 0.1–1.0)
Monocytes Relative: 11.1 % (ref 3.0–12.0)
NEUTROS ABS: 2.2 10*3/uL (ref 1.4–7.7)
NEUTROS PCT: 51.2 % (ref 43.0–77.0)
PLATELETS: 164 10*3/uL (ref 150.0–400.0)
RBC: 4.36 Mil/uL (ref 4.22–5.81)
RDW: 14 % (ref 11.5–15.5)
WBC: 4.3 10*3/uL (ref 4.0–10.5)

## 2017-02-06 LAB — LIPID PANEL
CHOL/HDL RATIO: 3
Cholesterol: 145 mg/dL (ref 0–200)
HDL: 42.5 mg/dL (ref >39.00)
LDL CALC: 66 mg/dL (ref 0–99)
NONHDL: 102.73
TRIGLYCERIDES: 183 mg/dL — AB (ref 0.0–149.0)
VLDL: 36.6 mg/dL (ref 0.0–40.0)

## 2017-02-06 LAB — HEMOGLOBIN A1C: Hgb A1c MFr Bld: 6.8 % — ABNORMAL HIGH (ref 4.6–6.5)

## 2017-02-06 LAB — URINALYSIS, ROUTINE W REFLEX MICROSCOPIC
Bilirubin Urine: NEGATIVE
Hgb urine dipstick: NEGATIVE
KETONES UR: NEGATIVE
LEUKOCYTES UA: NEGATIVE
NITRITE: NEGATIVE
SPECIFIC GRAVITY, URINE: 1.015 (ref 1.000–1.030)
TOTAL PROTEIN, URINE-UPE24: NEGATIVE
URINE GLUCOSE: NEGATIVE
Urobilinogen, UA: 4 — AB (ref 0.0–1.0)
pH: 6.5 (ref 5.0–8.0)

## 2017-02-06 MED ORDER — ATORVASTATIN CALCIUM 40 MG PO TABS: ORAL_TABLET | ORAL | 1 refills | Status: DC

## 2017-02-06 MED ORDER — ESOMEPRAZOLE MAGNESIUM 40 MG PO CPDR: 40.0000 mg | DELAYED_RELEASE_CAPSULE | Freq: Every day | ORAL | 1 refills | Status: DC

## 2017-02-06 MED ORDER — METOPROLOL TARTRATE 25 MG PO TABS: ORAL_TABLET | ORAL | 3 refills | Status: DC

## 2017-02-06 MED ORDER — METFORMIN HCL 500 MG PO TABS: ORAL_TABLET | ORAL | 1 refills | Status: DC

## 2017-02-06 MED ORDER — IRBESARTAN-HYDROCHLOROTHIAZIDE 300-12.5 MG PO TABS: 1.0000 | ORAL_TABLET | Freq: Every day | ORAL | 1 refills | Status: DC

## 2017-02-06 NOTE — Progress Notes (Signed)
Subjective:  Patient ID: Manuel [name], male    DOB: 1929/02/05  Age: 83 y.o. MRN: [id number]  CC: Hypertension; Hyperlipidemia; Diabetes; and Coronary Artery Disease   HPI Manuel [name] presents for f/up -he has felt well recently.  He tells me that he walks, does exercise class, and plays golf and has had no recent episodes of CP, DOE, S OB, palpitations, edema, or fatigue.  He also tells me his blood pressure and his blood sugars have been well controlled.  Outpatient Medications Prior to Visit  Medication Sig Dispense Refill  . aspirin EC 81 MG EC tablet Take 1 tablet (81 mg total) by mouth daily.    . beclomethasone (QVAR) 40 MCG/ACT inhaler Inhale 2 puffs into the lungs 2 (two) times daily. 3 Inhaler 3  . Blood Glucose Monitoring Suppl (ONETOUCH VERIO FLEX SYSTEM) w/Device KIT 1 Device by Does not apply route daily. Use to check blood sugars daily Dx E11.9 1 kit 0  . glucose blood (ONETOUCH VERIO) test strip 1 each by Other route 2 (two) times daily. Use to check blood sugars twice a day Dx E11.9 100 each 3  . Multiple Vitamin (MULTIVITAMIN) tablet Take 1 tablet by mouth daily.    Manuel [name] DELICA LANCETS 50K MISC Use to help check blood sugars twice a day Dx E11.9 100 each 3  . atorvastatin (LIPITOR) 40 MG tablet TAKE 1 TABLET (40 MG TOTAL) BY MOUTH DAILY. 90 tablet 1  . Ciclopirox 0.77 % gel Apply 1 Act topically 2 (two) times daily. 100 g 3  . esomeprazole (NEXIUM) 40 MG capsule TAKE ONE CAPSULE BY MOUTH EVERY DAY 90 capsule 3  . irbesartan-hydrochlorothiazide (AVALIDE) 300-12.5 MG tablet Take 1 tablet by mouth daily. 90 tablet 0  . metFORMIN (GLUCOPHAGE) 500 MG tablet Take 750 mg by mouth 2 (two) times daily with a meal.     . metFORMIN (GLUCOPHAGE) 500 MG tablet TAKE 2 TABLETS (1,000 MG TOTAL) BY MOUTH 2 (TWO) TIMES DAILY. 360 tablet 0  . metoprolol tartrate (LOPRESSOR) 25 MG tablet TAKE 1/2 TABLET BY MOUTH TWICE DAILY *PT NEEDS FOLLOW UP APPT FOR MORE REFILLS** 90 tablet 3  .  nitroGLYCERIN (NITROSTAT) 0.4 MG SL tablet DISSOLVE 1 TABLET UNDER TONGUE EVERY 5 MINUTES AS NEEDED FOR CHEST PAIN FOR 3 DOSES (Patient not taking: Reported on 02/06/2017) 25 tablet 0   No facility-administered medications prior to visit.     ROS Review of Systems  Constitutional: Negative for activity change, diaphoresis, fatigue and unexpected weight change.  HENT: Negative.   Eyes: Negative.   Respiratory: Negative for cough, chest tightness, shortness of breath and wheezing.   Cardiovascular: Negative for chest pain, palpitations and leg swelling.  Gastrointestinal: Negative for abdominal pain, constipation, diarrhea, nausea and vomiting.  Endocrine: Negative for polydipsia, polyphagia and polyuria.  Genitourinary: Negative for difficulty urinating.  Musculoskeletal: Negative.  Negative for arthralgias and myalgias.  Skin: Negative.  Negative for color change and rash.  Allergic/Immunologic: Negative.   Neurological: Negative.  Negative for dizziness.  Hematological: Negative for adenopathy. Does not bruise/bleed easily.  Psychiatric/Behavioral: Negative.     Objective:  BP 134/64 (BP Location: Left Arm, Patient Position: Sitting, Cuff Size: Normal)   Pulse 65   Temp 98.8 F (37.1 C) (Oral)   Ht _0  (1.778 m)   Wt 194 lb (88 kg)   SpO2 94%   BMI 27.84 kg/m   BP Readings from Last 3 Encounters:  02/06/17 134/64  06/25/16 132/70  05/05/16 114/70    Wt Readings from Last 3 Encounters:  02/06/17 194 lb (88 kg)  06/25/16 194 lb (88 kg)  05/05/16 186 lb 8 oz (84.6 kg)    Physical Exam  Constitutional: He is oriented to person, place, and time. No distress.  HENT:  Mouth/Throat: No oropharyngeal exudate.  Eyes: Conjunctivae are normal. Left eye exhibits no discharge. No scleral icterus.  Neck: Normal range of motion. Neck supple. No JVD present. No thyromegaly present.  Cardiovascular: Normal rate, regular rhythm and normal heart sounds. Exam reveals no gallop.    No murmur heard. Pulmonary/Chest: Effort normal and breath sounds normal. He has no wheezes. He has no rales.  Abdominal: Soft. Bowel sounds are normal. He exhibits no mass. There is no tenderness. There is no guarding.  Musculoskeletal: Normal range of motion. He exhibits edema (1+ LE edema). He exhibits no tenderness or deformity.  Lymphadenopathy:    He has no cervical adenopathy.  Neurological: He is alert and oriented to person, place, and time.  Skin: Skin is warm and dry. No rash noted. He is not diaphoretic. No erythema. No pallor.  Nursing note and vitals reviewed.   Lab Results  Component Value Date   WBC 4.3 02/06/2017   HGB 13.9 02/06/2017   HCT 41.8 02/06/2017   PLT 164.0 02/06/2017   GLUCOSE 87 02/06/2017   CHOL 145 02/06/2017   TRIG 183.0 (H) 02/06/2017   HDL 42.50 02/06/2017   LDLCALC 66 02/06/2017   ALT 84 (H) 02/06/2017   AST 92 (H) 02/06/2017   NA 138 02/06/2017   K 4.5 02/06/2017   CL 98 02/06/2017   CREATININE 0.99 02/06/2017   BUN 19 02/06/2017   CO2 34 (H) 02/06/2017   TSH 2.18 02/06/2017   HGBA1C 6.8 (H) 02/06/2017   MICROALBUR 1.3 02/06/2017    Dg Foot Complete Right  Result Date: 05/17/2012 *RADIOLOGY REPORT* Clinical Data: Right-sided foot pain. RIGHT FOOT COMPLETE - 3+ VIEW Comparison: No priors. Findings: Three views of the right foot demonstrate no acute displaced fracture, subluxation or dislocation.  Soft tissues are unremarkable.  Dorsal calcaneal spur incidentally noted. Joint space narrowing, subchondral sclerosis and osteophyte formation are noted throughout the midfoot, and to a lesser extent at the first MTP joint, compatible with osteoarthritis. IMPRESSION: 1.  No acute radiographic abnormality of the right foot. 2.  Degenerative changes of osteoarthritis, as above. Original Report Authenticated By: Manuel [name], M.D.    Assessment & Plan:   Manuel [name] was seen today for hypertension, hyperlipidemia, diabetes and coronary artery  disease.  Diagnoses and all orders for this visit:  Diabetes mellitus type 2 with neurological manifestations ([place])- His A1c is at 6.8%.  His blood sugars are adequately well controlled.  Will continue Metformin and an ARB for renal protection. -     Comprehensive metabolic panel; Future -     Hemoglobin A1c; Future -     Microalbumin / creatinine urine ratio; Future -     irbesartan-hydrochlorothiazide (AVALIDE) 300-12.5 MG tablet; Take 1 tablet by mouth daily. -     metFORMIN (GLUCOPHAGE) 500 MG tablet; TAKE 2 TABLETS (1,000 MG TOTAL) BY MOUTH 2 (TWO) TIMES DAILY.  Atherosclerosis of native coronary artery of native heart without angina pectoris- He has had no recent episodes of angina.  Will continue aggressive risk factor modification. -     Lipid panel; Future -     metoprolol tartrate (LOPRESSOR) 25 MG tablet; TAKE 1/2 TABLET BY MOUTH TWICE DAILY *PT NEEDS  FOLLOW UP APPT FOR MORE REFILLS**  Essential hypertension, benign- His blood pressure is well controlled.  Electrolytes and renal function are normal. -     CBC with Differential/Platelet; Future -     Thyroid Panel With TSH; Future -     Urinalysis, Routine w reflex microscopic; Future -     irbesartan-hydrochlorothiazide (AVALIDE) 300-12.5 MG tablet; Take 1 tablet by mouth daily. -     metoprolol tartrate (LOPRESSOR) 25 MG tablet; TAKE 1/2 TABLET BY MOUTH TWICE DAILY *PT NEEDS FOLLOW UP APPT FOR MORE REFILLS**  Hyperlipidemia with target LDL less than 100- He has achieved his LDL goal and is doing well on the statin. -     Lipid panel; Future -     Thyroid Panel With TSH; Future -     atorvastatin (LIPITOR) 40 MG tablet; TAKE 1 TABLET (40 MG TOTAL) BY MOUTH DAILY.  Gastroesophageal reflux disease without esophagitis- His symptoms are well controlled.  There are no alarm features.  Will continue the PPI at the current dose. -     esomeprazole (NEXIUM) 40 MG capsule; Take 1 capsule (40 mg total) by mouth daily.   I have  discontinued Dustan H. Trulson's Ciclopirox. I have also changed his esomeprazole. Additionally, I am having him maintain his aspirin EC, multivitamin, ONETOUCH VERIO FLEX SYSTEM, glucose blood, ONETOUCH DELICA LANCETS 35T, beclomethasone, nitroGLYCERIN, irbesartan-hydrochlorothiazide, metFORMIN, atorvastatin, and metoprolol tartrate.  Meds ordered this encounter  Medications  . irbesartan-hydrochlorothiazide (AVALIDE) 300-12.5 MG tablet    Sig: Take 1 tablet by mouth daily.    Dispense:  90 tablet    Refill:  1  . metFORMIN (GLUCOPHAGE) 500 MG tablet    Sig: TAKE 2 TABLETS (1,000 MG TOTAL) BY MOUTH 2 (TWO) TIMES DAILY.    Dispense:  360 tablet    Refill:  1  . esomeprazole (NEXIUM) 40 MG capsule    Sig: Take 1 capsule (40 mg total) by mouth daily.    Dispense:  90 capsule    Refill:  1  . atorvastatin (LIPITOR) 40 MG tablet    Sig: TAKE 1 TABLET (40 MG TOTAL) BY MOUTH DAILY.    Dispense:  90 tablet    Refill:  1  . metoprolol tartrate (LOPRESSOR) 25 MG tablet    Sig: TAKE 1/2 TABLET BY MOUTH TWICE DAILY *PT NEEDS FOLLOW UP APPT FOR MORE REFILLS**    Dispense:  90 tablet    Refill:  3     Follow-up: Return in about 6 months (around 08/07/2017).  Scarlette Calico, MD

## 2017-02-06 NOTE — Patient Instructions (Signed)

## 2017-02-07 ENCOUNTER — Encounter: Payer: Self-pay | Admitting: Internal Medicine

## 2017-02-07 LAB — THYROID PANEL WITH TSH
FREE THYROXINE INDEX: 2.1 (ref 1.4–3.8)
T3 UPTAKE: 31 % (ref 22–35)
T4 TOTAL: 6.9 ug/dL (ref 4.9–10.5)
TSH: 2.18 mIU/L (ref 0.40–4.50)

## 2017-02-10 DIAGNOSIS — H43813 Vitreous degeneration, bilateral: Secondary | ICD-10-CM | POA: Diagnosis not present

## 2017-02-10 DIAGNOSIS — H5201 Hypermetropia, right eye: Secondary | ICD-10-CM | POA: Diagnosis not present

## 2017-02-10 DIAGNOSIS — Z961 Presence of intraocular lens: Secondary | ICD-10-CM | POA: Diagnosis not present

## 2017-02-10 DIAGNOSIS — E119 Type 2 diabetes mellitus without complications: Secondary | ICD-10-CM | POA: Diagnosis not present

## 2017-02-10 DIAGNOSIS — H5212 Myopia, left eye: Secondary | ICD-10-CM | POA: Diagnosis not present

## 2017-02-10 DIAGNOSIS — H52223 Regular astigmatism, bilateral: Secondary | ICD-10-CM | POA: Diagnosis not present

## 2017-02-10 DIAGNOSIS — H524 Presbyopia: Secondary | ICD-10-CM | POA: Diagnosis not present

## 2017-02-10 LAB — HM DIABETES EYE EXAM

## 2017-03-13 ENCOUNTER — Telehealth: Payer: Self-pay

## 2017-03-13 NOTE — Telephone Encounter (Signed)
Pt would like to switch from esomeprazole to omeprazole due to cost savings. Please advise if the change is appropriate.

## 2017-03-16 ENCOUNTER — Other Ambulatory Visit: Payer: Self-pay | Admitting: Internal Medicine

## 2017-03-16 DIAGNOSIS — K2100 Gastro-esophageal reflux disease with esophagitis, without bleeding: Secondary | ICD-10-CM

## 2017-03-16 MED ORDER — OMEPRAZOLE 40 MG PO CPDR: 40.0000 mg | DELAYED_RELEASE_CAPSULE | Freq: Every day | ORAL | 1 refills | Status: DC

## 2017-03-25 NOTE — Telephone Encounter (Signed)
PCP has changed per pt request.

## 2017-04-30 DIAGNOSIS — M25551 Pain in right hip: Secondary | ICD-10-CM | POA: Diagnosis not present

## 2017-05-26 DIAGNOSIS — L821 Other seborrheic keratosis: Secondary | ICD-10-CM | POA: Diagnosis not present

## 2017-05-26 DIAGNOSIS — L719 Rosacea, unspecified: Secondary | ICD-10-CM | POA: Diagnosis not present

## 2017-05-26 DIAGNOSIS — L570 Actinic keratosis: Secondary | ICD-10-CM | POA: Diagnosis not present

## 2017-05-26 DIAGNOSIS — Z85828 Personal history of other malignant neoplasm of skin: Secondary | ICD-10-CM | POA: Diagnosis not present

## 2017-06-16 ENCOUNTER — Encounter: Payer: Self-pay | Admitting: Cardiology

## 2017-06-30 ENCOUNTER — Encounter: Payer: Self-pay | Admitting: Cardiology

## 2017-06-30 ENCOUNTER — Ambulatory Visit: Payer: Medicare HMO | Admitting: Cardiology

## 2017-06-30 VITALS — BP 102/70 | HR 61 | Ht 70.0 in | Wt 185.6 lb

## 2017-06-30 DIAGNOSIS — R748 Abnormal levels of other serum enzymes: Secondary | ICD-10-CM | POA: Diagnosis not present

## 2017-06-30 DIAGNOSIS — E785 Hyperlipidemia, unspecified: Secondary | ICD-10-CM

## 2017-06-30 DIAGNOSIS — K7581 Nonalcoholic steatohepatitis (NASH): Secondary | ICD-10-CM | POA: Insufficient documentation

## 2017-06-30 DIAGNOSIS — I2510 Atherosclerotic heart disease of native coronary artery without angina pectoris: Secondary | ICD-10-CM

## 2017-06-30 DIAGNOSIS — I10 Essential (primary) hypertension: Secondary | ICD-10-CM | POA: Diagnosis not present

## 2017-06-30 NOTE — Progress Notes (Signed)
Cardiology Office Note:    Date:  06/30/2017   ID:  [name], DOB [birth year], MRN [id number]  PCP:  [name], MD  Cardiologist:  [name], MD     Referring MD: [name], MD     History of Present Illness:    Manuel [name] is a 84 y.o. male former patient of Dr. Claris Gladden with history of CAD status post myocardial infarction in January 2011 with diabetes hypertension hyperlipidemia here for follow-up.  He developed bilateral shoulder pain as angina in 2011 and was found to have MI. Heart catheterization showed 99% mid circumflex which is treated with DES. EF was normal. In [place].   Nuclear stress test in February 2015 showed no ischemia.  Overall he is doing quite well. Still walking. Golf. Occasional atypical chest discomfort.  06/30/17 - [place] bags, big, heavy. Last one heart pain. NTG sat down and went away. None since then. [place], walks 5 days a week, one mile. Still golfing. Chair exercise at church.  He does have some seasonal allergies which results in mild wheezing.  No bleeding, no syncope.  He knows to get up slowly from the bed.  Daughter with him again today.  Past Medical History:  Diagnosis Date  . Allergic rhinitis    childhood  . Asthma    Allergic component  . CAD (coronary artery disease)    s/p MI in 1/11. LHC (1/11) with 99% mCFX treated with 2.5 x 16 Xience V DES; 50% mLAD; EF 55%. ETT-myoview (10/12): 4'51", no significant ST segment changes, EF 56%, no ischemia or infarction.  . 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 Chronic kidney disease    stones  . Diabetes mellitus   . Dizziness    Holter (10/12): Frequent PACs and PVCs. No significant bradycardia.   [name] GERD (gastroesophageal reflux disease)   . Hx of cardiovascular stress test    ETT-Myoview (03/2013):  Inf defect on short axis images only (not felt to be significant), EF 68%; low risk.  [name] Hyperlipidemia   . Hypertension   . MI (myocardial infarction) ([place]) 02/2009    . Pneumonia    age 15   PMH: 1. Asthma: Allergic component 2. CAD: s/p MI in 1/11.  LHC (1/11) with 99% mCFX treated with 2.5 x 16 Xience V DES; 50% mLAD; EF 55%.  ETT-myoview (10/12): 4'51", no significant ST segment changes, EF 56%, no ischemia or infarction. ETT-Cardiolite (2/15): 4'15", probably normal with no ischemia/infarction, EF 68%.  3. GERD 4. Appendectomy 5. HTN 6. Hyperlipidemia 7. Diabetes mellitus type II 8. Lightheaded spells: Holter (10/12): Frequent PACs and PVCs.  No significant bradycardia.  Probably due primarily to BPPV.    FH: Parents lived into their 62s.  No premature CAD.    SH: Moved from [place], [place] to [place].  Married, originally from [place].  Now living in daughter's house.  Quit smoking 1990, retired Hotel manager, occasional smoker.    Past Surgical History:  Procedure Laterality Date  . APPENDECTOMY    . CORONARY STENT PLACEMENT      Current Medications: Current Meds  Medication Sig  . aspirin EC 81 MG EC tablet Take 1 tablet (81 mg total) by mouth daily.  [name] atorvastatin (LIPITOR) 40 MG tablet TAKE 1 TABLET (40 MG TOTAL) BY MOUTH DAILY.  . beclomethasone (QVAR) 40 MCG/ACT inhaler Inhale 2 puffs into the lungs 2 (two) times daily.  . Blood Glucose Monitoring Suppl (  [place]) w/Device KIT 1 Device by Does not apply route daily. Use to check blood sugars daily Dx E11.9  . glucose blood (ONETOUCH VERIO) test strip 1 each by Other route 2 (two) times daily. Use to check blood sugars twice a day Dx E11.9  . irbesartan-hydrochlorothiazide (AVALIDE) 300-12.5 MG tablet Take 1 tablet by mouth daily.  . metFORMIN (GLUCOPHAGE) 500 MG tablet TAKE 2 TABLETS (1,000 MG TOTAL) BY MOUTH 2 (TWO) TIMES DAILY.  . metoprolol tartrate (LOPRESSOR) 25 MG tablet TAKE 1/2 TABLET BY MOUTH TWICE DAILY *PT NEEDS FOLLOW UP APPT FOR MORE REFILLS**  . Multiple Vitamin (MULTIVITAMIN) tablet Take 1 tablet by mouth daily.  . nitroGLYCERIN (NITROSTAT) 0.4  MG SL tablet DISSOLVE 1 TABLET UNDER TONGUE EVERY 5 MINUTES AS NEEDED FOR CHEST PAIN FOR 3 DOSES  . omeprazole (PRILOSEC) 40 MG capsule Take 1 capsule (40 mg total) by mouth daily.  Glory Rosebush DELICA LANCETS 50K MISC Use to help check blood sugars twice a day Dx E11.9     Allergies:   Other   Social History   Socioeconomic History  . Marital status: Single    Spouse name: Not on file  . Number of children: Not on file  . Years of education: Not on file  . Highest education level: Not on file  Occupational History  . Occupation: retired  Scientific laboratory technician  . Financial resource strain: Not on file  . Food insecurity:    Worry: Not on file    Inability: Not on file  . Transportation needs:    Medical: Not on file    Non-medical: Not on file  Tobacco Use  . Smoking status: Former Smoker    Types: Cigarettes    Last attempt to quit: 06/09/1966    Years since quitting: 51.0  . Smokeless tobacco: Never Used  Substance and Sexual Activity  . Alcohol use: No    Alcohol/week: 0.0 oz  . Drug use: No  . Sexual activity: Not Currently  Lifestyle  . Physical activity:    Days per week: Not on file    Minutes per session: Not on file  . Stress: Not on file  Relationships  . Social connections:    Talks on phone: Not on file    Gets together: Not on file    Attends religious service: Not on file    Active member of club or organization: Not on file    Attends meetings of clubs or organizations: Not on file    Relationship status: Not on file  Other Topics Concern  . Not on file  Social History Narrative   Associates degree.      family history includes Asthma in his father; Diabetes in his mother; Heart attack in his father; Heart failure in his mother. There is no history of Cancer. ROS:   Please see the history of present illness.     All other systems reviewed and are negative.   EKGs/Labs/Other Studies Reviewed:    EKG:  EKG is  ordered today.  The ekg ordered today  demonstrates 06/30/2017-heart rate 61 sinus rhythm with first-degree AV block 216 ms, borderline, nonspecific ST-T wave flattening.  Personally viewed-prior 06/25/16-sinus rhythm first degree AV block PR interval 214 ms with nonspecific T-wave flattening personally viewed, left axis deviation.  Recent Labs: 02/06/2017: ALT 84; BUN 19; Creatinine, Ser 0.99; Hemoglobin 13.9; Platelets 164.0; Potassium 4.5; Sodium 138; TSH 2.18   Recent Lipid Panel    Component Value Date/Time  CHOL 145 02/06/2017 1614   TRIG 183.0 (H) 02/06/2017 1614   HDL 42.50 02/06/2017 1614   CHOLHDL 3 02/06/2017 1614   VLDL 36.6 02/06/2017 1614   LDLCALC 66 02/06/2017 1614    Physical Exam:    VS:  BP 102/70   Pulse 61   Ht 5' 10"  (1.778 m)   Wt 185 lb 9.6 oz (84.2 kg)   BMI 26.63 kg/m     Wt Readings from Last 3 Encounters:  06/30/17 185 lb 9.6 oz (84.2 kg)  02/06/17 194 lb (88 kg)  06/25/16 194 lb (88 kg)     GEN: Well nourished, well developed, in no acute distress  HEENT: normal  Neck: no JVD, carotid bruits, or masses Cardiac: RRR; no murmurs, rubs, or gallops,no edema  Respiratory:  clear to auscultation bilaterally, normal work of breathing GI: soft, nontender, nondistended, + BS MS: no deformity or atrophy  Skin: warm and dry, no rash, varicose left leg Neuro:  Alert and Oriented x 3, Strength and sensation are intact Psych: euthymic mood, full affect   ASSESSMENT:    1. Coronary artery disease without angina pectoris, unspecified vessel or lesion type, unspecified whether native or transplanted heart   2. Essential hypertension, benign   3. Hyperlipidemia with target LDL less than 100   4. Elevated liver enzymes    PLAN:    In order of problems listed above:  Coronary artery disease  - Stable, post circumflex DES 2011. Doing well, no anginal symptoms.  - Continue current medications as above. Currently his angina is well controlled.  One isolated episode of possible angina when  carrying heavy load.  None since.  Walking almost daily.  Doing very well.  Essential hypertension  - Well controlled on current meds.  No changes made.  Hyperlipidemia  - Excellent lipids, statin. No changes made. Dr. Ronnald Ramp has been following labs.  LDL 66  Elevated liver enzymes  -AST and ALT are both mildly elevated.  They have been like this for years.  In fact his daughter also has the same profile.  Since they are not 3 times the upper limit of normal, ALT, I would go ahead and continue with his statin therapy.  First-degree AV block  -Very mild, no changes.  Comfortable with continuing current low-dose metoprolol.  Excellent job with weight loss.   Medication Adjustments/Labs and Tests Ordered: Current medicines are reviewed at length with the patient today.  Concerns regarding medicines are outlined above. Labs and tests ordered and medication changes are outlined in the patient instructions below:  Patient Instructions  Medication Instructions:  Your physician recommends that you continue on your current medications as directed. Please refer to the Current Medication list given to you today.  If you need a refill on your cardiac medications, please contact your pharmacy first.  Labwork: None ordered   Testing/Procedures: None ordered   Follow-Up: Your physician wants you to follow-up in: 1 year with Dr. Marlou Porch. You will receive a reminder letter in the mail two months in advance. If you don't receive a letter, please call our office to schedule the follow-up appointment.  Any Other Special Instructions Will Be Listed Below (If Applicable).   Thank you for choosing [facility]    [phone]  If you need a refill on your cardiac medications before your next appointment, please call your pharmacy.      Signed, [name], MD  06/30/2017 9:28 AM    [facility]

## 2017-06-30 NOTE — Patient Instructions (Addendum)
Medication Instructions:  Your physician recommends that you continue on your current medications as directed. Please refer to the Current Medication list given to you today.  If you need a refill on your cardiac medications, please contact your pharmacy first.  Labwork: None ordered   Testing/Procedures: None ordered   Follow-Up: Your physician wants you to follow-up in: 1 year with Dr. Marlou Porch. You will receive a reminder letter in the mail two months in advance. If you don't receive a letter, please call our office to schedule the follow-up appointment.  Any Other Special Instructions Will Be Listed Below (If Applicable).   Thank you for choosing [facility]    [phone]  If you need a refill on your cardiac medications before your next appointment, please call your pharmacy.

## 2017-07-29 ENCOUNTER — Other Ambulatory Visit: Payer: Self-pay | Admitting: Internal Medicine

## 2017-07-29 DIAGNOSIS — E1149 Type 2 diabetes mellitus with other diabetic neurological complication: Secondary | ICD-10-CM

## 2017-08-11 ENCOUNTER — Other Ambulatory Visit (INDEPENDENT_AMBULATORY_CARE_PROVIDER_SITE_OTHER): Payer: Medicare HMO

## 2017-08-11 ENCOUNTER — Encounter: Payer: Self-pay | Admitting: Internal Medicine

## 2017-08-11 ENCOUNTER — Ambulatory Visit: Payer: Self-pay | Admitting: Internal Medicine

## 2017-08-11 ENCOUNTER — Ambulatory Visit: Payer: Medicare HMO | Admitting: Internal Medicine

## 2017-08-11 ENCOUNTER — Other Ambulatory Visit: Payer: Self-pay | Admitting: Internal Medicine

## 2017-08-11 VITALS — BP 128/70 | HR 69 | Temp 98.1°F | Resp 16 | Ht 70.0 in | Wt 185.8 lb

## 2017-08-11 DIAGNOSIS — I10 Essential (primary) hypertension: Secondary | ICD-10-CM

## 2017-08-11 DIAGNOSIS — R69 Illness, unspecified: Secondary | ICD-10-CM | POA: Diagnosis not present

## 2017-08-11 DIAGNOSIS — E1149 Type 2 diabetes mellitus with other diabetic neurological complication: Secondary | ICD-10-CM

## 2017-08-11 DIAGNOSIS — J449 Chronic obstructive pulmonary disease, unspecified: Secondary | ICD-10-CM | POA: Diagnosis not present

## 2017-08-11 LAB — CBC WITH DIFFERENTIAL/PLATELET
BASOS ABS: 0 10*3/uL (ref 0.0–0.1)
Basophils Relative: 1.1 % (ref 0.0–3.0)
EOS PCT: 6 % — AB (ref 0.0–5.0)
Eosinophils Absolute: 0.3 10*3/uL (ref 0.0–0.7)
HCT: 42.1 % (ref 39.0–52.0)
HEMOGLOBIN: 14.3 g/dL (ref 13.0–17.0)
LYMPHS ABS: 0.9 10*3/uL (ref 0.7–4.0)
Lymphocytes Relative: 20.2 % (ref 12.0–46.0)
MCHC: 34 g/dL (ref 30.0–36.0)
MCV: 94.4 fl (ref 78.0–100.0)
MONO ABS: 0.5 10*3/uL (ref 0.1–1.0)
Monocytes Relative: 11.8 % (ref 3.0–12.0)
NEUTROS PCT: 60.9 % (ref 43.0–77.0)
Neutro Abs: 2.7 10*3/uL (ref 1.4–7.7)
Platelets: 160 10*3/uL (ref 150.0–400.0)
RBC: 4.45 Mil/uL (ref 4.22–5.81)
RDW: 13.9 % (ref 11.5–15.5)
WBC: 4.4 10*3/uL (ref 4.0–10.5)

## 2017-08-11 LAB — COMPREHENSIVE METABOLIC PANEL
ALBUMIN: 4.1 g/dL (ref 3.5–5.2)
ALK PHOS: 98 U/L (ref 39–117)
ALT: 75 U/L — ABNORMAL HIGH (ref 0–53)
AST: 73 U/L — AB (ref 0–37)
BUN: 25 mg/dL — ABNORMAL HIGH (ref 6–23)
CALCIUM: 9.6 mg/dL (ref 8.4–10.5)
CHLORIDE: 98 meq/L (ref 96–112)
CO2: 32 mEq/L (ref 19–32)
CREATININE: 1.07 mg/dL (ref 0.40–1.50)
GFR: 69.44 mL/min (ref >60.00)
Glucose, Bld: 117 mg/dL — ABNORMAL HIGH (ref 70–99)
Potassium: 4.1 mEq/L (ref 3.5–5.1)
Sodium: 139 mEq/L (ref 135–145)
TOTAL PROTEIN: 7.4 g/dL (ref 6.0–8.3)
Total Bilirubin: 0.7 mg/dL (ref 0.2–1.2)

## 2017-08-11 LAB — HEMOGLOBIN A1C: HEMOGLOBIN A1C: 6.8 % — AB (ref 4.6–6.5)

## 2017-08-11 NOTE — Telephone Encounter (Signed)
Daughter Rudene Christians  checked glucose and is  90  appointment  made tommorow with Dr Ronnald Ramp by Sam  Pt will eat tonight . Lizzie notified  appt  Made with dr Ronnald Ramp for tomorrow. If pt has any blurred vision or slurred speech take the patient to er

## 2017-08-11 NOTE — Progress Notes (Signed)
Subjective:  Patient ID: Manuel [name], male    DOB: 08/19/1928  Age: 84 y.o. MRN: [id number]  CC: Hypertension and Diabetes   HPI Manuel [name] presents for f/up - He tells me his blood sugars and blood pressure have been well controlled.  He has felt well recently and offers no complaints.  Outpatient Medications Prior to Visit  Medication Sig Dispense Refill  . aspirin EC 81 MG EC tablet Take 1 tablet (81 mg total) by mouth daily.    [name] atorvastatin (LIPITOR) 40 MG tablet TAKE 1 TABLET (40 MG TOTAL) BY MOUTH DAILY. 90 tablet 1  . beclomethasone (QVAR) 40 MCG/ACT inhaler Inhale 2 puffs into the lungs 2 (two) times daily. 3 Inhaler 3  . Blood Glucose Monitoring Suppl (ONETOUCH VERIO FLEX SYSTEM) w/Device KIT 1 Device by Does not apply route daily. Use to check blood sugars daily Dx E11.9 1 kit 0  . glucose blood (ONETOUCH VERIO) test strip 1 each by Other route 2 (two) times daily. Use to check blood sugars twice a day Dx E11.9 100 each 3  . irbesartan-hydrochlorothiazide (AVALIDE) 300-12.5 MG tablet Take 1 tablet by mouth daily. 90 tablet 1  . metFORMIN (GLUCOPHAGE) 500 MG tablet TAKE 2 TABLETS BY MOUTH TWICE A DAY 120 tablet 0  . metoprolol tartrate (LOPRESSOR) 25 MG tablet TAKE 1/2 TABLET BY MOUTH TWICE DAILY *PT NEEDS FOLLOW UP APPT FOR MORE REFILLS** 90 tablet 3  . Multiple Vitamin (MULTIVITAMIN) tablet Take 1 tablet by mouth daily.    . nitroGLYCERIN (NITROSTAT) 0.4 MG SL tablet DISSOLVE 1 TABLET UNDER TONGUE EVERY 5 MINUTES AS NEEDED FOR CHEST PAIN FOR 3 DOSES 25 tablet 0  . omeprazole (PRILOSEC) 40 MG capsule Take 1 capsule (40 mg total) by mouth daily. 90 capsule 1  . ONETOUCH DELICA LANCETS 29J MISC Use to help check blood sugars twice a day Dx E11.9 100 each 3   No facility-administered medications prior to visit.     ROS Review of Systems  Constitutional: Negative.  Negative for diaphoresis and fatigue.  HENT: Negative.   Eyes: Negative.   Respiratory: Negative.  Negative for  chest tightness, shortness of breath and wheezing.   Cardiovascular: Negative for chest pain, palpitations and leg swelling.  Gastrointestinal: Negative for abdominal pain, constipation, diarrhea, nausea and vomiting.  Endocrine: Negative.  Negative for polydipsia, polyphagia and polyuria.  Genitourinary: Negative.  Negative for difficulty urinating.  Musculoskeletal: Negative.  Negative for arthralgias and myalgias.  Skin: Negative.   Neurological: Negative.  Negative for dizziness, weakness and light-headedness.  Hematological: Negative for adenopathy. Does not bruise/bleed easily.  Psychiatric/Behavioral: Negative.     Objective:  BP 128/70 (BP Location: Left Arm, Patient Position: Sitting, Cuff Size: Normal)   Pulse 69   Temp 98.1 F (36.7 C) (Oral)   Resp 16   Ht 5' 10" (1.778 m)   Wt 185 lb 12 oz (84.3 kg)   SpO2 93%   BMI 26.65 kg/m   BP Readings from Last 3 Encounters:  08/11/17 128/70  06/30/17 102/70  02/06/17 134/64    Wt Readings from Last 3 Encounters:  08/11/17 185 lb 12 oz (84.3 kg)  06/30/17 185 lb 9.6 oz (84.2 kg)  02/06/17 194 lb (88 kg)    Physical Exam  Constitutional: He is oriented to person, place, and time. No distress.  HENT:  Mouth/Throat: Oropharynx is clear and moist. No oropharyngeal exudate.  Eyes: Conjunctivae are normal. No scleral icterus.  Neck: Normal range of  motion. Neck supple. No JVD present. No thyromegaly present.  Cardiovascular: Normal rate, regular rhythm and normal heart sounds.  No murmur heard. Pulmonary/Chest: Effort normal and breath sounds normal. He has no wheezes. He has no rhonchi. He has no rales.  Abdominal: Soft. Bowel sounds are normal. He exhibits no mass. There is no hepatosplenomegaly. There is no tenderness.  Musculoskeletal: Normal range of motion. He exhibits no edema, tenderness or deformity.  Lymphadenopathy:    He has no cervical adenopathy.  Neurological: He is alert and oriented to person, place, and  time.  Skin: Skin is warm and dry. He is not diaphoretic.  Vitals reviewed.   Lab Results  Component Value Date   WBC 4.4 08/11/2017   HGB 14.3 08/11/2017   HCT 42.1 08/11/2017   PLT 160.0 08/11/2017   GLUCOSE 117 (H) 08/11/2017   CHOL 145 02/06/2017   TRIG 183.0 (H) 02/06/2017   HDL 42.50 02/06/2017   LDLCALC 66 02/06/2017   ALT 75 (H) 08/11/2017   AST 73 (H) 08/11/2017   NA 139 08/11/2017   K 4.1 08/11/2017   CL 98 08/11/2017   CREATININE 1.07 08/11/2017   BUN 25 (H) 08/11/2017   CO2 32 08/11/2017   TSH 2.18 02/06/2017   HGBA1C 6.8 (H) 08/11/2017   MICROALBUR 1.3 02/06/2017    Dg Foot Complete Right  Result Date: 05/17/2012 *RADIOLOGY REPORT* Clinical Data: Right-sided foot pain. RIGHT FOOT COMPLETE - 3+ VIEW Comparison: No priors. Findings: Three views of the right foot demonstrate no acute displaced fracture, subluxation or dislocation.  Soft tissues are unremarkable.  Dorsal calcaneal spur incidentally noted. Joint space narrowing, subchondral sclerosis and osteophyte formation are noted throughout the midfoot, and to a lesser extent at the first MTP joint, compatible with osteoarthritis. IMPRESSION: 1.  No acute radiographic abnormality of the right foot. 2.  Degenerative changes of osteoarthritis, as above. Original Report Authenticated By: Vinnie Langton, M.D.    Assessment & Plan:   Tysin was seen today for hypertension and diabetes.  Diagnoses and all orders for this visit:  COPD (chronic obstructive pulmonary disease) with chronic bronchitis ([place])- his sx's are well controlled with the ICS.  Diabetes mellitus type 2 with neurological manifestations ([place])-  His A1C is at 6.8%. His blood sugar is well controled -     Hemoglobin A1c; Future -     Comprehensive metabolic panel; Future  Essential hypertension, benign- His BP is well controlled, lytes and renal fxn are normal -     CBC with Differential/Platelet; Future -     Comprehensive metabolic panel;  Future   I am having [name] maintain his aspirin EC, multivitamin, ONETOUCH VERIO FLEX SYSTEM, glucose blood, ONETOUCH DELICA LANCETS 40J, beclomethasone, nitroGLYCERIN, irbesartan-hydrochlorothiazide, atorvastatin, metoprolol tartrate, omeprazole, and metFORMIN.  No orders of the defined types were placed in this encounter.    Follow-up: Return in about 6 months (around 02/10/2018).  Scarlette Calico, MD

## 2017-08-11 NOTE — Telephone Encounter (Signed)
Pt had a similar episode at about 630 am   Saw Dr Ronnald Ramp 8 am for OV    Pt had dizzy / lightheaded episode about 1 hour ago. Not as bad as the episode this am.Pt feels got better now. BP at this time is 140/84 and 136/88 Pt is awake and alert and alert. Daughter lizzie who is a Marine scientist is with pt  Please [phone]   Answer Assessment - Initial Assessment Questions 1. DESCRIPTION: "Describe your dizziness."       Not dizzy now- about 1 hr ago sitting in his chair felt headed for sev minutes  2. LIGHTHEADED: "Do you feel lightheaded?" (e.g., somewhat faint, woozy, weak upon standing)     Stood up felt  slightly woozy and lightheaded walked up about room for about 5 mins felt better  3. VERTIGO: "Do you feel like either you or the room is spinning or tilting?" (i.e. vertigo)       No   4. SEVERITY: "How bad is it?"  "Do you feel like you are going to faint?" "Can you stand and walk?"   - MILD - walking normally   - MODERATE - interferes with normal activities (e.g., work, school)    - SEVERE - unable to stand, requires support to walk, feels like passing out now.        Mild   5. ONSET:  "When did the dizziness begin?"        Began - had similar episode this am   6. AGGRAVATING FACTORS: "Does anything make it worse?" (e.g., standing, change in head position)          No  7. HEART RATE: "Can you tell me your heart rate?" "How many beats in 15 seconds?"  (Note: not all patients can do this)             72  8. CAUSE: "What do you think is causing the dizziness?"     No  9. RECURRENT SYMPTOM: "Have you had dizziness before?" If so, ask: "When was the last time?" "What happened that time?"       Prior to the episode this am  approx 2 years ago  Medications were adjusted at this time   10. OTHER SYMPTOMS: "Do you have any other symptoms?" (e.g., fever, chest pain, vomiting, diarrhea, bleeding)         None  11. PREGNANCY: "Is there any chance you are pregnant?" "When was your last  menstrual period?"       n/a  Protocols used: DIZZINESS [facility]

## 2017-08-11 NOTE — Patient Instructions (Signed)

## 2017-08-12 ENCOUNTER — Ambulatory Visit: Payer: Medicare HMO | Admitting: Internal Medicine

## 2017-08-12 ENCOUNTER — Encounter: Payer: Self-pay | Admitting: Internal Medicine

## 2017-08-12 VITALS — BP 124/64 | HR 66 | Temp 98.4°F | Resp 16 | Ht 70.0 in | Wt 184.5 lb

## 2017-08-12 DIAGNOSIS — R42 Dizziness and giddiness: Secondary | ICD-10-CM

## 2017-08-12 DIAGNOSIS — I2510 Atherosclerotic heart disease of native coronary artery without angina pectoris: Secondary | ICD-10-CM | POA: Diagnosis not present

## 2017-08-12 DIAGNOSIS — E1149 Type 2 diabetes mellitus with other diabetic neurological complication: Secondary | ICD-10-CM

## 2017-08-12 DIAGNOSIS — R69 Illness, unspecified: Secondary | ICD-10-CM | POA: Diagnosis not present

## 2017-08-12 DIAGNOSIS — I10 Essential (primary) hypertension: Secondary | ICD-10-CM | POA: Diagnosis not present

## 2017-08-12 MED ORDER — GLUCOSE BLOOD VI STRP: ORAL_STRIP | 3 refills | Status: DC

## 2017-08-12 MED ORDER — ONETOUCH DELICA LANCETS 33G MISC: 3 refills | Status: DC

## 2017-08-12 NOTE — Progress Notes (Signed)
Subjective:  Patient ID: Manuel [name], male    DOB: 1928-05-27  Age: 84 y.o. MRN: [id number]  CC: Dizziness   HPI Manuel [name] presents for f/up - He was seen 1 day prior to this visit and was doing well.  Later in the day, yesterday, he developed a few episodes of dizziness and lightheadedness.  He thinks it is related to being in a fasting state for labs as well as not drinking much liquids.  His daughter has been checking his blood sugar and his blood pressure and they have both been okay.  He has slowly started feeling better after he drank liquids and ate some food.  Outpatient Medications Prior to Visit  Medication Sig Dispense Refill  . aspirin EC 81 MG EC tablet Take 1 tablet (81 mg total) by mouth daily.    [name] atorvastatin (LIPITOR) 40 MG tablet TAKE 1 TABLET (40 MG TOTAL) BY MOUTH DAILY. 90 tablet 1  . beclomethasone (QVAR) 40 MCG/ACT inhaler Inhale 2 puffs into the lungs 2 (two) times daily. 3 Inhaler 3  . Blood Glucose Monitoring Suppl (ONETOUCH VERIO FLEX SYSTEM) w/Device KIT 1 Device by Does not apply route daily. Use to check blood sugars daily Dx E11.9 1 kit 0  . irbesartan-hydrochlorothiazide (AVALIDE) 300-12.5 MG tablet Take 1 tablet by mouth daily. 90 tablet 1  . metFORMIN (GLUCOPHAGE) 500 MG tablet TAKE 2 TABLETS BY MOUTH TWICE A DAY 120 tablet 0  . metoprolol tartrate (LOPRESSOR) 25 MG tablet TAKE 1/2 TABLET BY MOUTH TWICE DAILY *PT NEEDS FOLLOW UP APPT FOR MORE REFILLS** 90 tablet 3  . Multiple Vitamin (MULTIVITAMIN) tablet Take 1 tablet by mouth daily.    . nitroGLYCERIN (NITROSTAT) 0.4 MG SL tablet DISSOLVE 1 TABLET UNDER TONGUE EVERY 5 MINUTES AS NEEDED FOR CHEST PAIN FOR 3 DOSES 25 tablet 0  . omeprazole (PRILOSEC) 40 MG capsule Take 1 capsule (40 mg total) by mouth daily. 90 capsule 1  . ONETOUCH DELICA LANCETS 78G MISC Use to help check blood sugars twice a day Dx E11.9 100 each 3  . ONETOUCH VERIO test strip USE TO CHECK BLOOD SUGARS TWICE A DAY 100 each 3   No  facility-administered medications prior to visit.     ROS Review of Systems  Constitutional: Negative for diaphoresis, fatigue and unexpected weight change.  HENT: Negative.   Eyes: Negative for visual disturbance.  Respiratory: Negative for cough, chest tightness, shortness of breath and wheezing.   Cardiovascular: Negative for chest pain, palpitations and leg swelling.  Gastrointestinal: Negative for abdominal pain, diarrhea, nausea and vomiting.  Genitourinary: Negative.  Negative for difficulty urinating and dysuria.  Musculoskeletal: Negative for arthralgias, back pain, myalgias and neck pain.  Skin: Negative for color change and pallor.  Neurological: Positive for dizziness and light-headedness. Negative for tremors, facial asymmetry, weakness, numbness and headaches.  Hematological: Negative for adenopathy. Does not bruise/bleed easily.  Psychiatric/Behavioral: Negative.     Objective:  BP 124/64 (BP Location: Left Arm, Patient Position: Sitting, Cuff Size: Normal)   Pulse 66   Temp 98.4 F (36.9 C) (Oral)   Resp 16   Ht _0  (1.778 m)   Wt 184 lb 8 oz (83.7 kg)   SpO2 93%   BMI 26.47 kg/m   BP Readings from Last 3 Encounters:  08/12/17 124/64  08/11/17 128/70  06/30/17 102/70    Wt Readings from Last 3 Encounters:  08/12/17 184 lb 8 oz (83.7 kg)  08/11/17 185 lb 12 oz (  84.3 kg)  06/30/17 185 lb 9.6 oz (84.2 kg)    Physical Exam  Constitutional: He is oriented to person, place, and time. No distress.  HENT:  Mouth/Throat: Oropharynx is clear and moist. No oropharyngeal exudate.  Eyes: Conjunctivae are normal. No scleral icterus.  Neck: Normal range of motion. Neck supple. No JVD present. No thyromegaly present.  Cardiovascular: Normal rate, regular rhythm and normal heart sounds. Exam reveals no gallop.  No murmur heard. EKG ---  Sinus  Bradycardia  Low voltage in limb leads.   -  Diffuse nonspecific T-abnormality.   ABNORMAL - no change from the  prior EKG  Pulmonary/Chest: Effort normal and breath sounds normal. No respiratory distress. He has no wheezes. He has no rales.  Abdominal: Soft. Bowel sounds are normal. He exhibits no mass. There is no hepatosplenomegaly. There is no tenderness.  Musculoskeletal: Normal range of motion. He exhibits no edema, tenderness or deformity.  Lymphadenopathy:    He has no cervical adenopathy.  Neurological: He is alert and oriented to person, place, and time.  Skin: Skin is warm and dry. No rash noted. He is not diaphoretic.  Psychiatric: He has a normal mood and affect. His behavior is normal. Judgment and thought content normal.  Vitals reviewed.   Lab Results  Component Value Date   WBC 4.4 08/11/2017   HGB 14.3 08/11/2017   HCT 42.1 08/11/2017   PLT 160.0 08/11/2017   GLUCOSE 117 (H) 08/11/2017   CHOL 145 02/06/2017   TRIG 183.0 (H) 02/06/2017   HDL 42.50 02/06/2017   LDLCALC 66 02/06/2017   ALT 75 (H) 08/11/2017   AST 73 (H) 08/11/2017   NA 139 08/11/2017   K 4.1 08/11/2017   CL 98 08/11/2017   CREATININE 1.07 08/11/2017   BUN 25 (H) 08/11/2017   CO2 32 08/11/2017   TSH 2.18 02/06/2017   HGBA1C 6.8 (H) 08/11/2017   MICROALBUR 1.3 02/06/2017    Dg Foot Complete Right  Result Date: 05/17/2012 *RADIOLOGY REPORT* Clinical Data: Right-sided foot pain. RIGHT FOOT COMPLETE - 3+ VIEW Comparison: No priors. Findings: Three views of the right foot demonstrate no acute displaced fracture, subluxation or dislocation.  Soft tissues are unremarkable.  Dorsal calcaneal spur incidentally noted. Joint space narrowing, subchondral sclerosis and osteophyte formation are noted throughout the midfoot, and to a lesser extent at the first MTP joint, compatible with osteoarthritis. IMPRESSION: 1.  No acute radiographic abnormality of the right foot. 2.  Degenerative changes of osteoarthritis, as above. Original Report Authenticated By: Vinnie Langton, M.D.    Assessment & Plan:   Manuel [name] was seen  today for dizziness.  Diagnoses and all orders for this visit:  Light headed- He has benign sounding symptoms that are quickly improving.  His exam is normal.  His orthostatic vital signs are normal.  His EKG is normal.  His lab work one day prior to this was normal.  I think this is a self-limited issue and have offered reassurance.  His daughter will continue to encourage adequate hydration and intake of calories. -     EKG 12-Lead  Diabetes mellitus type 2 with neurological manifestations ([place])- His blood sugars are well controlled -     ONETOUCH DELICA LANCETS 38B MISC; Use to help check blood sugars twice a day Dx E11.9 -     glucose blood (ONETOUCH VERIO) test strip; USE TO CHECK BLOOD SUGARS TWICE A DAY  Essential hypertension, benign- His blood pressure is adequately well controlled.  Atherosclerosis of  native coronary artery of native heart without angina pectoris- His EKG is unchanged.  I do not think his symptoms are related to ischemia.  He will let me know if he develops any chest pain or shortness of breath.   I have changed Franko H. Choma's ONETOUCH VERIO to glucose blood. I am also having him maintain his aspirin EC, multivitamin, ONETOUCH VERIO FLEX SYSTEM, beclomethasone, nitroGLYCERIN, irbesartan-hydrochlorothiazide, atorvastatin, metoprolol tartrate, omeprazole, metFORMIN, and ONETOUCH DELICA LANCETS 97K.  Meds ordered this encounter  Medications  . ONETOUCH DELICA LANCETS 82S MISC    Sig: Use to help check blood sugars twice a day Dx E11.9    Dispense:  100 each    Refill:  3  . glucose blood (ONETOUCH VERIO) test strip    Sig: USE TO CHECK BLOOD SUGARS TWICE A DAY    Dispense:  100 each    Refill:  3     Follow-up: No follow-ups on file.  Scarlette Calico, MD

## 2017-08-13 ENCOUNTER — Encounter: Payer: Self-pay | Admitting: Internal Medicine

## 2017-08-13 NOTE — Patient Instructions (Signed)

## 2017-09-01 ENCOUNTER — Other Ambulatory Visit: Payer: Self-pay | Admitting: Internal Medicine

## 2017-09-01 DIAGNOSIS — E1149 Type 2 diabetes mellitus with other diabetic neurological complication: Secondary | ICD-10-CM

## 2017-09-01 DIAGNOSIS — I10 Essential (primary) hypertension: Secondary | ICD-10-CM

## 2017-09-27 DIAGNOSIS — R69 Illness, unspecified: Secondary | ICD-10-CM | POA: Diagnosis not present

## 2017-10-06 ENCOUNTER — Encounter: Payer: Self-pay | Admitting: Internal Medicine

## 2017-10-21 ENCOUNTER — Encounter (INDEPENDENT_AMBULATORY_CARE_PROVIDER_SITE_OTHER): Payer: Medicare Other | Admitting: Ophthalmology

## 2017-10-22 ENCOUNTER — Encounter (INDEPENDENT_AMBULATORY_CARE_PROVIDER_SITE_OTHER): Payer: Medicare HMO | Admitting: Ophthalmology

## 2017-10-22 DIAGNOSIS — E113293 Type 2 diabetes mellitus with mild nonproliferative diabetic retinopathy without macular edema, bilateral: Secondary | ICD-10-CM

## 2017-10-22 DIAGNOSIS — H01005 Unspecified blepharitis left lower eyelid: Secondary | ICD-10-CM

## 2017-10-22 DIAGNOSIS — H33303 Unspecified retinal break, bilateral: Secondary | ICD-10-CM

## 2017-10-22 DIAGNOSIS — E11319 Type 2 diabetes mellitus with unspecified diabetic retinopathy without macular edema: Secondary | ICD-10-CM

## 2017-10-22 DIAGNOSIS — H01004 Unspecified blepharitis left upper eyelid: Secondary | ICD-10-CM | POA: Diagnosis not present

## 2017-10-22 DIAGNOSIS — H43813 Vitreous degeneration, bilateral: Secondary | ICD-10-CM

## 2017-10-22 DIAGNOSIS — H01002 Unspecified blepharitis right lower eyelid: Secondary | ICD-10-CM | POA: Diagnosis not present

## 2017-10-22 DIAGNOSIS — H01001 Unspecified blepharitis right upper eyelid: Secondary | ICD-10-CM | POA: Diagnosis not present

## 2017-10-22 LAB — HM DIABETES EYE EXAM

## 2017-11-03 ENCOUNTER — Encounter: Payer: Self-pay | Admitting: Internal Medicine

## 2017-11-04 DIAGNOSIS — H524 Presbyopia: Secondary | ICD-10-CM | POA: Diagnosis not present

## 2017-11-04 DIAGNOSIS — H04123 Dry eye syndrome of bilateral lacrimal glands: Secondary | ICD-10-CM | POA: Diagnosis not present

## 2017-11-04 DIAGNOSIS — H43813 Vitreous degeneration, bilateral: Secondary | ICD-10-CM | POA: Diagnosis not present

## 2017-11-04 DIAGNOSIS — H52223 Regular astigmatism, bilateral: Secondary | ICD-10-CM | POA: Diagnosis not present

## 2017-11-04 DIAGNOSIS — H5203 Hypermetropia, bilateral: Secondary | ICD-10-CM | POA: Diagnosis not present

## 2017-11-04 DIAGNOSIS — H1045 Other chronic allergic conjunctivitis: Secondary | ICD-10-CM | POA: Diagnosis not present

## 2017-11-17 DIAGNOSIS — L570 Actinic keratosis: Secondary | ICD-10-CM | POA: Diagnosis not present

## 2017-11-17 DIAGNOSIS — B078 Other viral warts: Secondary | ICD-10-CM | POA: Diagnosis not present

## 2017-12-01 ENCOUNTER — Other Ambulatory Visit: Payer: Self-pay | Admitting: Internal Medicine

## 2017-12-01 ENCOUNTER — Telehealth: Payer: Self-pay | Admitting: Internal Medicine

## 2017-12-01 DIAGNOSIS — K2100 Gastro-esophageal reflux disease with esophagitis, without bleeding: Secondary | ICD-10-CM

## 2017-12-01 DIAGNOSIS — E1149 Type 2 diabetes mellitus with other diabetic neurological complication: Secondary | ICD-10-CM

## 2017-12-01 MED ORDER — OMEPRAZOLE 40 MG PO CPDR: 40.0000 mg | DELAYED_RELEASE_CAPSULE | Freq: Every day | ORAL | 1 refills | Status: DC

## 2017-12-01 MED ORDER — METFORMIN HCL 500 MG PO TABS: ORAL_TABLET | ORAL | 0 refills | Status: DC

## 2017-12-01 NOTE — Telephone Encounter (Signed)
Copied from [place] [id number]. Topic: General - Other >> Dec 01, 2017  1:36 PM Janace Aris A wrote: Medication:  metFORMIN (GLUCOPHAGE) 500 MG tablet   Has the patient contacted their pharmacy? Yes   Preferred Pharmacy (with phone number or street name):  [id number] [phone](Phone) [phone](Fax)    Agent: Please be advised that RX refills may take up to 3 business days. We ask that you follow-up with your pharmacy.

## 2017-12-01 NOTE — Telephone Encounter (Signed)
Pts daughter informed that Bar[facility] was discontinued in January due to cost and changed to [facility], patient states he was not aware, patient never picked up RX in January, can [facility] please be resent to pharmacy.

## 2017-12-01 NOTE — Telephone Encounter (Signed)
Copied from [place] [phone]. Topic: General - Other >> Dec 01, 2017  1:41 PM Janace Aris A wrote: Reason for CRM: Patient's daugher called in wanting to have some RX refills done for the patient. One of the medications has been discontinued by PCP. "esomeprazole (NEXIUM) 40 MG capsule [[id number]]  DISCONTINUED "   Is this medication okay to Refill?

## 2017-12-01 NOTE — Telephone Encounter (Signed)
erx has been resent.  

## 2017-12-06 ENCOUNTER — Other Ambulatory Visit: Payer: Self-pay | Admitting: Internal Medicine

## 2017-12-06 DIAGNOSIS — R69 Illness, unspecified: Secondary | ICD-10-CM | POA: Diagnosis not present

## 2017-12-06 DIAGNOSIS — E1149 Type 2 diabetes mellitus with other diabetic neurological complication: Secondary | ICD-10-CM

## 2017-12-08 ENCOUNTER — Other Ambulatory Visit: Payer: Self-pay | Admitting: Internal Medicine

## 2017-12-08 DIAGNOSIS — E785 Hyperlipidemia, unspecified: Secondary | ICD-10-CM

## 2017-12-16 ENCOUNTER — Other Ambulatory Visit: Payer: Self-pay | Admitting: Internal Medicine

## 2017-12-16 DIAGNOSIS — K219 Gastro-esophageal reflux disease without esophagitis: Secondary | ICD-10-CM

## 2017-12-26 ENCOUNTER — Other Ambulatory Visit: Payer: Self-pay | Admitting: Internal Medicine

## 2017-12-26 DIAGNOSIS — E1149 Type 2 diabetes mellitus with other diabetic neurological complication: Secondary | ICD-10-CM

## 2018-01-01 ENCOUNTER — Other Ambulatory Visit: Payer: Self-pay | Admitting: Internal Medicine

## 2018-01-01 DIAGNOSIS — E1149 Type 2 diabetes mellitus with other diabetic neurological complication: Secondary | ICD-10-CM

## 2018-01-01 MED ORDER — METFORMIN HCL 500 MG PO TABS: 1000.0000 mg | ORAL_TABLET | Freq: Two times a day (BID) | ORAL | 0 refills | Status: DC

## 2018-01-01 NOTE — Telephone Encounter (Signed)
Copied from [place] [phone]. Topic: General - Other >> Jan 01, 2018 11:30 AM Oneta Rack wrote:  Relation to pt: Buster, Schueller (daughter) Call back number: [phone] [id number] [phone](Phone) [phone](Fax)  Reason for call:  Daughter would like a standard 3 month supply for metFORMIN (GLUCOPHAGE) 500 MG tablet, stating patient take 4 pills a day and she's having to call every month.  Patient has a 6 month follow up 02/09/18 with PCP. Informed Daughter please allow 48 to 72 hour turn around time.

## 2018-01-01 NOTE — Telephone Encounter (Signed)
90 day supply- pt takes 4 pills every day Requested Prescriptions  Pending Prescriptions Disp Refills  . metFORMIN (GLUCOPHAGE) 500 MG tablet 360 tablet 0    Sig: Take 2 tablets (1,000 mg total) by mouth 2 (two) times daily.     Endocrinology:  Diabetes - Biguanides Passed - 01/01/2018 11:55 AM      Passed - Cr in normal range and within 360 days    Creatinine, Ser  Date Value Ref Range Status  08/11/2017 1.07 0.40 - 1.50 mg/dL Final         Passed - HBA1C is between 0 and 7.9 and within 180 days    Hgb A1c MFr Bld  Date Value Ref Range Status  08/11/2017 6.8 (H) 4.6 - 6.5 % Final    Comment:    Glycemic Control Guidelines for People with Diabetes:Non Diabetic:  <6%Goal of Therapy: <7%Additional Action Suggested:  >8%          Passed - eGFR in normal range and within 360 days    GFR  Date Value Ref Range Status  08/11/2017 69.44 >60.00 mL/min Final         Passed - Valid encounter within last 6 months    Recent Outpatient Visits          4 months ago Light headed   [place], MD   4 months ago COPD (chronic obstructive pulmonary disease) with chronic bronchitis ([place])   [place], MD   10 months ago Diabetes mellitus type 2 with neurological manifestations [facility])   [place], Thomas L, MD   1 year ago Essential hypertension, benign   [place], Thomas L, MD   1 year ago Hematoma of leg, left, initial encounter   [place], Charlene Brooke, NP      Future Appointments            In 1 month Ronnald Ramp, Arvid Right, MD [place], [facility]

## 2018-01-15 ENCOUNTER — Other Ambulatory Visit: Payer: Self-pay | Admitting: Internal Medicine

## 2018-01-15 DIAGNOSIS — E1149 Type 2 diabetes mellitus with other diabetic neurological complication: Secondary | ICD-10-CM

## 2018-02-02 DIAGNOSIS — Z23 Encounter for immunization: Secondary | ICD-10-CM | POA: Diagnosis not present

## 2018-02-02 DIAGNOSIS — L570 Actinic keratosis: Secondary | ICD-10-CM | POA: Diagnosis not present

## 2018-02-02 DIAGNOSIS — L821 Other seborrheic keratosis: Secondary | ICD-10-CM | POA: Diagnosis not present

## 2018-02-02 DIAGNOSIS — L905 Scar conditions and fibrosis of skin: Secondary | ICD-10-CM | POA: Diagnosis not present

## 2018-02-02 DIAGNOSIS — L814 Other melanin hyperpigmentation: Secondary | ICD-10-CM | POA: Diagnosis not present

## 2018-02-09 ENCOUNTER — Encounter: Payer: Self-pay | Admitting: Internal Medicine

## 2018-02-09 ENCOUNTER — Other Ambulatory Visit (INDEPENDENT_AMBULATORY_CARE_PROVIDER_SITE_OTHER): Payer: Medicare HMO

## 2018-02-09 ENCOUNTER — Ambulatory Visit: Payer: Medicare HMO | Admitting: Internal Medicine

## 2018-02-09 VITALS — BP 144/78 | HR 67 | Temp 98.0°F | Ht 70.0 in | Wt 182.5 lb

## 2018-02-09 DIAGNOSIS — K2100 Gastro-esophageal reflux disease with esophagitis, without bleeding: Secondary | ICD-10-CM

## 2018-02-09 DIAGNOSIS — E1149 Type 2 diabetes mellitus with other diabetic neurological complication: Secondary | ICD-10-CM

## 2018-02-09 DIAGNOSIS — Z0001 Encounter for general adult medical examination with abnormal findings: Secondary | ICD-10-CM | POA: Diagnosis not present

## 2018-02-09 DIAGNOSIS — I10 Essential (primary) hypertension: Secondary | ICD-10-CM

## 2018-02-09 DIAGNOSIS — E785 Hyperlipidemia, unspecified: Secondary | ICD-10-CM | POA: Diagnosis not present

## 2018-02-09 DIAGNOSIS — Z0000 Encounter for general adult medical examination without abnormal findings: Secondary | ICD-10-CM

## 2018-02-09 DIAGNOSIS — R748 Abnormal levels of other serum enzymes: Secondary | ICD-10-CM

## 2018-02-09 DIAGNOSIS — I2510 Atherosclerotic heart disease of native coronary artery without angina pectoris: Secondary | ICD-10-CM | POA: Diagnosis not present

## 2018-02-09 DIAGNOSIS — J4530 Mild persistent asthma, uncomplicated: Secondary | ICD-10-CM | POA: Diagnosis not present

## 2018-02-09 LAB — COMPREHENSIVE METABOLIC PANEL
ALT: 57 U/L — ABNORMAL HIGH (ref 0–53)
AST: 49 U/L — ABNORMAL HIGH (ref 0–37)
Albumin: 4.2 g/dL (ref 3.5–5.2)
Alkaline Phosphatase: 99 U/L (ref 39–117)
BUN: 25 mg/dL — ABNORMAL HIGH (ref 6–23)
CO2: 33 mEq/L — ABNORMAL HIGH (ref 19–32)
Calcium: 10.1 mg/dL (ref 8.4–10.5)
Chloride: 97 mEq/L (ref 96–112)
Creatinine, Ser: 0.95 mg/dL (ref 0.40–1.50)
GFR: 79.56 mL/min (ref >60.00)
Glucose, Bld: 104 mg/dL — ABNORMAL HIGH (ref 70–99)
POTASSIUM: 4.8 meq/L (ref 3.5–5.1)
Sodium: 139 mEq/L (ref 135–145)
Total Bilirubin: 0.8 mg/dL (ref 0.2–1.2)
Total Protein: 7.4 g/dL (ref 6.0–8.3)

## 2018-02-09 LAB — URINALYSIS, ROUTINE W REFLEX MICROSCOPIC
Bilirubin Urine: NEGATIVE
Hgb urine dipstick: NEGATIVE
Ketones, ur: NEGATIVE
Leukocytes, UA: NEGATIVE
Nitrite: NEGATIVE
RBC / HPF: NONE SEEN (ref 0–?)
Specific Gravity, Urine: 1.015 (ref 1.000–1.030)
Total Protein, Urine: NEGATIVE
Urine Glucose: NEGATIVE
Urobilinogen, UA: 1 (ref 0.0–1.0)
pH: 6.5 (ref 5.0–8.0)

## 2018-02-09 LAB — CBC WITH DIFFERENTIAL/PLATELET
Basophils Absolute: 0.1 10*3/uL (ref 0.0–0.1)
Basophils Relative: 1.9 % (ref 0.0–3.0)
EOS ABS: 0.3 10*3/uL (ref 0.0–0.7)
Eosinophils Relative: 7.2 % — ABNORMAL HIGH (ref 0.0–5.0)
HCT: 42.7 % (ref 39.0–52.0)
Hemoglobin: 14.1 g/dL (ref 13.0–17.0)
Lymphocytes Relative: 28 % (ref 12.0–46.0)
Lymphs Abs: 1.3 10*3/uL (ref 0.7–4.0)
MCHC: 33.1 g/dL (ref 30.0–36.0)
MCV: 94.8 fl (ref 78.0–100.0)
MONO ABS: 0.5 10*3/uL (ref 0.1–1.0)
Monocytes Relative: 10.2 % (ref 3.0–12.0)
NEUTROS PCT: 52.7 % (ref 43.0–77.0)
Neutro Abs: 2.4 10*3/uL (ref 1.4–7.7)
Platelets: 160 10*3/uL (ref 150.0–400.0)
RBC: 4.5 Mil/uL (ref 4.22–5.81)
RDW: 13.6 % (ref 11.5–15.5)
WBC: 4.5 10*3/uL (ref 4.0–10.5)

## 2018-02-09 LAB — MICROALBUMIN / CREATININE URINE RATIO
Creatinine,U: 91.6 mg/dL
MICROALB UR: 1 mg/dL (ref 0.0–1.9)
Microalb Creat Ratio: 1.1 mg/g (ref 0.0–30.0)

## 2018-02-09 LAB — LIPID PANEL
Cholesterol: 158 mg/dL (ref 0–200)
HDL: 47.2 mg/dL (ref >39.00)
LDL Cholesterol: 87 mg/dL (ref 0–99)
NonHDL: 110.31
Total CHOL/HDL Ratio: 3
Triglycerides: 117 mg/dL (ref 0.0–149.0)
VLDL: 23.4 mg/dL (ref 0.0–40.0)

## 2018-02-09 LAB — TSH: TSH: 2.23 u[IU]/mL (ref 0.35–4.50)

## 2018-02-09 LAB — HEMOGLOBIN A1C: Hgb A1c MFr Bld: 6.6 % — ABNORMAL HIGH (ref 4.6–6.5)

## 2018-02-09 MED ORDER — NITROGLYCERIN 0.4 MG SL SUBL: 0.4000 mg | SUBLINGUAL_TABLET | SUBLINGUAL | 3 refills | Status: DC | PRN

## 2018-02-09 MED ORDER — IRBESARTAN-HYDROCHLOROTHIAZIDE 300-12.5 MG PO TABS: 1.0000 | ORAL_TABLET | Freq: Every day | ORAL | 1 refills | Status: DC

## 2018-02-09 MED ORDER — BECLOMETHASONE DIPROPIONATE 40 MCG/ACT IN AERS: 2.0000 | INHALATION_SPRAY | Freq: Two times a day (BID) | RESPIRATORY_TRACT | 1 refills | Status: DC

## 2018-02-09 MED ORDER — METOPROLOL TARTRATE 25 MG PO TABS: ORAL_TABLET | ORAL | 1 refills | Status: DC

## 2018-02-09 MED ORDER — METFORMIN HCL 500 MG PO TABS: 1000.0000 mg | ORAL_TABLET | Freq: Two times a day (BID) | ORAL | 1 refills | Status: DC

## 2018-02-09 NOTE — Progress Notes (Signed)
Subjective:  Patient ID: Manuel [name], male    DOB: 06/01/1928  Age: 84 y.o. MRN: [id number]  CC: Diabetes and Annual Exam   HPI COLTEN DESROCHES presents for a CPX.  He has felt well recently.  He offers no complaints today.  He needs a new supply of nitroglycerin because he destroyed his supply in the washing machine.  He is active and denies any recent episodes of CP, DOE, diaphoresis, edema, or fatigue.  He rarely wheezes.  Past Medical History:  Diagnosis Date  . Allergic rhinitis    childhood  . Asthma    Allergic component  . CAD (coronary artery disease)    s/p MI in 1/11. LHC (1/11) with 99% mCFX treated with 2.5 x 16 Xience V DES; 50% mLAD; EF 55%. ETT-myoview (10/12): 4'51", no significant ST segment changes, EF 56%, no ischemia or infarction.  . 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 Chronic kidney disease    stones  . Diabetes mellitus   . Dizziness    Holter (10/12): Frequent PACs and PVCs. No significant bradycardia.   [name] GERD (gastroesophageal reflux disease)   . Hx of cardiovascular stress test    ETT-Myoview (03/2013):  Inf defect on short axis images only (not felt to be significant), EF 68%; low risk.  [name] Hyperlipidemia   . Hypertension   . MI (myocardial infarction) ([place]) 02/2009  . Pneumonia    age 69   Past Surgical History:  Procedure Laterality Date  . APPENDECTOMY    . CORONARY STENT PLACEMENT      reports that he quit smoking about 51 years ago. His smoking use included cigarettes. He has never used smokeless tobacco. He reports that he does not drink alcohol or use drugs. family history includes Asthma in his father; Diabetes in his mother; Heart attack in his father; Heart failure in his mother. Allergies  Allergen Reactions  . Other     Cats; dust; environmental-itchy eyes, throat irritation    Outpatient Medications Prior to Visit  Medication Sig Dispense Refill  . aspirin EC 81 MG EC tablet Take 1 tablet (81 mg total) by mouth daily.    [name]  atorvastatin (LIPITOR) 40 MG tablet TAKE 1 TABLET BY MOUTH EVERY DAY 90 tablet 1  . Blood Glucose Monitoring Suppl (ONETOUCH VERIO FLEX SYSTEM) w/Device KIT 1 Device by Does not apply route daily. Use to check blood sugars daily Dx E11.9 1 kit 0  . glucose blood (ONETOUCH VERIO) test strip USE TO CHECK BLOOD SUGARS TWICE A DAY 100 each 3  . Multiple Vitamin (MULTIVITAMIN) tablet Take 1 tablet by mouth daily.    [name] omeprazole (PRILOSEC) 40 MG capsule Take 1 capsule (40 mg total) by mouth daily. 90 capsule 1  . ONETOUCH DELICA LANCETS 81X MISC Use to help check blood sugars twice a day Dx E11.9 100 each 3  . beclomethasone (QVAR) 40 MCG/ACT inhaler Inhale 2 puffs into the lungs 2 (two) times daily. 3 Inhaler 3  . irbesartan-hydrochlorothiazide (AVALIDE) 300-12.5 MG tablet TAKE 1 TABLET BY MOUTH EVERY DAY 90 tablet 1  . metFORMIN (GLUCOPHAGE) 500 MG tablet Take 2 tablets (1,000 mg total) by mouth 2 (two) times daily. 360 tablet 0  . metoprolol tartrate (LOPRESSOR) 25 MG tablet TAKE 1/2 TABLET BY MOUTH TWICE DAILY *PT NEEDS FOLLOW UP APPT FOR MORE REFILLS** 90 tablet 3  . nitroGLYCERIN (NITROSTAT) 0.4 MG SL tablet DISSOLVE 1 TABLET UNDER TONGUE EVERY 5 MINUTES  AS NEEDED FOR CHEST PAIN FOR 3 DOSES 25 tablet 0  . esomeprazole (NEXIUM) 40 MG capsule TAKE 1 CAPSULE BY MOUTH EVERY DAY 90 capsule 1  . metFORMIN (GLUCOPHAGE) 500 MG tablet TAKE 2 TABLETS BY MOUTH TWICE A DAY 360 tablet 0  . metFORMIN (GLUCOPHAGE) 500 MG tablet TAKE 2 TABLETS BY MOUTH TWICE A DAY 90 tablet 0   No facility-administered medications prior to visit.     ROS Review of Systems  Constitutional: Negative.  Negative for diaphoresis and fatigue.  HENT: Negative.   Eyes: Negative for visual disturbance.  Respiratory: Negative for cough, chest tightness, shortness of breath and wheezing.   Cardiovascular: Negative for chest pain, palpitations and leg swelling.  Gastrointestinal: Negative for abdominal pain, constipation, diarrhea and  nausea.  Endocrine: Negative.  Negative for polydipsia, polyphagia and polyuria.  Genitourinary: Negative.  Negative for difficulty urinating, scrotal swelling and testicular pain.  Musculoskeletal: Negative.  Negative for arthralgias and myalgias.  Skin: Negative.   Neurological: Negative for dizziness, weakness and light-headedness.  Hematological: Negative for adenopathy. Does not bruise/bleed easily.  Psychiatric/Behavioral: Negative.     Objective:  BP (!) 144/78 (BP Location: Left Arm, Patient Position: Sitting, Cuff Size: Normal)   Pulse 67   Temp 98 F (36.7 C) (Oral)   Ht '5\' 10"'  (1.778 m)   Wt 182 lb 8 oz (82.8 kg)   SpO2 91%   BMI 26.19 kg/m   BP Readings from Last 3 Encounters:  02/09/18 (!) 144/78  08/12/17 124/64  08/11/17 128/70    Wt Readings from Last 3 Encounters:  02/09/18 182 lb 8 oz (82.8 kg)  08/12/17 184 lb 8 oz (83.7 kg)  08/11/17 185 lb 12 oz (84.3 kg)    Physical Exam Vitals signs reviewed.  Constitutional:      Appearance: He is normal weight. He is not ill-appearing.  HENT:     Nose: Nose normal.     Mouth/Throat:     Mouth: Mucous membranes are moist.     Pharynx: No posterior oropharyngeal erythema.  Eyes:     Conjunctiva/sclera: Conjunctivae normal.  Neck:     Musculoskeletal: Normal range of motion and neck supple.  Cardiovascular:     Rate and Rhythm: Normal rate and regular rhythm.     Pulses: Normal pulses.     Heart sounds: Normal heart sounds. No murmur. No gallop.   Pulmonary:     Effort: Pulmonary effort is normal.     Breath sounds: Normal breath sounds. No wheezing, rhonchi or rales.  Abdominal:     General: Abdomen is flat. Bowel sounds are normal.     Palpations: There is no hepatomegaly, splenomegaly or mass.     Tenderness: There is no abdominal tenderness.     Hernia: No hernia is present.  Musculoskeletal: Normal range of motion.        General: No tenderness.     Right lower leg: No edema.     Left lower leg:  No edema.  Skin:    General: Skin is warm and dry.  Neurological:     General: No focal deficit present.     Mental Status: He is alert. Mental status is at baseline.  Psychiatric:        Mood and Affect: Mood normal.        Behavior: Behavior normal.        Thought Content: Thought content normal.        Judgment: Judgment normal.  Lab Results  Component Value Date   WBC 4.5 02/09/2018   HGB 14.1 02/09/2018   HCT 42.7 02/09/2018   PLT 160.0 02/09/2018   GLUCOSE 104 (H) 02/09/2018   CHOL 158 02/09/2018   TRIG 117.0 02/09/2018   HDL 47.20 02/09/2018   LDLCALC 87 02/09/2018   ALT 57 (H) 02/09/2018   AST 49 (H) 02/09/2018   NA 139 02/09/2018   K 4.8 02/09/2018   CL 97 02/09/2018   CREATININE 0.95 02/09/2018   BUN 25 (H) 02/09/2018   CO2 33 (H) 02/09/2018   TSH 2.23 02/09/2018   HGBA1C 6.6 (H) 02/09/2018   MICROALBUR 1.0 02/09/2018    Dg Foot Complete Right  Result Date: 05/17/2012 *RADIOLOGY REPORT* Clinical Data: Right-sided foot pain. RIGHT FOOT COMPLETE - 3+ VIEW Comparison: No priors. Findings: Three views of the right foot demonstrate no acute displaced fracture, subluxation or dislocation.  Soft tissues are unremarkable.  Dorsal calcaneal spur incidentally noted. Joint space narrowing, subchondral sclerosis and osteophyte formation are noted throughout the midfoot, and to a lesser extent at the first MTP joint, compatible with osteoarthritis. IMPRESSION: 1.  No acute radiographic abnormality of the right foot. 2.  Degenerative changes of osteoarthritis, as above. Original Report Authenticated By: Vinnie Langton, M.D.    Assessment & Plan:   Shameek was seen today for diabetes and annual exam.  Diagnoses and all orders for this visit:  Essential hypertension, benign- His blood pressure is well controlled.  Electrolytes and renal function are normal.  Will continue the current regimen. -     CBC with Differential/Platelet; Future -     TSH; Future -      Urinalysis, Routine w reflex microscopic; Future -     irbesartan-hydrochlorothiazide (AVALIDE) 300-12.5 MG tablet; Take 1 tablet by mouth daily. -     metoprolol tartrate (LOPRESSOR) 25 MG tablet; TAKE 1/2 TABLET BY MOUTH TWICE DAILY  Diabetes mellitus type 2 with neurological manifestations ([place])- His blood sugars are adequately well controlled.  Will continue metformin at the current dose. -     Microalbumin / creatinine urine ratio; Future -     Hemoglobin A1c; Future -     HM Diabetes Foot Exam -     irbesartan-hydrochlorothiazide (AVALIDE) 300-12.5 MG tablet; Take 1 tablet by mouth daily. -     metFORMIN (GLUCOPHAGE) 500 MG tablet; Take 2 tablets (1,000 mg total) by mouth 2 (two) times daily with a meal.  Elevated liver enzymes- His LFTs remain mildly elevated but this is stable over time and he is asymptomatic with this.  This is consistent with NASH. -     Comprehensive metabolic panel; Future  Hyperlipidemia with target LDL less than 100- He has achieved his LDL goal and is doing well on the statin. -     Lipid panel; Future -     TSH; Future  Gastroesophageal reflux disease with esophagitis-symptoms are well controlled. -     CBC with Differential/Platelet; Future  Atherosclerosis of native coronary artery of native heart without angina pectoris- He has had no recent angina.  He will keep nitroglycerin around if needed.  We will continue to work on risk factor modifications. -     nitroGLYCERIN (NITROSTAT) 0.4 MG SL tablet; Place 1 tablet (0.4 mg total) under the tongue every 5 (five) minutes as needed for chest pain. -     Lipid panel; Future -     metoprolol tartrate (LOPRESSOR) 25 MG tablet; TAKE 1/2 TABLET BY MOUTH TWICE  DAILY  Mild persistent asthma without complication- He is doing well on the ICS.  Will continue. -     beclomethasone (QVAR) 40 MCG/ACT inhaler; Inhale 2 puffs into the lungs 2 (two) times daily.   I have discontinued Riku H. Thul's metFORMIN,  esomeprazole, and metFORMIN. I have also changed his nitroGLYCERIN, irbesartan-hydrochlorothiazide, metoprolol tartrate, and metFORMIN. Additionally, I am having him maintain his aspirin EC, multivitamin, ONETOUCH VERIO FLEX SYSTEM, ONETOUCH DELICA LANCETS 70Y, glucose blood, omeprazole, atorvastatin, and beclomethasone.  Meds ordered this encounter  Medications  . nitroGLYCERIN (NITROSTAT) 0.4 MG SL tablet    Sig: Place 1 tablet (0.4 mg total) under the tongue every 5 (five) minutes as needed for chest pain.    Dispense:  10 tablet    Refill:  3  . irbesartan-hydrochlorothiazide (AVALIDE) 300-12.5 MG tablet    Sig: Take 1 tablet by mouth daily.    Dispense:  90 tablet    Refill:  1  . metoprolol tartrate (LOPRESSOR) 25 MG tablet    Sig: TAKE 1/2 TABLET BY MOUTH TWICE DAILY    Dispense:  90 tablet    Refill:  1  . metFORMIN (GLUCOPHAGE) 500 MG tablet    Sig: Take 2 tablets (1,000 mg total) by mouth 2 (two) times daily with a meal.    Dispense:  360 tablet    Refill:  1  . beclomethasone (QVAR) 40 MCG/ACT inhaler    Sig: Inhale 2 puffs into the lungs 2 (two) times daily.    Dispense:  3 Inhaler    Refill:  1   See AVS for instructions about healthy living and anticipatory guidance.   Follow-up: Return in about 6 months (around 08/11/2018).  Scarlette Calico, MD

## 2018-02-09 NOTE — Patient Instructions (Signed)

## 2018-02-09 NOTE — Assessment & Plan Note (Signed)

## 2018-04-14 ENCOUNTER — Ambulatory Visit: Payer: Self-pay | Admitting: *Deleted

## 2018-04-14 NOTE — Telephone Encounter (Signed)
Patient's daughter is calling stating her father has had mild consistent URQ pain that has increased from intermittent to more constant. Patient has had diarrhea today. Daughter states his pain got worse after eating potato chips last night. Patient has history of GERD and daughter feels he may be having gall bladder symptoms. Daughter is requesting appointment in office before taking him to the hospital- she was there all night and day today with her mother. Call to office- permission to schedule appointment for patient- with strict instructions if symptoms get worse over night- go to ED. Daughter agrees.  Reason for Disposition . [1] Pain lasts > 10 minutes AND [2] age > 29  Answer Assessment - Initial Assessment Questions 1. LOCATION: "Where does it hurt?"      URQ 2. RADIATION: "Does the pain shoot anywhere else?" (e.g., chest, back)     No- consistent  3. ONSET: "When did the pain begin?" (e.g., minutes, hours or days ago)      noticeably last few days 4. SUDDEN: "Gradual or sudden onset?"     gradual 5. PATTERN "Does the pain come and go, or is it constant?"    - If constant: "Is it getting better, staying the same, or worsening?"      (Note: Constant means the pain never goes away completely; most serious pain is constant and it progresses)     - If intermittent: "How long does it last?" "Do you have pain now?"     (Note: Intermittent means the pain goes away completely between bouts)     Constant- yes 6. SEVERITY: "How bad is the pain?"  (e.g., Scale 1-10; mild, moderate, or severe)    - MILD (1-3): doesn't interfere with normal activities, abdomen soft and not tender to touch     - MODERATE (4-7): interferes with normal activities or awakens from sleep, tender to touch     - SEVERE (8-10): excruciating pain, doubled over, unable to do any normal activities       mild 7. RECURRENT SYMPTOM: "Have you ever had this type of abdominal pain before?" If so, ask: "When was the last time?"  and "What happened that time?"      no 8. AGGRAVATING FACTORS: "Does anything seem to cause this pain?" (e.g., foods, stress, alcohol)     Food- potato chips 9. CARDIAC SYMPTOMS: "Do you have any of the following symptoms: chest pain, difficulty breathing, sweating, nausea?"     no 10. OTHER SYMPTOMS: "Do you have any other symptoms?" (e.g., fever, vomiting, diarrhea)       Diarrhea- 3 times today 11. PREGNANCY: "Is there any chance you are pregnant?" "When was your last menstrual period?"       n/a  Protocols used: ABDOMINAL PAIN - UPPER-A-AH

## 2018-04-15 ENCOUNTER — Telehealth: Payer: Self-pay

## 2018-04-15 ENCOUNTER — Encounter: Payer: Self-pay | Admitting: Family Medicine

## 2018-04-15 ENCOUNTER — Ambulatory Visit
Admission: RE | Admit: 2018-04-15 | Discharge: 2018-04-15 | Disposition: A | Discharge status: Home / Self Care | Payer: Medicare HMO | Source: Ambulatory Visit | Attending: Family Medicine | Admitting: Family Medicine

## 2018-04-15 ENCOUNTER — Other Ambulatory Visit (INDEPENDENT_AMBULATORY_CARE_PROVIDER_SITE_OTHER): Payer: Medicare HMO

## 2018-04-15 ENCOUNTER — Ambulatory Visit: Payer: Medicare HMO | Admitting: Family Medicine

## 2018-04-15 VITALS — BP 158/78 | HR 70 | Temp 97.6°F | Ht 70.0 in | Wt 180.0 lb

## 2018-04-15 DIAGNOSIS — N281 Cyst of kidney, acquired: Secondary | ICD-10-CM | POA: Diagnosis not present

## 2018-04-15 DIAGNOSIS — R110 Nausea: Secondary | ICD-10-CM | POA: Diagnosis not present

## 2018-04-15 DIAGNOSIS — R1011 Right upper quadrant pain: Secondary | ICD-10-CM

## 2018-04-15 LAB — CBC WITH DIFFERENTIAL/PLATELET
Basophils Absolute: 0.1 10*3/uL (ref 0.0–0.1)
Basophils Relative: 1.2 % (ref 0.0–3.0)
EOS PCT: 4.2 % (ref 0.0–5.0)
Eosinophils Absolute: 0.2 10*3/uL (ref 0.0–0.7)
HCT: 43.9 % (ref 39.0–52.0)
Hemoglobin: 14.7 g/dL (ref 13.0–17.0)
Lymphocytes Relative: 23.9 % (ref 12.0–46.0)
Lymphs Abs: 1.3 10*3/uL (ref 0.7–4.0)
MCHC: 33.4 g/dL (ref 30.0–36.0)
MCV: 94.5 fl (ref 78.0–100.0)
Monocytes Absolute: 0.6 10*3/uL (ref 0.1–1.0)
Monocytes Relative: 10.6 % (ref 3.0–12.0)
Neutro Abs: 3.2 10*3/uL (ref 1.4–7.7)
Neutrophils Relative %: 60.1 % (ref 43.0–77.0)
PLATELETS: 167 10*3/uL (ref 150.0–400.0)
RBC: 4.65 Mil/uL (ref 4.22–5.81)
RDW: 14.4 % (ref 11.5–15.5)
WBC: 5.4 10*3/uL (ref 4.0–10.5)

## 2018-04-15 LAB — COMPREHENSIVE METABOLIC PANEL
ALT: 82 U/L — ABNORMAL HIGH (ref 0–53)
AST: 54 U/L — ABNORMAL HIGH (ref 0–37)
Albumin: 4.3 g/dL (ref 3.5–5.2)
Alkaline Phosphatase: 138 U/L — ABNORMAL HIGH (ref 39–117)
BUN: 17 mg/dL (ref 6–23)
CO2: 30 mEq/L (ref 19–32)
Calcium: 9.8 mg/dL (ref 8.4–10.5)
Chloride: 95 mEq/L — ABNORMAL LOW (ref 96–112)
Creatinine, Ser: 0.81 mg/dL (ref 0.40–1.50)
GFR: 89.94 mL/min (ref >60.00)
GLUCOSE: 149 mg/dL — AB (ref 70–99)
Potassium: 4.3 mEq/L (ref 3.5–5.1)
Sodium: 134 mEq/L — ABNORMAL LOW (ref 135–145)
Total Bilirubin: 0.8 mg/dL (ref 0.2–1.2)
Total Protein: 7.9 g/dL (ref 6.0–8.3)

## 2018-04-15 LAB — LIPASE: Lipase: 18 U/L (ref 11.0–59.0)

## 2018-04-15 LAB — AMYLASE: Amylase: 60 U/L (ref 27–131)

## 2018-04-15 MED ORDER — ONDANSETRON HCL 4 MG/2ML IJ SOLN
4.0000 mg | Freq: Once | INTRAMUSCULAR | Status: AC
  Administered 2018-04-15: 4 mg via INTRAMUSCULAR

## 2018-04-15 NOTE — Telephone Encounter (Signed)
Spoke with Benjamine Mola today and info was given regarding results.

## 2018-04-15 NOTE — Progress Notes (Signed)
Subjective:    Patient ID: Manuel [name], male    DOB: Jan 06, 1929, 85 y.o.   MRN: [id number]  HPI   Mr. Wahba is an 85 year old male who presents today with right sided abdominal pain that started 2 days ago   Abdominal Pain: Pain described as dull  Location: Upper right quadrant pain Onset/Timing: 2 days ago in the evening at bedtime that lasted for "several hours"  Duration: Intermittently occurring over the past 2 days and duration is unclear Severity/Quality: Rated between 3 and 7 Worse/Better by: No Associated Symptoms:  Nausea, see below  ROS Loss of appetite: Mild loss of appetite when nausea is present Nausea: Yes Vomiting: No Diarrhea: loose stools x 3 that have resolved Rectal bleeding: No Fever: No Weight loss: No Diaphoresis: No SOB: No Chest Pain: No DOE: No Edema: No   Prior to pain, he reports eating potato chips, shrimp, and chili. He currently is taking Nexium daily which was recently changed from omeprazole due to insurance coverage recently.    Review of Systems  Constitutional: Positive for appetite change. Negative for chills and fever.  Respiratory: Negative for cough, shortness of breath and wheezing.   Cardiovascular: Negative for chest pain and palpitations.  Gastrointestinal: Positive for abdominal pain and nausea. Negative for blood in stool, constipation, diarrhea and vomiting.  Genitourinary: Negative for dysuria.  Musculoskeletal: Negative for myalgias.  Skin: Negative for rash.  Neurological: Negative for dizziness, weakness and headaches.  Psychiatric/Behavioral:       Denies anxious or depressed mood   Past Medical History:  Diagnosis Date  . Allergic rhinitis    childhood  . Asthma    Allergic component  . CAD (coronary artery disease)    s/p MI in 1/11. LHC (1/11) with 99% mCFX treated with 2.5 x 16 Xience V DES; 50% mLAD; EF 55%. ETT-myoview (10/12): 4'51", no significant ST segment changes, EF 56%, no ischemia or infarction.    . 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 Chronic kidney disease    stones  . Diabetes mellitus   . Dizziness    Holter (10/12): Frequent PACs and PVCs. No significant bradycardia.   [name] GERD (gastroesophageal reflux disease)   . Hx of cardiovascular stress test    ETT-Myoview (03/2013):  Inf defect on short axis images only (not felt to be significant), EF 68%; low risk.  [name] Hyperlipidemia   . Hypertension   . MI (myocardial infarction) ([place]) 02/2009  . Pneumonia    age 28     Social History   Socioeconomic History  . Marital status: Single    Spouse name: Not on file  . Number of children: Not on file  . Years of education: Not on file  . Highest education level: Not on file  Occupational History  . Occupation: retired  Scientific laboratory technician  . Financial resource strain: Not on file  . Food insecurity:    Worry: Not on file    Inability: Not on file  . Transportation needs:    Medical: Not on file    Non-medical: Not on file  Tobacco Use  . Smoking status: Former Smoker    Types: Cigarettes    Last attempt to quit: 06/09/1966    Years since quitting: 51.8  . Smokeless tobacco: Never Used  Substance and Sexual Activity  . Alcohol use: No    Alcohol/week: 0.0 standard drinks  . Drug use: No  . Sexual activity: Not Currently  Lifestyle  . Physical activity:    Days per week: Not on file    Minutes per session: Not on file  . Stress: Not on file  Relationships  . Social connections:    Talks on phone: Not on file    Gets together: Not on file    Attends religious service: Not on file    Active member of club or organization: Not on file    Attends meetings of clubs or organizations: Not on file    Relationship status: Not on file  . Intimate partner violence:    Fear of current or ex partner: Not on file    Emotionally abused: Not on file    Physically abused: Not on file    Forced sexual activity: Not on file  Other Topics Concern  . Not on file  Social History  Narrative   Associates degree.     Past Surgical History:  Procedure Laterality Date  . APPENDECTOMY    . CORONARY STENT PLACEMENT      Family History  Problem Relation Age of Onset  . Diabetes Mother   . Heart failure Mother   . Heart attack Father   . Asthma Father   . Cancer Neg Hx     Allergies  Allergen Reactions  . Other     Cats; dust; environmental-itchy eyes, throat irritation    Current Outpatient Medications on File Prior to Visit  Medication Sig Dispense Refill  . aspirin EC 81 MG EC tablet Take 1 tablet (81 mg total) by mouth daily.    [name] atorvastatin (LIPITOR) 40 MG tablet TAKE 1 TABLET BY MOUTH EVERY DAY 90 tablet 1  . beclomethasone (QVAR) 40 MCG/ACT inhaler Inhale 2 puffs into the lungs 2 (two) times daily. 3 Inhaler 1  . Blood Glucose Monitoring Suppl (ONETOUCH VERIO FLEX SYSTEM) w/Device KIT 1 Device by Does not apply route daily. Use to check blood sugars daily Dx E11.9 1 kit 0  . esomeprazole (NEXIUM) 40 MG capsule     . glucose blood (ONETOUCH VERIO) test strip USE TO CHECK BLOOD SUGARS TWICE A DAY 100 each 3  . irbesartan-hydrochlorothiazide (AVALIDE) 300-12.5 MG tablet Take 1 tablet by mouth daily. 90 tablet 1  . metFORMIN (GLUCOPHAGE) 500 MG tablet Take 2 tablets (1,000 mg total) by mouth 2 (two) times daily with a meal. 360 tablet 1  . metoprolol tartrate (LOPRESSOR) 25 MG tablet TAKE 1/2 TABLET BY MOUTH TWICE DAILY 90 tablet 1  . Multiple Vitamin (MULTIVITAMIN) tablet Take 1 tablet by mouth daily.    . nitroGLYCERIN (NITROSTAT) 0.4 MG SL tablet Place 1 tablet (0.4 mg total) under the tongue every 5 (five) minutes as needed for chest pain. 10 tablet 3  . ONETOUCH DELICA LANCETS 62M MISC Use to help check blood sugars twice a day Dx E11.9 100 each 3  . omeprazole (PRILOSEC) 40 MG capsule Take 1 capsule (40 mg total) by mouth daily. (Patient not taking: Reported on 04/15/2018) 90 capsule 1   No current facility-administered medications on file prior to  visit.     BP (!) 158/78 (BP Location: Left Arm, Patient Position: Sitting, Cuff Size: Normal)   Pulse 70   Temp 97.6 F (36.4 C) (Oral)   Ht 5' 10" (1.778 m)   Wt 180 lb (81.6 kg)   SpO2 94%   BMI 25.83 kg/m       Objective:   Physical Exam Constitutional:      Appearance: He is well-developed.  Eyes:     General: No scleral icterus.    Pupils: Pupils are equal, round, and reactive to light.  Cardiovascular:     Rate and Rhythm: Normal rate and regular rhythm.     Heart sounds: Normal heart sounds.  Pulmonary:     Effort: Pulmonary effort is normal.     Breath sounds: Normal breath sounds.  Abdominal:     General: Bowel sounds are normal.     Palpations: Abdomen is soft.     Tenderness: There is abdominal tenderness in the right upper quadrant. There is no right CVA tenderness, left CVA tenderness, guarding or rebound. Negative signs include Murphy's sign and McBurney's sign.  Skin:    General: Skin is warm and dry.     Capillary Refill: Capillary refill takes less than 2 seconds.  Neurological:     Mental Status: He is alert.  Psychiatric:        Mood and Affect: Mood normal.        Behavior: Behavior normal.        Assessment & Plan:  1. Right upper quadrant pain Generalized abdominal tenderness present, symptoms are concerning for gallbladder involvement. Will obtain lab work to rule out infectious process, evaluate liver/pancreatic enzymes, and obtain an abdominal US for further evaluation of symptoms. Provided ondansetron for nausea at this time, advised avoidance of foods that can trigger symptoms particularly fatty/fried foods, and those that can trigger reflux. Advised supportive measure of remaining hydrated and continue Nexium as prescribed by PCP. Advised close follow up if symptoms worsen, persist, or new symptoms develop. Patient and daughter voiced understanding and agreed with plan.  - CBC with Differential/Platelet; Future - Comprehensive metabolic  panel; Future - Amylase; Future - Lipase; Future - US Abdomen Complete; Future  2. Nausea Ondansetron provided, no vomiting present, lab work and abdominal US for further evaluation.  - ondansetron (ZOFRAN) injection 4 mg   Delano Metz, FNP-C

## 2018-04-15 NOTE — Patient Instructions (Signed)
Please go to the lab before you leave.  We will contact you about the results of your lab work and abdominal ultrasound.  Increase water intake and avoid foods that are likely to contribute to pain such as fried and high fat foods.    If symptoms worsen, please follow up for further evaluation.    Gallbladder Eating Plan If you have a gallbladder condition, you may have trouble digesting fats. Eating a low-fat diet can help reduce your symptoms, and may be helpful before and after having surgery to remove your gallbladder (cholecystectomy). Your health care provider may recommend that you work with a diet and nutrition specialist (dietitian) to help you reduce the amount of fat in your diet. What are tips for following this plan? General guidelines  Limit your fat intake to less than 30% of your total daily calories. If you eat around 1,800 calories each day, this is less than 60 grams (g) of fat per day.  Fat is an important part of a healthy diet. Eating a low-fat diet can make it hard to maintain a healthy body weight. Ask your dietitian how much fat, calories, and other nutrients you need each day.  Eat small, frequent meals throughout the day instead of three large meals.  Drink at least 8-10 cups of fluid a day. Drink enough fluid to keep your urine clear or pale yellow.  Limit alcohol intake to no more than 1 drink a day for nonpregnant women and 2 drinks a day for men. One drink equals 12 oz of beer, 5 oz of wine, or 1 oz of hard liquor. Reading food labels  Check Nutrition Facts on food labels for the amount of fat per serving. Choose foods with less than 3 grams of fat per serving. Shopping  Choose nonfat and low-fat healthy foods. Look for the words "nonfat," "low fat," or "fat free."  Avoid buying processed or prepackaged foods. Cooking  Cook using low-fat methods, such as baking, broiling, grilling, or boiling.  Cook with small amounts of healthy fats, such as olive  oil, grapeseed oil, canola oil, or sunflower oil. What foods are recommended?   All fresh, frozen, or canned fruits and vegetables.  Whole grains.  Low-fat or non-fat (skim) milk and yogurt.  Lean meat, skinless poultry, fish, eggs, and beans.  Low-fat protein supplement powders or drinks.  Spices and herbs. What foods are not recommended?  High-fat foods. These include baked goods, fast food, fatty cuts of meat, ice cream, french toast, sweet rolls, pizza, cheese bread, foods covered with butter, creamy sauces, or cheese.  Fried foods. These include french fries, tempura, battered fish, breaded chicken, fried breads, and sweets.  Foods with strong odors.  Foods that cause bloating and gas. Summary  A low-fat diet can be helpful if you have a gallbladder condition, or before and after gallbladder surgery.  Limit your fat intake to less than 30% of your total daily calories. This is about 60 g of fat if you eat 1,800 calories each day.  Eat small, frequent meals throughout the day instead of three large meals. This information is not intended to replace advice given to you by your health care provider. Make sure you discuss any questions you have with your health care provider. Document Released: 02/15/2013 Document Revised: 03/20/2016 Document Reviewed: 03/20/2016 Elsevier Interactive Patient Education  2019 [employer].

## 2018-05-28 ENCOUNTER — Other Ambulatory Visit: Payer: Self-pay | Admitting: Internal Medicine

## 2018-05-28 DIAGNOSIS — K2100 Gastro-esophageal reflux disease with esophagitis, without bleeding: Secondary | ICD-10-CM

## 2018-06-03 ENCOUNTER — Other Ambulatory Visit: Payer: Self-pay | Admitting: Internal Medicine

## 2018-06-03 DIAGNOSIS — E785 Hyperlipidemia, unspecified: Secondary | ICD-10-CM

## 2018-06-14 ENCOUNTER — Other Ambulatory Visit: Payer: Self-pay | Admitting: Internal Medicine

## 2018-07-01 ENCOUNTER — Encounter: Payer: Medicare HMO | Admitting: Cardiology

## 2018-07-01 ENCOUNTER — Other Ambulatory Visit: Payer: Self-pay

## 2018-07-13 ENCOUNTER — Telehealth: Payer: Medicare HMO | Admitting: Cardiology

## 2018-07-20 ENCOUNTER — Other Ambulatory Visit: Payer: Self-pay

## 2018-07-20 ENCOUNTER — Telehealth (INDEPENDENT_AMBULATORY_CARE_PROVIDER_SITE_OTHER): Payer: Medicare HMO | Admitting: Cardiology

## 2018-07-20 ENCOUNTER — Encounter: Payer: Self-pay | Admitting: Cardiology

## 2018-07-20 VITALS — BP 110/68 | HR 72 | Ht 70.0 in | Wt 175.0 lb

## 2018-07-20 DIAGNOSIS — R748 Abnormal levels of other serum enzymes: Secondary | ICD-10-CM

## 2018-07-20 DIAGNOSIS — I2510 Atherosclerotic heart disease of native coronary artery without angina pectoris: Secondary | ICD-10-CM | POA: Diagnosis not present

## 2018-07-20 DIAGNOSIS — E785 Hyperlipidemia, unspecified: Secondary | ICD-10-CM

## 2018-07-20 DIAGNOSIS — I10 Essential (primary) hypertension: Secondary | ICD-10-CM

## 2018-07-20 NOTE — Progress Notes (Signed)
Virtual Visit via Video Note   This visit type was conducted due to national recommendations for restrictions regarding the COVID-19 Pandemic (e.g. social distancing) in an effort to limit this patient's exposure and mitigate transmission in our community.  Due to his co-morbid illnesses, this patient is at least at moderate risk for complications without adequate follow up.  This format is felt to be most appropriate for this patient at this time.  All issues noted in this document were discussed and addressed.  A limited physical exam was performed with this format.  Please refer to the patient's chart for his consent to telehealth for [facility].   Date:  07/20/2018   ID:  [name], DOB 08-30-1928, MRN [id number]  Patient Location: Home Provider Location: Home  PCP:  [name], MD  Cardiologist:  [name], MD  Electrophysiologist:  None   Evaluation Performed:  Follow-Up Visit  Chief Complaint: CAD follow-up  History of Present Illness:    Manuel [name] is a 85 y.o. male with history of CAD status post myocardial infarction in January 2011 with diabetes hypertension hyperlipidemia here for follow-up.  He developed bilateral shoulder pain as angina in 2011 and was found to have MI. Heart catheterization showed 99% mid circumflex which is treated with DES. EF was normal. In [place].   Nuclear stress test in February 2015 showed no ischemia.  Overall he is doing quite well. Still walking. Golf. Occasional atypical chest discomfort.  06/30/17 - [place] bags, big, heavy. Last one heart pain. NTG sat down and went away. None since then. [place], walks 5 days a week, one mile. Still golfing. Chair exercise at church.  He does have some seasonal allergies which results in mild wheezing.  No bleeding, no syncope.  He knows to get up slowly from the bed.  Daughter with him again today  5/26 last 20-doing very well here for follow-up of CAD.  He is here with his  daughter.  Doing great, no chest pain no fevers chills nausea vomiting syncope bleeding.  Still quite active walking 20 minutes daily and [place].  Very rarely will he ever have chest pain, might uses nitroglycerin once or twice maybe 3 times a year.  Compliant with his medications.  The patient does not have symptoms concerning for COVID-19 infection (fever, chills, cough, or new shortness of breath).    Past Medical History:  Diagnosis Date  . Allergic rhinitis    childhood  . Asthma    Allergic component  . CAD (coronary artery disease)    s/p MI in 1/11. LHC (1/11) with 99% mCFX treated with 2.5 x 16 Xience V DES; 50% mLAD; EF 55%. ETT-myoview (10/12): 4'51", no significant ST segment changes, EF 56%, no ischemia or infarction.  . 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 Chronic kidney disease    stones  . Diabetes mellitus   . Dizziness    Holter (10/12): Frequent PACs and PVCs. No significant bradycardia.   [name] GERD (gastroesophageal reflux disease)   . Hx of cardiovascular stress test    ETT-Myoview (03/2013):  Inf defect on short axis images only (not felt to be significant), EF 68%; low risk.  [name] Hyperlipidemia   . Hypertension   . MI (myocardial infarction) ([place]) 02/2009  . Pneumonia    age 9   Past Surgical History:  Procedure Laterality Date  . APPENDECTOMY    . CORONARY STENT PLACEMENT  Current Meds  Medication Sig  . aspirin EC 81 MG EC tablet Take 1 tablet (81 mg total) by mouth daily.  [name] atorvastatin (LIPITOR) 40 MG tablet TAKE 1 TABLET BY MOUTH EVERY DAY  . beclomethasone (QVAR) 40 MCG/ACT inhaler Inhale 2 puffs into the lungs 2 (two) times daily.  . Blood Glucose Monitoring Suppl ([place]) w/Device KIT 1 Device by Does not apply route daily. Use to check blood sugars daily Dx E11.9  . esomeprazole (NEXIUM) 40 MG capsule TAKE 1 CAPSULE BY MOUTH EVERY DAY  . glucose blood (ONETOUCH VERIO) test strip USE TO CHECK BLOOD SUGARS TWICE A  DAY  . irbesartan-hydrochlorothiazide (AVALIDE) 300-12.5 MG tablet Take 1 tablet by mouth daily.  . metFORMIN (GLUCOPHAGE) 500 MG tablet Take 2 tablets (1,000 mg total) by mouth 2 (two) times daily with a meal.  . metoprolol tartrate (LOPRESSOR) 25 MG tablet TAKE 1/2 TABLET BY MOUTH TWICE DAILY  . Multiple Vitamin (MULTIVITAMIN) tablet Take 1 tablet by mouth daily.  . nitroGLYCERIN (NITROSTAT) 0.4 MG SL tablet Place 1 tablet (0.4 mg total) under the tongue every 5 (five) minutes as needed for chest pain.  [name] omeprazole (PRILOSEC) 40 MG capsule TAKE 1 CAPSULE BY MOUTH EVERY DAY  . ONETOUCH DELICA LANCETS 41P MISC Use to help check blood sugars twice a day Dx E11.9     Allergies:   Other   Social History   Tobacco Use  . Smoking status: Former Smoker    Types: Cigarettes    Last attempt to quit: 06/09/1966    Years since quitting: 52.1  . Smokeless tobacco: Never Used  Substance Use Topics  . Alcohol use: No    Alcohol/week: 0.0 standard drinks  . Drug use: No     Family Hx: The patient's family history includes Asthma in his father; Diabetes in his mother; Heart attack in his father; Heart failure in his mother. There is no history of Cancer.  ROS:   Please see the history of present illness.     All other systems reviewed and are negative.   Prior CV studies:   The following studies were reviewed today:  Prior echocardiogram nuclear stress test 2015 reassuring.  Labs/Other Tests and Data Reviewed:    EKG:  An ECG dated 08/12/17 was personally reviewed today and demonstrated:  Sinus rhythm with nonspecific ST-T wave changes  Recent Labs: 02/09/2018: TSH 2.23 04/15/2018: ALT 82; BUN 17; Creatinine, Ser 0.81; Hemoglobin 14.7; Platelets 167.0; Potassium 4.3; Sodium 134   Recent Lipid Panel Lab Results  Component Value Date/Time   CHOL 158 02/09/2018 09:44 AM   TRIG 117.0 02/09/2018 09:44 AM   HDL 47.20 02/09/2018 09:44 AM   CHOLHDL 3 02/09/2018 09:44 AM   LDLCALC 87  02/09/2018 09:44 AM    Wt Readings from Last 3 Encounters:  07/20/18 175 lb (79.4 kg)  04/15/18 180 lb (81.6 kg)  02/09/18 182 lb 8 oz (82.8 kg)     Objective:    Vital Signs:  BP 110/68   Pulse 72   Ht 5' 10" (1.778 m)   Wt 175 lb (79.4 kg)   BMI 25.11 kg/m    VITAL SIGNS:  reviewed GEN:  no acute distress EYES:  sclerae anicteric, EOMI - Extraocular Movements Intact RESPIRATORY:  normal respiratory effort, symmetric expansion SKIN:  no rash, lesions or ulcers. MUSCULOSKELETAL:  no obvious deformities. NEURO:  alert and oriented x 3, no obvious focal deficit PSYCH:  normal affect  ASSESSMENT & PLAN:    Coronary artery disease - Circumflex DES 2011.  [place].  Doing well.  No anginal symptoms. -Previously he did have a possible anginal symptoms when carrying a heavy load.  Overall leading.  Still doing well.  Very rarely uses nitroglycerin.  Instructed him to at least replenish the supply every 6 months to maintain freshness.  Essential hypertension -Well-controlled.  No changes made.  Medications reviewed.  Hyperlipidemia - LDL 66 previously.  Doing very well.  Dr. Ronnald Ramp is following closely.  Secondary prevention with statin therapy.  Elevated liver enzymes - Mildly elevated AST and ALT for years.  Continuing with statin therapy since they are not 3 times the upper limit of normal.  First-degree AV block -Minimal.  On very low-dose beta-blocker.  COVID-19 Education: The signs and symptoms of COVID-19 were discussed with the patient and how to seek care for testing (follow up with PCP or arrange E-visit).  The importance of social distancing was discussed today.  Time:   Today, I have spent 15 minutes with the patient with telehealth technology discussing the above problems.     Medication Adjustments/Labs and Tests Ordered: Current medicines are reviewed at length with the patient today.  Concerns regarding medicines are outlined above.   Tests Ordered: No  orders of the defined types were placed in this encounter.   Medication Changes: No orders of the defined types were placed in this encounter.   Disposition:  Follow up in 1 year(s)  Signed, [name], MD  07/20/2018 4:02 PM    [facility]

## 2018-07-20 NOTE — Patient Instructions (Signed)
Medication Instructions:  The current medical regimen is effective;  continue present plan and medications.  If you need a refill on your cardiac medications before your next appointment, please call your pharmacy.   Follow-Up: At [facility], you and your health needs are our priority.  As part of our continuing mission to provide you with exceptional heart care, we have created designated Provider Care Teams.  These Care Teams include your primary Cardiologist (physician) and Advanced Practice Providers (APPs -  Physician Assistants and Nurse Practitioners) who all work together to provide you with the care you need, when you need it. You will need a follow up appointment in 12 months.  Please call our office 2 months in advance to schedule this appointment.  You may see Mark Skains, MD or one of the following Advanced Practice Providers on your designated Care Team:   Lori Gerhardt, NP Laura Ingold, NP . Jill McDaniel, NP  Thank you for choosing [facility]!!      

## 2018-08-06 ENCOUNTER — Other Ambulatory Visit: Payer: Self-pay | Admitting: Internal Medicine

## 2018-08-06 DIAGNOSIS — E1149 Type 2 diabetes mellitus with other diabetic neurological complication: Secondary | ICD-10-CM

## 2018-08-06 DIAGNOSIS — I10 Essential (primary) hypertension: Secondary | ICD-10-CM

## 2018-09-27 NOTE — Progress Notes (Signed)
This encounter was created in error - please disregard.

## 2018-09-29 DIAGNOSIS — L570 Actinic keratosis: Secondary | ICD-10-CM | POA: Diagnosis not present

## 2018-09-29 DIAGNOSIS — L719 Rosacea, unspecified: Secondary | ICD-10-CM | POA: Diagnosis not present

## 2018-09-29 DIAGNOSIS — D225 Melanocytic nevi of trunk: Secondary | ICD-10-CM | POA: Diagnosis not present

## 2018-09-29 DIAGNOSIS — D485 Neoplasm of uncertain behavior of skin: Secondary | ICD-10-CM | POA: Diagnosis not present

## 2018-09-29 DIAGNOSIS — Z85828 Personal history of other malignant neoplasm of skin: Secondary | ICD-10-CM | POA: Diagnosis not present

## 2018-09-29 DIAGNOSIS — L988 Other specified disorders of the skin and subcutaneous tissue: Secondary | ICD-10-CM | POA: Diagnosis not present

## 2018-09-29 DIAGNOSIS — D692 Other nonthrombocytopenic purpura: Secondary | ICD-10-CM | POA: Diagnosis not present

## 2018-09-29 DIAGNOSIS — L814 Other melanin hyperpigmentation: Secondary | ICD-10-CM | POA: Diagnosis not present

## 2018-09-29 DIAGNOSIS — L821 Other seborrheic keratosis: Secondary | ICD-10-CM | POA: Diagnosis not present

## 2018-10-04 ENCOUNTER — Other Ambulatory Visit: Payer: Self-pay | Admitting: Internal Medicine

## 2018-10-04 DIAGNOSIS — E1149 Type 2 diabetes mellitus with other diabetic neurological complication: Secondary | ICD-10-CM

## 2018-10-11 ENCOUNTER — Other Ambulatory Visit (INDEPENDENT_AMBULATORY_CARE_PROVIDER_SITE_OTHER): Payer: Medicare HMO

## 2018-10-11 ENCOUNTER — Encounter: Payer: Self-pay | Admitting: Internal Medicine

## 2018-10-11 ENCOUNTER — Other Ambulatory Visit: Payer: Self-pay

## 2018-10-11 ENCOUNTER — Ambulatory Visit (INDEPENDENT_AMBULATORY_CARE_PROVIDER_SITE_OTHER): Payer: Medicare HMO | Admitting: Internal Medicine

## 2018-10-11 VITALS — BP 120/64 | HR 66 | Temp 98.3°F | Resp 14 | Ht 70.0 in | Wt 182.0 lb

## 2018-10-11 DIAGNOSIS — E1149 Type 2 diabetes mellitus with other diabetic neurological complication: Secondary | ICD-10-CM

## 2018-10-11 DIAGNOSIS — K7581 Nonalcoholic steatohepatitis (NASH): Secondary | ICD-10-CM

## 2018-10-11 DIAGNOSIS — I10 Essential (primary) hypertension: Secondary | ICD-10-CM

## 2018-10-11 LAB — BASIC METABOLIC PANEL
BUN: 27 mg/dL — ABNORMAL HIGH (ref 6–23)
CO2: 33 mEq/L — ABNORMAL HIGH (ref 19–32)
Calcium: 9.3 mg/dL (ref 8.4–10.5)
Chloride: 99 mEq/L (ref 96–112)
Creatinine, Ser: 1.1 mg/dL (ref 0.40–1.50)
GFR: 63.11 mL/min (ref >60.00)
Glucose, Bld: 164 mg/dL — ABNORMAL HIGH (ref 70–99)
Potassium: 4.7 mEq/L (ref 3.5–5.1)
Sodium: 138 mEq/L (ref 135–145)

## 2018-10-11 LAB — HEPATIC FUNCTION PANEL
ALT: 57 U/L — ABNORMAL HIGH (ref 0–53)
AST: 56 U/L — ABNORMAL HIGH (ref 0–37)
Albumin: 4 g/dL (ref 3.5–5.2)
Alkaline Phosphatase: 111 U/L (ref 39–117)
Bilirubin, Direct: 0.1 mg/dL (ref 0.0–0.3)
Total Bilirubin: 0.4 mg/dL (ref 0.2–1.2)
Total Protein: 7.3 g/dL (ref 6.0–8.3)

## 2018-10-11 LAB — HEMOGLOBIN A1C: Hgb A1c MFr Bld: 6.7 % — ABNORMAL HIGH (ref 4.6–6.5)

## 2018-10-11 NOTE — Progress Notes (Signed)
Subjective:  Patient ID: Manuel [name], male    DOB: [birth year]  Age: 85 y.o. MRN: [id number]  CC: Diabetes   HPI Manuel [name] presents for f/up - He complains of weight gain.  He is active and denies any recent episodes of CP or DOE.  He tells me his blood sugars have been well controlled.  Outpatient Medications Prior to Visit  Medication Sig Dispense Refill   aspirin EC 81 MG EC tablet Take 1 tablet (81 mg total) by mouth daily.     atorvastatin (LIPITOR) 40 MG tablet TAKE 1 TABLET BY MOUTH EVERY DAY 90 tablet 1   beclomethasone (QVAR) 40 MCG/ACT inhaler Inhale 2 puffs into the lungs 2 (two) times daily. 3 Inhaler 1   Blood Glucose Monitoring Suppl ([place]) w/Device KIT 1 Device by Does not apply route daily. Use to check blood sugars daily Dx E11.9 1 kit 0   esomeprazole (NEXIUM) 40 MG capsule TAKE 1 CAPSULE BY MOUTH EVERY DAY 90 capsule 1   glucose blood (ONETOUCH VERIO) test strip USE TO CHECK BLOOD SUGARS TWICE A DAY 100 each 3   irbesartan-hydrochlorothiazide (AVALIDE) 300-12.5 MG tablet TAKE 1 TABLET BY MOUTH EVERY DAY 90 tablet 0   metFORMIN (GLUCOPHAGE) 500 MG tablet Take 2 tablets (1,000 mg total) by mouth 2 (two) times daily with a meal. 360 tablet 1   metoprolol tartrate (LOPRESSOR) 25 MG tablet TAKE 1/2 TABLET BY MOUTH TWICE DAILY 90 tablet 1   Multiple Vitamin (MULTIVITAMIN) tablet Take 1 tablet by mouth daily.     nitroGLYCERIN (NITROSTAT) 0.4 MG SL tablet Place 1 tablet (0.4 mg total) under the tongue every 5 (five) minutes as needed for chest pain. 10 tablet 3   omeprazole (PRILOSEC) 40 MG capsule TAKE 1 CAPSULE BY MOUTH EVERY DAY 90 capsule 1   ONETOUCH DELICA LANCETS 27C MISC Use to help check blood sugars twice a day Dx E11.9 100 each 3   No facility-administered medications prior to visit.     ROS Review of Systems  Constitutional: Positive for unexpected weight change. Negative for diaphoresis and fatigue.  HENT: Negative.   Eyes:  Negative for visual disturbance.  Respiratory: Negative for cough, chest tightness, shortness of breath and wheezing.   Cardiovascular: Negative for chest pain, palpitations and leg swelling.  Gastrointestinal: Negative for abdominal pain, constipation, diarrhea, nausea and vomiting.  Endocrine: Negative.  Negative for polydipsia, polyphagia and polyuria.  Genitourinary: Negative.  Negative for difficulty urinating.  Musculoskeletal: Negative.  Negative for arthralgias and myalgias.  Skin: Negative.  Negative for pallor.  Neurological: Negative.  Negative for dizziness, weakness and light-headedness.  Hematological: Negative for adenopathy. Does not bruise/bleed easily.  Psychiatric/Behavioral: Negative.     Objective:  BP 120/64    Pulse 66    Temp 98.3 F (36.8 C) (Temporal)    Resp 14    Ht 5' 10"  (1.778 m)    Wt 182 lb (82.6 kg)    SpO2 95%    BMI 26.11 kg/m   BP Readings from Last 3 Encounters:  10/11/18 120/64  07/20/18 110/68  04/15/18 (!) 158/78    Wt Readings from Last 3 Encounters:  10/11/18 182 lb (82.6 kg)  07/20/18 175 lb (79.4 kg)  04/15/18 180 lb (81.6 kg)    Physical Exam Vitals signs reviewed.  Constitutional:      Appearance: He is not ill-appearing or diaphoretic.  HENT:     Nose: Nose normal.  Mouth/Throat:     Mouth: Mucous membranes are moist.  Eyes:     General: No scleral icterus.    Conjunctiva/sclera: Conjunctivae normal.  Neck:     Musculoskeletal: Normal range of motion and neck supple. No neck rigidity or muscular tenderness.  Cardiovascular:     Rate and Rhythm: Normal rate and regular rhythm.     Pulses: Normal pulses.     Heart sounds: No murmur.  Pulmonary:     Effort: Pulmonary effort is normal.     Breath sounds: No stridor. No wheezing, rhonchi or rales.  Abdominal:     General: Abdomen is flat. Bowel sounds are normal. There is no distension.     Palpations: There is no hepatomegaly, splenomegaly or mass.     Tenderness:  There is no abdominal tenderness.  Musculoskeletal: Normal range of motion.     Right lower leg: No edema.     Left lower leg: No edema.  Lymphadenopathy:     Cervical: No cervical adenopathy.  Skin:    General: Skin is warm and dry.     Coloration: Skin is not pale.  Neurological:     General: No focal deficit present.     Mental Status: He is oriented to person, place, and time. Mental status is at baseline.  Psychiatric:        Mood and Affect: Mood normal.        Behavior: Behavior normal.     Lab Results  Component Value Date   WBC 5.4 04/15/2018   HGB 14.7 04/15/2018   HCT 43.9 04/15/2018   PLT 167.0 04/15/2018   GLUCOSE 164 (H) 10/11/2018   CHOL 158 02/09/2018   TRIG 117.0 02/09/2018   HDL 47.20 02/09/2018   LDLCALC 87 02/09/2018   ALT 57 (H) 10/11/2018   AST 56 (H) 10/11/2018   NA 138 10/11/2018   K 4.7 10/11/2018   CL 99 10/11/2018   CREATININE 1.10 10/11/2018   BUN 27 (H) 10/11/2018   CO2 33 (H) 10/11/2018   TSH 2.23 02/09/2018   HGBA1C 6.7 (H) 10/11/2018   MICROALBUR 1.0 02/09/2018    US Abdomen Complete  Result Date: 04/15/2018 CLINICAL DATA:  RIGHT upper quadrant abdominal pain. EXAM: ABDOMEN ULTRASOUND COMPLETE COMPARISON:  None. FINDINGS: Gallbladder: No gallstones or wall thickening visualized. No sonographic Murphy sign noted by sonographer. Common bile duct: Diameter: Normal at 4 mm Liver: Heterogeneous liver echotexture. No focal lesion. No duct dilatation. Portal vein is patent on color Doppler imaging with normal direction of blood flow towards the liver. IVC: No abnormality visualized. Pancreas: Visualized portion unremarkable. Spleen: Size and appearance within normal limits. Right Kidney: Length: 10.3 cm. 2 anechoic cysts in lower pole measuring 1.73.6 cm. Left Kidney: Length: 9.9 cm. Large anechoic cysts measuring 5 cm. Second smaller 1 mm anechoic cyst. Abdominal aorta: No aneurysm visualized. Other findings: None. IMPRESSION: 1. No evidence of  cholecystitis.  No acute findings. 2. Benign appearing renal cysts. Electronically Signed   By: Suzy Bouchard M.D.   On: 04/15/2018 11:50    Assessment & Plan:   [name] was seen today for diabetes.  Diagnoses and all orders for this visit:  Nonalcoholic steatohepatitis (NASH)- His LFTs remain mildly elevated but at his age treatment is not indicated. -     Hepatic function panel; Future  Diabetes mellitus type 2 with neurological manifestations ([place])- His blood sugars are adequately well controlled. -     Basic metabolic panel; Future -  Hemoglobin A1c; Future -     Ambulatory referral to Ophthalmology  Essential hypertension, benign- His blood pressure is well controlled.  Electrolytes and renal function are normal. -     Basic metabolic panel; Future   I am having [name] maintain his aspirin EC, multivitamin, OneTouch Verio Flex System, OneTouch Delica Lancets 12J, glucose blood, nitroGLYCERIN, metoprolol tartrate, metFORMIN, beclomethasone, omeprazole, atorvastatin, esomeprazole, and irbesartan-hydrochlorothiazide.  No orders of the defined types were placed in this encounter.    Follow-up: Return in about 6 months (around 04/13/2019).  Scarlette Calico, MD

## 2018-10-11 NOTE — Patient Instructions (Signed)
Type 2 Diabetes Mellitus, Diagnosis, Adult Type 2 diabetes (type 2 diabetes mellitus) is a long-term (chronic) disease. In type 2 diabetes, one or both of these problems may be present:  The pancreas does not make enough of a hormone called insulin.  Cells in the body do not respond properly to insulin that the body makes (insulin resistance). Normally, insulin allows blood sugar (glucose) to enter cells in the body. The cells use glucose for energy. Insulin resistance or lack of insulin causes excess glucose to build up in the blood instead of going into cells. As a result, high blood glucose (hyperglycemia) develops. What increases the risk? The following factors may make you more likely to develop type 2 diabetes:  Having a family member with type 2 diabetes.  Being overweight or obese.  Having an inactive (sedentary) lifestyle.  Having been diagnosed with insulin resistance.  Having a history of prediabetes, gestational diabetes, or polycystic ovary syndrome (PCOS).  Being of American-Indian, African-American, Hispanic/Latino, or Asian/Pacific Islander descent. What are the signs or symptoms? In the early stage of this condition, you may not have symptoms. Symptoms develop slowly and may include:  Increased thirst (polydipsia).  Increased hunger(polyphagia).  Increased urination (polyuria).  Increased urination during the night (nocturia).  Unexplained weight loss.  Frequent infections that keep coming back (recurring).  Fatigue.  Weakness.  Vision changes, such as blurry vision.  Cuts or bruises that are slow to heal.  Tingling or numbness in the hands or feet.  Dark patches on the skin (acanthosis nigricans). How is this diagnosed? This condition is diagnosed based on your symptoms, your medical history, a physical exam, and your blood glucose level. Your blood glucose may be checked with one or more of the following blood tests:  A fasting blood glucose (FBG)  test. You will not be allowed to eat (you will fast) for 8 hours or longer before a blood sample is taken.  A random blood glucose test. This test checks blood glucose at any time of day regardless of when you ate.  An A1c (hemoglobin A1c) blood test. This test provides information about blood glucose control over the previous 2-3 months.  An oral glucose tolerance test (OGTT). This test measures your blood glucose at two times: ? After fasting. This is your baseline blood glucose level. ? Two hours after drinking a beverage that contains glucose. You may be diagnosed with type 2 diabetes if:  Your FBG level is 126 mg/dL (7.0 mmol/L) or higher.  Your random blood glucose level is 200 mg/dL (11.1 mmol/L) or higher.  Your A1c level is 6.5% or higher.  Your OGTT result is higher than 200 mg/dL (11.1 mmol/L). These blood tests may be repeated to confirm your diagnosis. How is this treated? Your treatment may be managed by a specialist called an endocrinologist. Type 2 diabetes may be treated by following instructions from your health care provider about:  Making diet and lifestyle changes. This may include: ? Following an individualized nutrition plan that is developed by a diet and nutrition specialist (registered dietitian). ? Exercising regularly. ? Finding ways to manage stress.  Checking your blood glucose level as often as told.  Taking diabetes medicines or insulin daily. This helps to keep your blood glucose levels in the healthy range. ? If you use insulin, you may need to adjust the dosage depending on how physically active you are and what foods you eat. Your health care provider will tell you how to adjust your dosage.    Taking medicines to help prevent complications from diabetes, such as: ? Aspirin. ? Medicine to lower cholesterol. ? Medicine to control blood pressure. Your health care provider will set individualized treatment goals for you. Your goals will be based on  your age, other medical conditions you have, and how you respond to diabetes treatment. Generally, the goal of treatment is to maintain the following blood glucose levels:  Before meals (preprandial): 80-130 mg/dL (4.4-7.2 mmol/L).  After meals (postprandial): below 180 mg/dL (10 mmol/L).  A1c level: less than 7%. Follow these instructions at home: Questions to ask your health care provider  Consider asking the following questions: ? Do I need to meet with a diabetes educator? ? Where can I find a support group for people with diabetes? ? What equipment will I need to manage my diabetes at home? ? What diabetes medicines do I need, and when should I take them? ? How often do I need to check my blood glucose? ? What number can I call if I have questions? ? When is my next appointment? General instructions  Take over-the-counter and prescription medicines only as told by your health care provider.  Keep all follow-up visits as told by your health care provider. This is important.  For more information about diabetes, visit: ? American Diabetes Association (ADA): www.diabetes.org ? American Association of Diabetes Educators (AADE): www.diabeteseducator.org Contact a health care provider if:  Your blood glucose is at or above 240 mg/dL (13.3 mmol/L) for 2 days in a row.  You have been sick or have had a fever for 2 days or longer, and you are not getting better.  You have any of the following problems for more than 6 hours: ? You cannot eat or drink. ? You have nausea and vomiting. ? You have diarrhea. Get help right away if:  Your blood glucose is lower than 54 mg/dL (3.0 mmol/L).  You become confused or you have trouble thinking clearly.  You have difficulty breathing.  You have moderate or large ketone levels in your urine. Summary  Type 2 diabetes (type 2 diabetes mellitus) is a long-term (chronic) disease. In type 2 diabetes, the pancreas does not make enough of a  hormone called insulin, or cells in the body do not respond properly to insulin that the body makes (insulin resistance).  This condition is treated by making diet and lifestyle changes and taking diabetes medicines or insulin.  Your health care provider will set individualized treatment goals for you. Your goals will be based on your age, other medical conditions you have, and how you respond to diabetes treatment.  Keep all follow-up visits as told by your health care provider. This is important. This information is not intended to replace advice given to you by your health care provider. Make sure you discuss any questions you have with your health care provider. Document Released: 02/10/2005 Document Revised: 04/10/2017 Document Reviewed: 03/16/2015 Elsevier Patient Education  2020 Elsevier Inc.  

## 2018-10-12 ENCOUNTER — Encounter: Payer: Self-pay | Admitting: Internal Medicine

## 2018-10-14 ENCOUNTER — Other Ambulatory Visit: Payer: Self-pay

## 2018-10-14 ENCOUNTER — Encounter (INDEPENDENT_AMBULATORY_CARE_PROVIDER_SITE_OTHER): Payer: Medicare HMO | Admitting: Ophthalmology

## 2018-10-14 DIAGNOSIS — E11319 Type 2 diabetes mellitus with unspecified diabetic retinopathy without macular edema: Secondary | ICD-10-CM

## 2018-10-14 DIAGNOSIS — H43813 Vitreous degeneration, bilateral: Secondary | ICD-10-CM | POA: Diagnosis not present

## 2018-10-14 DIAGNOSIS — E113293 Type 2 diabetes mellitus with mild nonproliferative diabetic retinopathy without macular edema, bilateral: Secondary | ICD-10-CM

## 2018-10-14 LAB — HM DIABETES EYE EXAM

## 2018-10-15 ENCOUNTER — Encounter: Payer: Self-pay | Admitting: Internal Medicine

## 2018-10-15 ENCOUNTER — Other Ambulatory Visit: Payer: Self-pay | Admitting: Internal Medicine

## 2018-10-15 DIAGNOSIS — I2510 Atherosclerotic heart disease of native coronary artery without angina pectoris: Secondary | ICD-10-CM

## 2018-10-15 DIAGNOSIS — I10 Essential (primary) hypertension: Secondary | ICD-10-CM

## 2018-10-20 ENCOUNTER — Other Ambulatory Visit: Payer: Self-pay | Admitting: Internal Medicine

## 2018-10-20 ENCOUNTER — Telehealth: Payer: Self-pay | Admitting: Internal Medicine

## 2018-10-20 DIAGNOSIS — E1149 Type 2 diabetes mellitus with other diabetic neurological complication: Secondary | ICD-10-CM

## 2018-10-20 DIAGNOSIS — I10 Essential (primary) hypertension: Secondary | ICD-10-CM

## 2018-10-20 MED ORDER — IRBESARTAN-HYDROCHLOROTHIAZIDE 300-12.5 MG PO TABS: 1.0000 | ORAL_TABLET | Freq: Every day | ORAL | 1 refills | Status: DC

## 2018-10-20 NOTE — Telephone Encounter (Signed)
Pt seems to have misplaced the remaining pills of irbesartan-hydrochlorothiazide (AVALIDE) 300-12.5 MG tablet  And will need a refill before the 9.12.20 date. Pt only has 4 pills left. Spoke with pharmacy and they advised an Rx can be sent over for them to refill for the Pt since he is due soon. / Please advise

## 2018-10-20 NOTE — Telephone Encounter (Signed)
Can you send a refill of the irbesartan-HCTZ?   LOV was 10/11/2018

## 2018-10-22 DIAGNOSIS — R69 Illness, unspecified: Secondary | ICD-10-CM | POA: Diagnosis not present

## 2018-11-24 ENCOUNTER — Other Ambulatory Visit: Payer: Self-pay | Admitting: Internal Medicine

## 2018-11-24 DIAGNOSIS — E785 Hyperlipidemia, unspecified: Secondary | ICD-10-CM

## 2018-12-27 ENCOUNTER — Other Ambulatory Visit: Payer: Self-pay | Admitting: Internal Medicine

## 2018-12-27 DIAGNOSIS — E1149 Type 2 diabetes mellitus with other diabetic neurological complication: Secondary | ICD-10-CM

## 2018-12-27 MED ORDER — METFORMIN HCL 500 MG PO TABS: 1000.0000 mg | ORAL_TABLET | Freq: Two times a day (BID) | ORAL | 1 refills | Status: DC

## 2018-12-27 NOTE — Telephone Encounter (Signed)
Pt daughter is calling and he dad needs refill metformin 500 mg #360 for 90 day supply. [facility] [address]

## 2019-01-18 ENCOUNTER — Other Ambulatory Visit: Payer: Self-pay | Admitting: Internal Medicine

## 2019-01-18 DIAGNOSIS — I2510 Atherosclerotic heart disease of native coronary artery without angina pectoris: Secondary | ICD-10-CM

## 2019-01-18 DIAGNOSIS — I10 Essential (primary) hypertension: Secondary | ICD-10-CM

## 2019-01-18 NOTE — Telephone Encounter (Signed)
Patient daughter called checking status of medication  Call back [phone]

## 2019-01-18 NOTE — Telephone Encounter (Signed)
Routing to CMA 

## 2019-02-08 DIAGNOSIS — Z7984 Long term (current) use of oral hypoglycemic drugs: Secondary | ICD-10-CM | POA: Diagnosis not present

## 2019-02-08 DIAGNOSIS — Z008 Encounter for other general examination: Secondary | ICD-10-CM | POA: Diagnosis not present

## 2019-02-08 DIAGNOSIS — N529 Male erectile dysfunction, unspecified: Secondary | ICD-10-CM | POA: Diagnosis not present

## 2019-02-08 DIAGNOSIS — I25119 Atherosclerotic heart disease of native coronary artery with unspecified angina pectoris: Secondary | ICD-10-CM | POA: Diagnosis not present

## 2019-02-08 DIAGNOSIS — E785 Hyperlipidemia, unspecified: Secondary | ICD-10-CM | POA: Diagnosis not present

## 2019-02-08 DIAGNOSIS — K219 Gastro-esophageal reflux disease without esophagitis: Secondary | ICD-10-CM | POA: Diagnosis not present

## 2019-02-08 DIAGNOSIS — Z8249 Family history of ischemic heart disease and other diseases of the circulatory system: Secondary | ICD-10-CM | POA: Diagnosis not present

## 2019-02-08 DIAGNOSIS — E119 Type 2 diabetes mellitus without complications: Secondary | ICD-10-CM | POA: Diagnosis not present

## 2019-02-08 DIAGNOSIS — Z7982 Long term (current) use of aspirin: Secondary | ICD-10-CM | POA: Diagnosis not present

## 2019-02-08 DIAGNOSIS — I10 Essential (primary) hypertension: Secondary | ICD-10-CM | POA: Diagnosis not present

## 2019-02-08 DIAGNOSIS — I252 Old myocardial infarction: Secondary | ICD-10-CM | POA: Diagnosis not present

## 2019-03-07 ENCOUNTER — Telehealth: Payer: Self-pay | Admitting: Cardiology

## 2019-03-07 NOTE — Telephone Encounter (Signed)
LMTCB with pts daughter, Ms. Elnoria Howard

## 2019-03-07 NOTE — Telephone Encounter (Signed)
Pt's daughter called in very concerned regarding her father. Pt has been dizzy for the past several days and feels as if he is going to faint. Denies SOB/CP/ & N/V. Pt denies med or diet changes. Pt has been eating and drinking normally. He has not had any other symptoms to date.   Recent BP 130/64 with HR of 72 bpm. Pts daughter states that her father has never had this issue and is concerned it is due to his medications. She prefers that her father be seen in office as soon as possible to get this addressed.   Appt made with [facility] 03/08/2019 at 10:40am.

## 2019-03-07 NOTE — Telephone Encounter (Signed)
STAT if patient feels like he/she is going to faint   1) Are you dizzy now? Has been persistent for the past 3 days patient's daughter states.   2) Do you feel faint or have you passed out? No   3) Do you have any other symptoms? Light headedness past 3 days. Sits and stands up slowly. Has been eating and drinking as normal. She is thinking it might be do to his medication.   4) Have you checked your HR and BP (record if available)? About 130/64   HR 72  She is open to doing a virtual visit.

## 2019-03-08 ENCOUNTER — Encounter: Payer: Self-pay | Admitting: Cardiology

## 2019-03-08 ENCOUNTER — Ambulatory Visit (INDEPENDENT_AMBULATORY_CARE_PROVIDER_SITE_OTHER): Payer: Medicare HMO | Admitting: Cardiology

## 2019-03-08 ENCOUNTER — Other Ambulatory Visit: Payer: Self-pay

## 2019-03-08 ENCOUNTER — Ambulatory Visit (INDEPENDENT_AMBULATORY_CARE_PROVIDER_SITE_OTHER): Payer: Medicare HMO | Admitting: Internal Medicine

## 2019-03-08 ENCOUNTER — Encounter: Payer: Self-pay | Admitting: Internal Medicine

## 2019-03-08 VITALS — BP 100/48 | HR 77 | Temp 98.7°F | Ht 70.0 in | Wt 180.0 lb

## 2019-03-08 VITALS — BP 110/60 | HR 69 | Ht 70.0 in | Wt 180.0 lb

## 2019-03-08 DIAGNOSIS — K7581 Nonalcoholic steatohepatitis (NASH): Secondary | ICD-10-CM | POA: Diagnosis not present

## 2019-03-08 DIAGNOSIS — I10 Essential (primary) hypertension: Secondary | ICD-10-CM | POA: Diagnosis not present

## 2019-03-08 DIAGNOSIS — R42 Dizziness and giddiness: Secondary | ICD-10-CM

## 2019-03-08 DIAGNOSIS — R270 Ataxia, unspecified: Secondary | ICD-10-CM

## 2019-03-08 DIAGNOSIS — Z0001 Encounter for general adult medical examination with abnormal findings: Secondary | ICD-10-CM

## 2019-03-08 DIAGNOSIS — G4452 New daily persistent headache (NDPH): Secondary | ICD-10-CM

## 2019-03-08 DIAGNOSIS — H8113 Benign paroxysmal vertigo, bilateral: Secondary | ICD-10-CM | POA: Diagnosis not present

## 2019-03-08 DIAGNOSIS — E1149 Type 2 diabetes mellitus with other diabetic neurological complication: Secondary | ICD-10-CM

## 2019-03-08 DIAGNOSIS — Z0000 Encounter for general adult medical examination without abnormal findings: Secondary | ICD-10-CM

## 2019-03-08 DIAGNOSIS — E785 Hyperlipidemia, unspecified: Secondary | ICD-10-CM

## 2019-03-08 DIAGNOSIS — K2100 Gastro-esophageal reflux disease with esophagitis, without bleeding: Secondary | ICD-10-CM | POA: Diagnosis not present

## 2019-03-08 DIAGNOSIS — I2510 Atherosclerotic heart disease of native coronary artery without angina pectoris: Secondary | ICD-10-CM | POA: Diagnosis not present

## 2019-03-08 LAB — BASIC METABOLIC PANEL
BUN: 29 mg/dL — ABNORMAL HIGH (ref 6–23)
CO2: 32 mEq/L (ref 19–32)
Calcium: 10 mg/dL (ref 8.4–10.5)
Chloride: 96 mEq/L (ref 96–112)
Creatinine, Ser: 1.03 mg/dL (ref 0.40–1.50)
GFR: 68.02 mL/min (ref >60.00)
Glucose, Bld: 129 mg/dL — ABNORMAL HIGH (ref 70–99)
Potassium: 4.2 mEq/L (ref 3.5–5.1)
Sodium: 137 mEq/L (ref 135–145)

## 2019-03-08 LAB — LIPID PANEL
Cholesterol: 153 mg/dL (ref 0–200)
HDL: 45.2 mg/dL (ref >39.00)
LDL Cholesterol: 77 mg/dL (ref 0–99)
NonHDL: 107.35
Total CHOL/HDL Ratio: 3
Triglycerides: 153 mg/dL — ABNORMAL HIGH (ref 0.0–149.0)
VLDL: 30.6 mg/dL (ref 0.0–40.0)

## 2019-03-08 LAB — CBC WITH DIFFERENTIAL/PLATELET
Basophils Absolute: 0.1 10*3/uL (ref 0.0–0.1)
Basophils Relative: 1.2 % (ref 0.0–3.0)
Eosinophils Absolute: 0.4 10*3/uL (ref 0.0–0.7)
Eosinophils Relative: 6.7 % — ABNORMAL HIGH (ref 0.0–5.0)
HCT: 42.9 % (ref 39.0–52.0)
Hemoglobin: 13.8 g/dL (ref 13.0–17.0)
Lymphocytes Relative: 23.1 % (ref 12.0–46.0)
Lymphs Abs: 1.4 10*3/uL (ref 0.7–4.0)
MCHC: 32.2 g/dL (ref 30.0–36.0)
MCV: 96.3 fl (ref 78.0–100.0)
Monocytes Absolute: 0.6 10*3/uL (ref 0.1–1.0)
Monocytes Relative: 9.5 % (ref 3.0–12.0)
Neutro Abs: 3.6 10*3/uL (ref 1.4–7.7)
Neutrophils Relative %: 59.5 % (ref 43.0–77.0)
Platelets: 181 10*3/uL (ref 150.0–400.0)
RBC: 4.45 Mil/uL (ref 4.22–5.81)
RDW: 13.9 % (ref 11.5–15.5)
WBC: 6.1 10*3/uL (ref 4.0–10.5)

## 2019-03-08 LAB — HEPATIC FUNCTION PANEL
ALT: 53 U/L (ref 0–53)
AST: 47 U/L — ABNORMAL HIGH (ref 0–37)
Albumin: 4.3 g/dL (ref 3.5–5.2)
Alkaline Phosphatase: 111 U/L (ref 39–117)
Bilirubin, Direct: 0.1 mg/dL (ref 0.0–0.3)
Total Bilirubin: 0.5 mg/dL (ref 0.2–1.2)
Total Protein: 7.7 g/dL (ref 6.0–8.3)

## 2019-03-08 LAB — TSH: TSH: 2.2 u[IU]/mL (ref 0.35–4.50)

## 2019-03-08 LAB — MICROALBUMIN / CREATININE URINE RATIO
Creatinine,U: 102.6 mg/dL
Microalb Creat Ratio: 1.1 mg/g (ref 0.0–30.0)
Microalb, Ur: 1.2 mg/dL (ref 0.0–1.9)

## 2019-03-08 LAB — PROTIME-INR
INR: 1.3 ratio — ABNORMAL HIGH (ref 0.8–1.0)
Prothrombin Time: 15.1 s — ABNORMAL HIGH (ref 9.6–13.1)

## 2019-03-08 LAB — HEMOGLOBIN A1C: Hgb A1c MFr Bld: 6.5 % (ref 4.6–6.5)

## 2019-03-08 MED ORDER — MECLIZINE HCL 12.5 MG PO TABS: 12.5000 mg | ORAL_TABLET | Freq: Three times a day (TID) | ORAL | 3 refills | Status: DC | PRN

## 2019-03-08 MED ORDER — IRBESARTAN 150 MG PO TABS: 150.0000 mg | ORAL_TABLET | Freq: Every day | ORAL | 3 refills | Status: DC

## 2019-03-08 NOTE — Patient Instructions (Signed)
Vertigo Vertigo is the feeling that you or your surroundings are moving when they are not. This feeling can come and go at any time. Vertigo often goes away on its own. Vertigo can be dangerous if it occurs while you are doing something that could endanger you or others, such as driving or operating machinery. Your health care provider will do tests to determine the cause of your vertigo. Tests will also help your health care provider decide how best to treat your condition. Follow these instructions at home: Eating and drinking      Drink enough fluid to keep your urine pale yellow.  Do not drink alcohol. Activity  Return to your normal activities as told by your health care provider. Ask your health care provider what activities are safe for you.  In the morning, first sit up on the side of the bed. When you feel okay, stand slowly while you hold onto something until you know that your balance is fine.  Move slowly. Avoid sudden body or head movements or certain positions, as told by your health care provider.  If you have trouble walking or keeping your balance, try using a cane for stability. If you feel dizzy or unstable, sit down right away.  Avoid doing any tasks that would cause danger to you or others if vertigo occurs.  Avoid bending down if you feel dizzy. Place items in your home so that they are easy for you to reach without leaning over.  Do not drive or use heavy machinery if you feel dizzy. General instructions  Take over-the-counter and prescription medicines only as told by your health care provider.  Keep all follow-up visits as told by your health care provider. This is important. Contact a health care provider if:  Your medicines do not relieve your vertigo or they make it worse.  You have a fever.  Your condition gets worse or you develop new symptoms.  Your family or friends notice any behavioral changes.  Your nausea or vomiting gets worse.  You  have numbness or a prickling and tingling sensation in part of your body. Get help right away if you:  Have difficulty moving or speaking.  Are always dizzy.  Faint.  Develop severe headaches.  Have weakness in your hands, arms, or legs.  Have changes in your hearing or vision.  Develop a stiff neck.  Develop sensitivity to light. Summary  Vertigo is the feeling that you or your surroundings are moving when they are not.  Your health care provider will do tests to determine the cause of your vertigo.  Follow instructions for home care. You may be told to avoid certain tasks, positions, or movements.  Contact a health care provider if your medicines do not relieve your symptoms, or if you have a fever, nausea, vomiting, or changes in behavior.  Get help right away if you have severe headaches or difficulty speaking, or you develop hearing or vision problems. This information is not intended to replace advice given to you by your health care provider. Make sure you discuss any questions you have with your health care provider. Document Revised: 01/04/2018 Document Reviewed: 01/04/2018 Elsevier Patient Education  2020 [employer].

## 2019-03-08 NOTE — Patient Instructions (Signed)
Medication Instructions:  Please discontinue your Irbesartan/HCTZ and start Irbesartan 150 mg a day.  Continue all other medications as listed.  *If you need a refill on your cardiac medications before your next appointment, please call your pharmacy*  Follow-Up: At [facility], you and your health needs are our priority.  As part of our continuing mission to provide you with exceptional heart care, we have created designated Provider Care Teams.  These Care Teams include your primary Cardiologist (physician) and Advanced Practice Providers (APPs -  Physician Assistants and Nurse Practitioners) who all work together to provide you with the care you need, when you need it.  Your next appointment:   6 month(s)  The format for your next appointment:   In Person  Provider:   Kathyrn Drown, NP    Thank you for choosing [facility]!!

## 2019-03-08 NOTE — Progress Notes (Addendum)
Cardiology Office Note:    Date:  03/08/2019   ID:  Manuel [name], DOB 03-Jan-1929, MRN [id number]  PCP:  [name], MD  Cardiologist:  [name], MD     Referring MD: [name], MD     History of Present Illness:    Manuel [name] is a 86 y.o. male former patient of Dr. Claris Gladden with history of CAD status post myocardial infarction in January 2011 with diabetes hypertension hyperlipidemia here for follow-up and evaluation of recent dizziness.  Per phone call on 03/07/2019 his daughter called very concerned that he has been dizzy over the past several days and feels as if he is going to faint.  No chest pain shortness of breath nausea or vomiting.  No medicine changes.  Eating and drinking normally.  Blood pressure recently was 120/64 at prior visit with Dr. Ronnald Ramp 10/11/2018.  I spoke to Kathlee Nations, his daughter on the phone [phone] and she assisted with further historical input.  Gets dizzy when bending over.  Orthostatics today laying down 104/66, sitting 103/69, standing 104/68 heart rate 68, 74, 85, felt dizzy.  Got out of bed last froday - going in circles, had vertigo before (5 years ago). Getting a bit better. Head feels funny.   Daughter is concerned that this may be secondary to medications.  Previous visit history: He developed bilateral shoulder pain as angina in 2011 and was found to have MI. Heart catheterization showed 99% mid circumflex which is treated with DES. EF was normal. In [place].   Nuclear stress test in February 2015 showed no ischemia.  Overall he is doing quite well. Still walking. Golf. Occasional atypical chest discomfort.  06/30/17 - [place] bags, big, heavy. Last one heart pain. NTG sat down and went away. None since then. [place], walks 5 days a week, one mile. Still golfing. Chair exercise at church.  He does have some seasonal allergies which results in mild wheezing.  No bleeding, no syncope.  He knows to get up slowly from the bed.   Daughter with him again today.  Past Medical History:  Diagnosis Date  . Allergic rhinitis    childhood  . Asthma    Allergic component  . CAD (coronary artery disease)    s/p MI in 1/11. LHC (1/11) with 99% mCFX treated with 2.5 x 16 Xience V DES; 50% mLAD; EF 55%. ETT-myoview (10/12): 4'51", no significant ST segment changes, EF 56%, no ischemia or infarction.  . 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 Chronic kidney disease    stones  . Diabetes mellitus   . Dizziness    Holter (10/12): Frequent PACs and PVCs. No significant bradycardia.   [name] GERD (gastroesophageal reflux disease)   . Hx of cardiovascular stress test    ETT-Myoview (03/2013):  Inf defect on short axis images only (not felt to be significant), EF 68%; low risk.  [name] Hyperlipidemia   . Hypertension   . MI (myocardial infarction) ([place]) 02/2009  . Pneumonia    age 28   PMH: 1. Asthma: Allergic component 2. CAD: s/p MI in 1/11.  LHC (1/11) with 99% mCFX treated with 2.5 x 16 Xience V DES; 50% mLAD; EF 55%.  ETT-myoview (10/12): 4'51", no significant ST segment changes, EF 56%, no ischemia or infarction. ETT-Cardiolite (2/15): 4'15", probably normal with no ischemia/infarction, EF 68%.  3. GERD 4. Appendectomy 5. HTN 6. Hyperlipidemia 7. Diabetes mellitus type II 8. Lightheaded spells: Holter (  10/12): Frequent PACs and PVCs.  No significant bradycardia.  Probably due primarily to BPPV.    FH: Parents lived into their 43s.  No premature CAD.    SH: Moved from [place], [place] to [place].  Married, originally from [place].  Now living in daughter's house.  Quit smoking 1990, retired Hotel manager, occasional smoker.    Past Surgical History:  Procedure Laterality Date  . APPENDECTOMY    . CORONARY STENT PLACEMENT      Current Medications: Current Meds  Medication Sig  . aspirin EC 81 MG EC tablet Take 1 tablet (81 mg total) by mouth daily.  [name] atorvastatin (LIPITOR) 40 MG tablet TAKE 1 TABLET BY MOUTH EVERY DAY    . beclomethasone (QVAR) 40 MCG/ACT inhaler Inhale 2 puffs into the lungs 2 (two) times daily.  . Blood Glucose Monitoring Suppl ([place]) w/Device KIT 1 Device by Does not apply route daily. Use to check blood sugars daily Dx E11.9  . esomeprazole (NEXIUM) 40 MG capsule TAKE 1 CAPSULE BY MOUTH EVERY DAY  . glucose blood (ONETOUCH VERIO) test strip USE TO CHECK BLOOD SUGARS TWICE A DAY  . metFORMIN (GLUCOPHAGE) 500 MG tablet Take 2 tablets (1,000 mg total) by mouth 2 (two) times daily with a meal.  . metoprolol tartrate (LOPRESSOR) 25 MG tablet TAKE 1/2 TABLET BY MOUTH TWICE DAILY  . Multiple Vitamin (MULTIVITAMIN) tablet Take 1 tablet by mouth daily.  . nitroGLYCERIN (NITROSTAT) 0.4 MG SL tablet Place 1 tablet (0.4 mg total) under the tongue every 5 (five) minutes as needed for chest pain.  [name] omeprazole (PRILOSEC) 40 MG capsule TAKE 1 CAPSULE BY MOUTH EVERY DAY  . ONETOUCH DELICA LANCETS 15V MISC Use to help check blood sugars twice a day Dx E11.9  . [DISCONTINUED] irbesartan-hydrochlorothiazide (AVALIDE) 300-12.5 MG tablet Take 1 tablet by mouth daily.     Allergies:   Other   Social History   Socioeconomic History  . Marital status: Single    Spouse name: Not on file  . Number of children: Not on file  . Years of education: Not on file  . Highest education level: Not on file  Occupational History  . Occupation: retired  Tobacco Use  . Smoking status: Former Smoker    Types: Cigarettes    Quit date: 06/09/1966    Years since quitting: 52.7  . Smokeless tobacco: Never Used  Substance and Sexual Activity  . Alcohol use: No    Alcohol/week: 0.0 standard drinks  . Drug use: No  . Sexual activity: Not Currently  Other Topics Concern  . Not on file  Social History Narrative   Associates degree.    Social Determinants of Health   Financial Resource Strain:   . Difficulty of Paying Living Expenses: Not on file  Food Insecurity:   . Worried About Ship broker in the Last Year: Not on file  . Ran Out of Food in the Last Year: Not on file  Transportation Needs:   . Lack of Transportation (Medical): Not on file  . Lack of Transportation (Non-Medical): Not on file  Physical Activity:   . Days of Exercise per Week: Not on file  . Minutes of Exercise per Session: Not on file  Stress:   . Feeling of Stress : Not on file  Social Connections:   . Frequency of Communication with Friends and Family: Not on file  . Frequency of Social Gatherings with Friends and Family: Not on file  .  Attends Religious Services: Not on file  . Active Member of Clubs or Organizations: Not on file  . Attends Archivist Meetings: Not on file  . Marital Status: Not on file     family history includes Asthma in his father; Diabetes in his mother; Heart attack in his father; Heart failure in his mother. There is no history of Cancer. ROS:   Please see the history of present illness.     All other systems reviewed and are negative.   EKGs/Labs/Other Studies Reviewed:    EKG:  EKG is  ordered today.  The ekg ordered today demonstrates 06/30/2017-heart rate 61 sinus rhythm with first-degree AV block 216 ms, borderline, nonspecific ST-T wave flattening.  Personally viewed-prior 06/25/16-sinus rhythm first degree AV block PR interval 214 ms with nonspecific T-wave flattening personally viewed, left axis deviation.  Recent Labs: 04/15/2018: Hemoglobin 14.7; Platelets 167.0 10/11/2018: ALT 57; BUN 27; Creatinine, Ser 1.10; Potassium 4.7; Sodium 138   Recent Lipid Panel    Component Value Date/Time   CHOL 158 02/09/2018 0944   TRIG 117.0 02/09/2018 0944   HDL 47.20 02/09/2018 0944   CHOLHDL 3 02/09/2018 0944   VLDL 23.4 02/09/2018 0944   LDLCALC 87 02/09/2018 0944    Physical Exam:    VS:  BP 110/60   Pulse 69   Ht _0  (1.778 m)   Wt 180 lb (81.6 kg)   SpO2 93%   BMI 25.83 kg/m     Wt Readings from Last 3 Encounters:  03/08/19 180 lb (81.6 kg)   10/11/18 182 lb (82.6 kg)  07/20/18 175 lb (79.4 kg)     GEN: Well nourished, well developed, in no acute distress  HEENT: normal  Neck: no JVD, carotid bruits, or masses Cardiac: RRR; no murmurs, rubs, or gallops,no edema  Respiratory:  clear to auscultation bilaterally, normal work of breathing GI: soft, nontender, nondistended, + BS MS: no deformity or atrophy  Skin: warm and dry, no rash, varicose left leg Neuro:  Alert and Oriented x 3, Strength and sensation are intact Psych: euthymic mood, full affect   ASSESSMENT:    1. Coronary artery disease without angina pectoris, unspecified vessel or lesion type, unspecified whether native or transplanted heart   2. Dizziness   3. Vertigo    PLAN:    In order of problems listed above:  Dizziness/near syncope/vertigo -Blood pressure at home 194 systolic.  Daughter has been performing Epley maneuver on him at home.  He has had vertigo in the past.  The movement sensation certainly sounds like vertigo.  However he does have fairly low blood pressure currently and I would like to pull back on his medications as below under essential hypertension.  I also encouraged him to touch base with Dr. Ronnald Ramp his PCP.  Coronary artery disease  - Stable, post circumflex DES 2011. Doing well, no anginal symptoms.  - Continue current medications as above. Currently his angina is well controlled.  One isolated episode of possible angina when carrying heavy load.  None since.  Walking almost daily.  Doing very well.    -I do not think that his current symptoms are result of worsening coronary disease.  Last stress test was overall reassuring.  No angina.   Essential hypertension  -Blood pressure was quite soft today 103/69, heart rate did increase suggestive of potentially mild orthostatics.  We will go ahead and stop his HCTZ 12.5 component of medication and decrease his irbesartan to 150 mg a  day.  This may be contributing somewhat to his  symptoms.  Hyperlipidemia  - Excellent lipids, statin. No changes made. Dr. Ronnald Ramp has been following labs.  LDL 66, no changes made.  Elevated liver enzymes  -AST and ALT are both mildly elevated.  They have been like this for years.  In fact his daughter also has the same profile.  Since they are not 3 times the upper limit of normal, ALT, I would go ahead and continue with his statin therapy.  No changes made.  First-degree AV block  -Very mild, no changes.  Comfortable with continuing current low-dose metoprolol.  In fact today's EKG shows normal PR interval 208.  6 months APP, 12 months me   Medication Adjustments/Labs and Tests Ordered: Current medicines are reviewed at length with the patient today.  Concerns regarding medicines are outlined above. Labs and tests ordered and medication changes are outlined in the patient instructions below:  Patient Instructions  Medication Instructions:  Please discontinue your Irbesartan/HCTZ and start Irbesartan 150 mg a day.  Continue all other medications as listed.  *If you need a refill on your cardiac medications before your next appointment, please call your pharmacy*  Follow-Up: At [facility], you and your health needs are our priority.  As part of our continuing mission to provide you with exceptional heart care, we have created designated Provider Care Teams.  These Care Teams include your primary Cardiologist (physician) and Advanced Practice Providers (APPs -  Physician Assistants and Nurse Practitioners) who all work together to provide you with the care you need, when you need it.  Your next appointment:   6 month(s)  The format for your next appointment:   In Person  Provider:   Kathyrn Drown, NP    Thank you for choosing [facility]!!          Signed, [name], MD  03/08/2019 11:14 AM    [place]

## 2019-03-08 NOTE — Progress Notes (Signed)
Subjective:  Patient ID: Manuel [name], male    DOB: [birth year]  Age: 86 y.o. MRN: [id number]  CC: Annual Exam, Hyperlipidemia, Diabetes, and Gastroesophageal Reflux  This visit occurred during the SARS-CoV-2 public health emergency.  Safety protocols were in place, including screening questions prior to the visit, additional usage of staff PPE, and extensive cleaning of exam room while observing appropriate contact time as indicated for disinfecting solutions.    HPI Manuel [name] presents for a CPX.  He complains of a 4-day history of dizziness, vertigo, headache, and ataxia.  His daughter tells me he is using a walker to ambulate.  The vertigo is positional.  He saw his cardiologist earlier today and was found to be hypotensive so his diuretic has been discontinued.  He has had vertigo before but never this bad.  His EKG today earlier today with cardiology was within normal limits.  Outpatient Medications Prior to Visit  Medication Sig Dispense Refill  . aspirin EC 81 MG EC tablet Take 1 tablet (81 mg total) by mouth daily.    [name] atorvastatin (LIPITOR) 40 MG tablet TAKE 1 TABLET BY MOUTH EVERY DAY 90 tablet 1  . beclomethasone (QVAR) 40 MCG/ACT inhaler Inhale 2 puffs into the lungs 2 (two) times daily. 3 Inhaler 1  . Blood Glucose Monitoring Suppl (ONETOUCH VERIO FLEX SYSTEM) w/Device KIT 1 Device by Does not apply route daily. Use to check blood sugars daily Dx E11.9 1 kit 0  . esomeprazole (NEXIUM) 40 MG capsule TAKE 1 CAPSULE BY MOUTH EVERY DAY 90 capsule 1  . glucose blood (ONETOUCH VERIO) test strip USE TO CHECK BLOOD SUGARS TWICE A DAY 100 each 3  . irbesartan (AVAPRO) 150 MG tablet Take 1 tablet (150 mg total) by mouth daily. 90 tablet 3  . metFORMIN (GLUCOPHAGE) 500 MG tablet Take 2 tablets (1,000 mg total) by mouth 2 (two) times daily with a meal. 360 tablet 1  . metoprolol tartrate (LOPRESSOR) 25 MG tablet TAKE 1/2 TABLET BY MOUTH TWICE DAILY 90 tablet 1  . Multiple Vitamin  (MULTIVITAMIN) tablet Take 1 tablet by mouth daily.    . nitroGLYCERIN (NITROSTAT) 0.4 MG SL tablet Place 1 tablet (0.4 mg total) under the tongue every 5 (five) minutes as needed for chest pain. 10 tablet 3  . omeprazole (PRILOSEC) 40 MG capsule TAKE 1 CAPSULE BY MOUTH EVERY DAY 90 capsule 1  . ONETOUCH DELICA LANCETS 09O MISC Use to help check blood sugars twice a day Dx E11.9 100 each 3   No facility-administered medications prior to visit.    ROS Review of Systems  Constitutional: Negative for chills, diaphoresis, fatigue, fever and unexpected weight change.  HENT: Negative.  Negative for trouble swallowing and voice change.   Eyes: Negative for visual disturbance.  Respiratory: Negative for cough, chest tightness, shortness of breath and wheezing.   Cardiovascular: Negative for chest pain, palpitations and leg swelling.  Gastrointestinal: Negative for abdominal pain, constipation, diarrhea, nausea and vomiting.  Endocrine: Negative.   Genitourinary: Negative.  Negative for difficulty urinating and frequency.  Musculoskeletal: Positive for gait problem. Negative for back pain and neck pain.  Skin: Negative.   Neurological: Positive for dizziness, light-headedness and headaches. Negative for tremors, seizures, syncope, speech difficulty and weakness.  Hematological: Negative.   Psychiatric/Behavioral: Negative.     Objective:  BP (!) 100/48 (BP Location: Left Arm, Patient Position: Sitting, Cuff Size: Normal)   Pulse 77   Temp 98.7 F (37.1 C) (Oral)  Ht 5' 10"  (1.778 m)   Wt 180 lb (81.6 kg)   SpO2 93%   BMI 25.83 kg/m   BP Readings from Last 3 Encounters:  03/08/19 (!) 100/48  03/08/19 110/60  10/11/18 120/64    Wt Readings from Last 3 Encounters:  03/08/19 180 lb (81.6 kg)  03/08/19 180 lb (81.6 kg)  10/11/18 182 lb (82.6 kg)    Physical Exam Vitals reviewed.  Constitutional:      General: He is not in acute distress.    Appearance: He is not ill-appearing,  toxic-appearing or diaphoretic.  HENT:     Nose: Nose normal.     Mouth/Throat:     Mouth: Mucous membranes are moist.  Eyes:     General: No scleral icterus.    Extraocular Movements: Extraocular movements intact.     Conjunctiva/sclera: Conjunctivae normal.     Pupils: Pupils are equal, round, and reactive to light.  Cardiovascular:     Rate and Rhythm: Normal rate and regular rhythm.     Pulses: Normal pulses.     Heart sounds: No murmur.  Pulmonary:     Effort: Pulmonary effort is normal.     Breath sounds: No stridor. No wheezing, rhonchi or rales.  Abdominal:     General: Abdomen is flat.     Palpations: There is no mass.     Tenderness: There is no abdominal tenderness.  Musculoskeletal:        General: Normal range of motion.     Cervical back: Neck supple.  Lymphadenopathy:     Cervical: No cervical adenopathy.  Skin:    General: Skin is warm and dry.  Neurological:     General: No focal deficit present.     Mental Status: He is alert and oriented to person, place, and time.     Gait: Gait abnormal.  Psychiatric:        Mood and Affect: Mood normal.        Behavior: Behavior normal.        Thought Content: Thought content normal.        Judgment: Judgment normal.     Lab Results  Component Value Date   WBC 6.1 03/08/2019   HGB 13.8 03/08/2019   HCT 42.9 03/08/2019   PLT 181.0 03/08/2019   GLUCOSE 129 (H) 03/08/2019   CHOL 153 03/08/2019   TRIG 153.0 (H) 03/08/2019   HDL 45.20 03/08/2019   LDLCALC 77 03/08/2019   ALT 53 03/08/2019   AST 47 (H) 03/08/2019   NA 137 03/08/2019   K 4.2 03/08/2019   CL 96 03/08/2019   CREATININE 1.03 03/08/2019   BUN 29 (H) 03/08/2019   CO2 32 03/08/2019   TSH 2.20 03/08/2019   INR 1.3 (H) 03/08/2019   HGBA1C 6.5 03/08/2019   MICROALBUR 1.2 03/08/2019    US Abdomen Complete  Result Date: 04/15/2018 CLINICAL DATA:  RIGHT upper quadrant abdominal pain. EXAM: ABDOMEN ULTRASOUND COMPLETE COMPARISON:  None. FINDINGS:  Gallbladder: No gallstones or wall thickening visualized. No sonographic Murphy sign noted by sonographer. Common bile duct: Diameter: Normal at 4 mm Liver: Heterogeneous liver echotexture. No focal lesion. No duct dilatation. Portal vein is patent on color Doppler imaging with normal direction of blood flow towards the liver. IVC: No abnormality visualized. Pancreas: Visualized portion unremarkable. Spleen: Size and appearance within normal limits. Right Kidney: Length: 10.3 cm. 2 anechoic cysts in lower pole measuring 1.73.6 cm. Left Kidney: Length: 9.9 cm. Large anechoic cysts measuring 5  cm. Second smaller 1 mm anechoic cyst. Abdominal aorta: No aneurysm visualized. Other findings: None. IMPRESSION: 1. No evidence of cholecystitis.  No acute findings. 2. Benign appearing renal cysts. Electronically Signed   By: Suzy Bouchard M.D.   On: 04/15/2018 11:50    Assessment & Plan:   Manuel [name] was seen today for annual exam, hyperlipidemia, diabetes and gastroesophageal reflux.  Diagnoses and all orders for this visit:  Essential hypertension, benign- His blood pressure has been over controlled.  This was addressed earlier today by cardiology. -     CBC with Differential -     Basic metabolic panel -     TSH -     Urinalysis, Routine w reflex microscopic  Gastroesophageal reflux disease with esophagitis without hemorrhage- No complications or alarming features noted. -     CBC with Differential  Nonalcoholic steatohepatitis ([facility])- His LFTs are lower than they were before.  His MELD score is 9.  I do not expect his liver disease to affect him in the next 6 months. -     Hepatic function panel -     Protime-INR  Diabetes mellitus type 2 with neurological manifestations ([place])- His blood sugars are adequately well controlled. -     Basic metabolic panel -     Hemoglobin A1c -     Microalbumin / creatinine urine ratio  Hyperlipidemia with target LDL less than 100- He has achieved his LDL goal is  doing well on the statin. -     Lipid panel -     TSH  Routine general medical examination at a health care facility- Exam completed, labs reviewed, vaccines reviewed and updated, no cancer screenings are indicated, patient education material was given.  Benign paroxysmal positional vertigo due to bilateral vestibular disorder -     MR Brain Wo Contrast; Future -     meclizine (ANTIVERT) 12.5 MG tablet; Take 1 tablet (12.5 mg total) by mouth 3 (three) times daily as needed for dizziness.  New daily persistent headache -     MR Brain Wo Contrast; Future  Ataxia- He has new onset vertigo, headache, and ataxia.  I recommended that he undergo an MRI of the brain to see if there is been a CVA, bleed, mass, or tumor, or the development of NPH. -     MR Brain Wo Contrast; Future   I am having [name] start on meclizine. I am also having him maintain his aspirin EC, multivitamin, OneTouch Verio Flex System, OneTouch Delica Lancets 12W, glucose blood, nitroGLYCERIN, beclomethasone, omeprazole, esomeprazole, atorvastatin, metFORMIN, metoprolol tartrate, and irbesartan.  Meds ordered this encounter  Medications  . meclizine (ANTIVERT) 12.5 MG tablet    Sig: Take 1 tablet (12.5 mg total) by mouth 3 (three) times daily as needed for dizziness.    Dispense:  65 tablet    Refill:  3     Follow-up: Return in about 3 months (around 06/06/2019).  Scarlette Calico, MD

## 2019-03-09 ENCOUNTER — Telehealth: Payer: Self-pay

## 2019-03-09 ENCOUNTER — Encounter: Payer: Self-pay | Admitting: Internal Medicine

## 2019-03-09 LAB — URINALYSIS, ROUTINE W REFLEX MICROSCOPIC
Bilirubin Urine: NEGATIVE
Hgb urine dipstick: NEGATIVE
Ketones, ur: NEGATIVE
Leukocytes,Ua: NEGATIVE
Nitrite: NEGATIVE
RBC / HPF: NONE SEEN (ref 0–?)
Specific Gravity, Urine: 1.02 (ref 1.000–1.030)
Total Protein, Urine: NEGATIVE
Urine Glucose: NEGATIVE
Urobilinogen, UA: 0.2 (ref 0.0–1.0)
pH: 5.5 (ref 5.0–8.0)

## 2019-03-09 NOTE — Telephone Encounter (Signed)
Copied from [place] [id number]. Topic: General - Other >> Mar 09, 2019 11:56 AM Rayann Heman wrote: Reason for CRM: pt daughter Kathlee Nations called and stated that she would like a call back. Pt daughter would like to know if they should still be doing Eppley maneuver for vertigo. If so how many times a day with current medication. Kathlee Nations would like to know if we could leave her a message because she might not be able to answer. Please advise

## 2019-03-09 NOTE — Telephone Encounter (Signed)
Contacted pt, lvm on daughters phone instructing to continue the Eppley maneuver once a day.

## 2019-03-09 NOTE — Telephone Encounter (Signed)
Please advise if pt should continue the Eppley maneuver, if so, how many times per day.

## 2019-03-09 NOTE — Telephone Encounter (Signed)
Just once a day  TJ

## 2019-03-14 ENCOUNTER — Encounter: Payer: Self-pay | Admitting: Internal Medicine

## 2019-03-19 ENCOUNTER — Other Ambulatory Visit: Payer: Medicare HMO

## 2019-05-05 ENCOUNTER — Other Ambulatory Visit: Payer: Self-pay | Admitting: Internal Medicine

## 2019-05-05 DIAGNOSIS — E785 Hyperlipidemia, unspecified: Secondary | ICD-10-CM

## 2019-06-16 ENCOUNTER — Other Ambulatory Visit: Payer: Self-pay | Admitting: Internal Medicine

## 2019-06-16 DIAGNOSIS — E1149 Type 2 diabetes mellitus with other diabetic neurological complication: Secondary | ICD-10-CM

## 2019-06-28 ENCOUNTER — Other Ambulatory Visit: Payer: Self-pay | Admitting: Internal Medicine

## 2019-06-28 DIAGNOSIS — K2100 Gastro-esophageal reflux disease with esophagitis, without bleeding: Secondary | ICD-10-CM

## 2019-09-05 ENCOUNTER — Ambulatory Visit: Payer: Medicare HMO | Admitting: Cardiology

## 2019-09-05 ENCOUNTER — Encounter: Payer: Self-pay | Admitting: Cardiology

## 2019-09-05 ENCOUNTER — Other Ambulatory Visit: Payer: Self-pay

## 2019-09-05 VITALS — BP 132/60 | HR 62 | Ht 70.0 in | Wt 180.0 lb

## 2019-09-05 DIAGNOSIS — I2510 Atherosclerotic heart disease of native coronary artery without angina pectoris: Secondary | ICD-10-CM | POA: Diagnosis not present

## 2019-09-05 DIAGNOSIS — I10 Essential (primary) hypertension: Secondary | ICD-10-CM

## 2019-09-05 DIAGNOSIS — R42 Dizziness and giddiness: Secondary | ICD-10-CM

## 2019-09-05 DIAGNOSIS — E785 Hyperlipidemia, unspecified: Secondary | ICD-10-CM

## 2019-09-05 DIAGNOSIS — R748 Abnormal levels of other serum enzymes: Secondary | ICD-10-CM

## 2019-09-05 NOTE — Patient Instructions (Signed)

## 2019-09-05 NOTE — Progress Notes (Signed)
Cardiology Office Note:    Date:  09/05/2019   ID:  Manuel [name], DOB 1928-02-27, MRN [id number]  PCP:  [name], MD  [facility] Cardiologist:  [name], MD  [facility] Electrophysiologist:  None   Referring MD: [name], MD     History of Present Illness:    Manuel [name] is a 86 y.o. male here for CAD follow-up.  He is a former patient of Manuel [name] with CAD status post MI in January 2011 with concomitant diabetes hypertension hyperlipidemia and vertiginous symptoms.  Have been quite dizzy feeling like he was going to faint no changes in diet.  Lives his daughter has assisted with historical input.  At 1 point when getting out of bed felt like he was going in circles.  He has had prior vertigo.  Has been given physical therapy for Epley maneuver  Orthostatics have been done, 409 systolic, 811, 914 standing at prior visit.  Currently, his blood pressure improved at 132.  Feels better.  Vertigo has gone away.  Rarely has to take nitroglycerin with chest pain.  Overall continuing to move well.  Daughter here with him again today.  Has enjoyed walking [place] walks 5 days a week 1 mile.  Golfing.  1. Asthma: Allergic component 2. CAD: s/p MI in 1/11. LHC (1/11) with 99% mCFX treated with 2.5 x 16 Xience V DES; 50% mLAD; EF 55%. ETT-myoview (10/12): 4'51", no significant ST segment changes, EF 56%, no ischemia or infarction. ETT-Cardiolite (2/15): 4'15", probably normal with no ischemia/infarction, EF 68%.  3. GERD 4. Appendectomy 5. HTN 6. Hyperlipidemia 7. Diabetes mellitus type II 8. Lightheaded spells: Holter (10/12): Frequent PACs and PVCs. No significant bradycardia. Probably due primarily to BPPV.    FH: Parents lived into their 37s. No premature CAD.   SH: Moved from [place], [place] to [place]. Married, originally from [place]. Now living in daughter's house. Quit smoking 1990, retired Hotel manager, occasional  smoker.   Past Medical History:  Diagnosis Date  . Allergic rhinitis    childhood  . Asthma    Allergic component  . CAD (coronary artery disease)    s/p MI in 1/11. LHC (1/11) with 99% mCFX treated with 2.5 x 16 Xience V DES; 50% mLAD; EF 55%. ETT-myoview (10/12): 4'51", no significant ST segment changes, EF 56%, no ischemia or infarction.  . 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 Chronic kidney disease    stones  . Diabetes mellitus   . Dizziness    Holter (10/12): Frequent PACs and PVCs. No significant bradycardia.   [name] GERD (gastroesophageal reflux disease)   . Hx of cardiovascular stress test    ETT-Myoview (03/2013):  Inf defect on short axis images only (not felt to be significant), EF 68%; low risk.  [name] Hyperlipidemia   . Hypertension   . MI (myocardial infarction) ([place]) 02/2009  . Pneumonia    age 57    Past Surgical History:  Procedure Laterality Date  . APPENDECTOMY    . CORONARY STENT PLACEMENT      Current Medications: Current Meds  Medication Sig  . aspirin EC 81 MG EC tablet Take 1 tablet (81 mg total) by mouth daily.  [name] atorvastatin (LIPITOR) 40 MG tablet TAKE 1 TABLET BY MOUTH EVERY DAY  . beclomethasone (QVAR) 40 MCG/ACT inhaler Inhale 2 puffs into the lungs 2 (two) times daily.  . Blood Glucose Monitoring Suppl (ONETOUCH VERIO FLEX  SYSTEM) w/Device KIT 1 Device by Does not apply route daily. Use to check blood sugars daily Dx E11.9  . esomeprazole (NEXIUM) 40 MG capsule TAKE 1 CAPSULE BY MOUTH EVERY DAY  . glucose blood (ONETOUCH VERIO) test strip USE TO CHECK BLOOD SUGARS TWICE A DAY  . irbesartan (AVAPRO) 150 MG tablet Take 1 tablet (150 mg total) by mouth daily.  . meclizine (ANTIVERT) 12.5 MG tablet Take 1 tablet (12.5 mg total) by mouth 3 (three) times daily as needed for dizziness.  . metFORMIN (GLUCOPHAGE) 500 MG tablet TAKE 2 TABLETS BY MOUTH TWICE A DAY WITH A MEAL  . metoprolol tartrate (LOPRESSOR) 25 MG tablet TAKE 1/2 TABLET BY MOUTH TWICE DAILY    . Multiple Vitamin (MULTIVITAMIN) tablet Take 1 tablet by mouth daily.  . nitroGLYCERIN (NITROSTAT) 0.4 MG SL tablet Place 1 tablet (0.4 mg total) under the tongue every 5 (five) minutes as needed for chest pain.  [name] omeprazole (PRILOSEC) 40 MG capsule TAKE 1 CAPSULE BY MOUTH EVERY DAY  . ONETOUCH DELICA LANCETS 48G MISC Use to help check blood sugars twice a day Dx E11.9     Allergies:   Other   Social History   Socioeconomic History  . Marital status: Single    Spouse name: Not on file  . Number of children: Not on file  . Years of education: Not on file  . Highest education level: Not on file  Occupational History  . Occupation: retired  Tobacco Use  . Smoking status: Former Smoker    Types: Cigarettes    Quit date: 06/09/1966    Years since quitting: 53.2  . Smokeless tobacco: Never Used  Vaping Use  . Vaping Use: Never used  Substance and Sexual Activity  . Alcohol use: No    Alcohol/week: 0.0 standard drinks  . Drug use: No  . Sexual activity: Not Currently  Other Topics Concern  . Not on file  Social History Narrative   Associates degree.    Social Determinants of Health   Financial Resource Strain:   . Difficulty of Paying Living Expenses:   Food Insecurity:   . Worried About Charity fundraiser in the Last Year:   . Arboriculturist in the Last Year:   Transportation Needs:   . Film/video editor (Medical):   [name] Lack of Transportation (Non-Medical):   Physical Activity:   . Days of Exercise per Week:   . Minutes of Exercise per Session:   Stress:   . Feeling of Stress :   Social Connections:   . Frequency of Communication with Friends and Family:   . Frequency of Social Gatherings with Friends and Family:   . Attends Religious Services:   . Active Member of Clubs or Organizations:   . Attends Archivist Meetings:   [name] Marital Status:      Family History: The patient's family history includes Asthma in his father; Diabetes in his mother;  Heart attack in his father; Heart failure in his mother. There is no history of Cancer.  ROS:   Please see the history of present illness.    No syncope.  He takes his time when getting up from a seated position.  All other systems reviewed and are negative.  EKGs/Labs/Other Studies Reviewed:      EKG:   06/30/2017-heart rate 61 sinus rhythm with first-degree AV block 216 ms, borderline, nonspecific ST-T wave flattening.  Personally viewed-prior 06/25/16-sinus rhythm first degree AV block PR  interval 214 ms with nonspecific T-wave flattening personally viewed, left axis deviation Recent Labs: 03/08/2019: ALT 53; BUN 29; Creatinine, Ser 1.03; Hemoglobin 13.8; Platelets 181.0; Potassium 4.2; Sodium 137; TSH 2.20  Recent Lipid Panel    Component Value Date/Time   CHOL 153 03/08/2019 1444   TRIG 153.0 (H) 03/08/2019 1444   HDL 45.20 03/08/2019 1444   CHOLHDL 3 03/08/2019 1444   VLDL 30.6 03/08/2019 1444   LDLCALC 77 03/08/2019 1444    Physical Exam:    VS:  BP 132/60   Pulse 62   Ht 5' 10"  (1.778 m)   Wt 180 lb (81.6 kg)   SpO2 92%   BMI 25.83 kg/m     Wt Readings from Last 3 Encounters:  09/05/19 180 lb (81.6 kg)  03/08/19 180 lb (81.6 kg)  03/08/19 180 lb (81.6 kg)     GEN:  Well nourished, well developed in no acute distress, elderly  HEENT: Normal, hard of hearing NECK: No JVD; No carotid bruits LYMPHATICS: No lymphadenopathy CARDIAC: RRR, no murmurs, rubs, gallops RESPIRATORY:  Clear to auscultation without rales, wheezing or rhonchi  ABDOMEN: Soft, non-tender, non-distended MUSCULOSKELETAL:  No edema; No deformity  SKIN: Warm and dry NEUROLOGIC:  Alert and oriented x 3 PSYCHIATRIC:  Normal affect   ASSESSMENT:    1. Coronary artery disease without angina pectoris, unspecified vessel or lesion type, unspecified whether native or transplanted heart   2. Dizziness   3. Vertigo   4. Essential hypertension, benign   5. Hyperlipidemia with target LDL less than 100    6. Elevated liver enzymes    PLAN:    In order of problems listed above:  Vertigo/dizziness -Epley maneuver.  Fairly soft blood pressure at times.  At last visit encouraged him to pull back on his medications for hypertension.  He also touch base with Dr. Ronnald Ramp.  MRI of brain was done ordered by Dr. Ronnald Ramp, do not see that this was done.  CAD -Circumflex stent DES in 2011 stable with no anginal symptoms. Prior stress test reassuring.  Essential hypertension -Blood pressure fairly low at prior visit.  103/69.  We discontinued his HCTZ 12.5 and decreased his irbesartan to 150.  This was done at prior visit.  Hyperlipidemia -LDL 66 previously at goal.  At last check 77.  Continue with current atorvastatin 40 mg.  Dietary modifications.  No changes.  Elevated liver enzymes/nonalcoholic steatohepatitis -Both ALT and AST have been mildly elevated for years.  His daughter has the same profile.  Continue statin therapy.  These are not 3 times the upper limit of normal.  First-degree AV block -Very mild.  If blood pressure remains low, could consider discontinuation of metoprolol.  His CAD has been stable.    Medication Adjustments/Labs and Tests Ordered: Current medicines are reviewed at length with the patient today.  Concerns regarding medicines are outlined above.  No orders of the defined types were placed in this encounter.  No orders of the defined types were placed in this encounter.   Patient Instructions  Medication Instructions:  The current medical regimen is effective;  continue present plan and medications.  *If you need a refill on your cardiac medications before your next appointment, please call your pharmacy*  Follow-Up: At [facility], you and your health needs are our priority.  As part of our continuing mission to provide you with exceptional heart care, we have created designated Provider Care Teams.  These Care Teams include your primary Cardiologist  (physician)  and Advanced Practice Providers (APPs -  Physician Assistants and Nurse Practitioners) who all work together to provide you with the care you need, when you need it.  We recommend signing up for the patient portal called "MyChart".  Sign up information is provided on this After Visit Summary.  MyChart is used to connect with patients for Virtual Visits (Telemedicine).  Patients are able to view lab/test results, encounter notes, upcoming appointments, etc.  Non-urgent messages can be sent to your provider as well.   To learn more about what you can do with MyChart, go to [email or web address].    Your next appointment:   6 month(s)  The format for your next appointment:   In Person  Provider:   [name], MD   Thank you for choosing [facility]!!         Signed, [name], MD  09/05/2019 8:37 AM    [address]

## 2019-09-08 ENCOUNTER — Other Ambulatory Visit: Payer: Self-pay | Admitting: Internal Medicine

## 2019-09-08 DIAGNOSIS — E1149 Type 2 diabetes mellitus with other diabetic neurological complication: Secondary | ICD-10-CM

## 2019-09-14 ENCOUNTER — Encounter (INDEPENDENT_AMBULATORY_CARE_PROVIDER_SITE_OTHER): Payer: Self-pay | Admitting: Otolaryngology

## 2019-09-14 ENCOUNTER — Ambulatory Visit (INDEPENDENT_AMBULATORY_CARE_PROVIDER_SITE_OTHER): Payer: Medicare HMO | Admitting: Otolaryngology

## 2019-09-14 ENCOUNTER — Other Ambulatory Visit: Payer: Self-pay

## 2019-09-14 VITALS — Temp 96.8°F

## 2019-09-14 DIAGNOSIS — H6123 Impacted cerumen, bilateral: Secondary | ICD-10-CM | POA: Diagnosis not present

## 2019-09-14 NOTE — Progress Notes (Signed)
HPI: Manuel [name] is a 86 y.o. male who presents for evaluation of ear wax buildup referred by [facility].  He is scheduled to get a hearing test.  He has noted blockage of the right ear for several months.  But no pain..  Past Medical History:  Diagnosis Date  . Allergic rhinitis    childhood  . Asthma    Allergic component  . CAD (coronary artery disease)    s/p MI in 1/11. LHC (1/11) with 99% mCFX treated with 2.5 x 16 Xience V DES; 50% mLAD; EF 55%. ETT-myoview (10/12): 4'51", no significant ST segment changes, EF 56%, no ischemia or infarction.  . 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 Chronic kidney disease    stones  . Diabetes mellitus   . Dizziness    Holter (10/12): Frequent PACs and PVCs. No significant bradycardia.   [name] GERD (gastroesophageal reflux disease)   . Hx of cardiovascular stress test    ETT-Myoview (03/2013):  Inf defect on short axis images only (not felt to be significant), EF 68%; low risk.  [name] Hyperlipidemia   . Hypertension   . MI (myocardial infarction) ([place]) 02/2009  . Pneumonia    age 60   Past Surgical History:  Procedure Laterality Date  . APPENDECTOMY    . CORONARY STENT PLACEMENT     Social History   Socioeconomic History  . Marital status: Single    Spouse name: Not on file  . Number of children: Not on file  . Years of education: Not on file  . Highest education level: Not on file  Occupational History  . Occupation: retired  Tobacco Use  . Smoking status: Former Smoker    Types: Cigarettes    Quit date: 06/09/1966    Years since quitting: 53.3  . Smokeless tobacco: Never Used  Vaping Use  . Vaping Use: Never used  Substance and Sexual Activity  . Alcohol use: No    Alcohol/week: 0.0 standard drinks  . Drug use: No  . Sexual activity: Not Currently  Other Topics Concern  . Not on file  Social History Narrative   Associates degree.    Social Determinants of Health   Financial Resource Strain:   . Difficulty of Paying  Living Expenses:   Food Insecurity:   . Worried About Charity fundraiser in the Last Year:   . Arboriculturist in the Last Year:   Transportation Needs:   . Film/video editor (Medical):   [name] Lack of Transportation (Non-Medical):   Physical Activity:   . Days of Exercise per Week:   . Minutes of Exercise per Session:   Stress:   . Feeling of Stress :   Social Connections:   . Frequency of Communication with Friends and Family:   . Frequency of Social Gatherings with Friends and Family:   . Attends Religious Services:   . Active Member of Clubs or Organizations:   . Attends Archivist Meetings:   [name] Marital Status:    Family History  Problem Relation Age of Onset  . Diabetes Mother   . Heart failure Mother   . Heart attack Father   . Asthma Father   . Cancer Neg Hx    Allergies  Allergen Reactions  . Other     Cats; dust; environmental-itchy eyes, throat irritation   Prior to Admission medications   Medication Sig Start Date End Date Taking? Authorizing Provider  aspirin  EC 81 MG EC tablet Take 1 tablet (81 mg total) by mouth daily. 06/20/10  Yes McLean, Dalton S, MD  atorvastatin (LIPITOR) 40 MG tablet TAKE 1 TABLET BY MOUTH EVERY DAY 05/05/19  Yes Jones, Thomas L, MD  beclomethasone (QVAR) 40 MCG/ACT inhaler Inhale 2 puffs into the lungs 2 (two) times daily. 02/09/18  Yes Jones, Thomas L, MD  Blood Glucose Monitoring Suppl (ONETOUCH VERIO FLEX SYSTEM) w/Device KIT 1 Device by Does not apply route daily. Use to check blood sugars daily Dx E11.9 02/13/16  Yes Jones, Thomas L, MD  esomeprazole (NEXIUM) 40 MG capsule TAKE 1 CAPSULE BY MOUTH EVERY DAY 06/14/18  Yes Jones, Thomas L, MD  glucose blood (ONETOUCH VERIO) test strip USE TO CHECK BLOOD SUGARS TWICE A DAY 08/12/17  Yes Jones, Thomas L, MD  irbesartan (AVAPRO) 150 MG tablet Take 1 tablet (150 mg total) by mouth daily. 03/08/19  Yes Skains, Mark C, MD  meclizine (ANTIVERT) 12.5 MG tablet Take 1 tablet (12.5 mg  total) by mouth 3 (three) times daily as needed for dizziness. 03/08/19  Yes Jones, Thomas L, MD  metFORMIN (GLUCOPHAGE) 500 MG tablet TAKE 2 TABLETS BY MOUTH TWICE A DAY WITH A MEAL 06/16/19  Yes Jones, Thomas L, MD  metoprolol tartrate (LOPRESSOR) 25 MG tablet TAKE 1/2 TABLET BY MOUTH TWICE DAILY 01/18/19  Yes Jones, Thomas L, MD  Multiple Vitamin (MULTIVITAMIN) tablet Take 1 tablet by mouth daily.   Yes [provider]  nitroGLYCERIN (NITROSTAT) 0.4 MG SL tablet Place 1 tablet (0.4 mg total) under the tongue every 5 (five) minutes as needed for chest pain. 02/09/18  Yes Jones, Thomas L, MD  omeprazole (PRILOSEC) 40 MG capsule TAKE 1 CAPSULE BY MOUTH EVERY DAY 06/28/19  Yes Jones, Thomas L, MD  ONETOUCH DELICA LANCETS 33G MISC Use to help check blood sugars twice a day Dx E11.9 08/12/17  Yes Jones, Thomas L, MD     Positive ROS: Otherwise negative  All other systems have been reviewed and were otherwise negative with the exception of those mentioned in the HPI and as above.  Physical Exam: Constitutional: Alert, well-appearing, no acute distress Ears: External ears without lesions or tenderness. Ear canals left ear canal with minimal cerumen that was removed with a curette and forceps.  Right ear canal was completely occluded with hard cerumen that was removed with right angle hook suction and forceps.  Deep to the wax was some thickening of the ear canal skin as if it was obstructed.  I was able to clean all the wax out of the ear canal which was little bit smaller on the right side.  The TM had good mobility on pneumatic otoscopy.  His hearing was much better.. Nasal: External nose without lesions. Clear nasal passages Oral: Oropharynx clear. Neck: No palpable adenopathy or masses Respiratory: Breathing comfortably  Skin: No facial/neck lesions or rash noted.  Cerumen impaction removal  Date/Time: 09/14/2019 9:43 AM Performed by: Newman, Christopher E, MD Authorized by: Newman,  Christopher E, MD   Consent:    Consent obtained:  Verbal   Consent given by:  Patient   Risks discussed:  Pain and bleeding Procedure details:    Location:  L ear and R ear   Procedure type: curette, suction and forceps   Post-procedure details:    Inspection:  TM intact and canal normal   Hearing quality:  Improved   Patient tolerance of procedure:  Tolerated well, no immediate complications Comments:       Right ear canal is completely occluded.  Left ear canal with just minimal cerumen.  Right TM is a bit thickened but had good mobility on pneumatic otoscopy.  Left TM was clear.    Assessment: Severe right cerumen impaction minimal cerumen on the left side.  Plan: Ear canals were cleaned in the office.  He will follow-up with [facility] concerning obtaining hearing test and possible hearing aids  Radene Journey, MD

## 2019-09-18 ENCOUNTER — Other Ambulatory Visit: Payer: Self-pay | Admitting: Internal Medicine

## 2019-09-18 DIAGNOSIS — I2510 Atherosclerotic heart disease of native coronary artery without angina pectoris: Secondary | ICD-10-CM

## 2019-09-18 DIAGNOSIS — I10 Essential (primary) hypertension: Secondary | ICD-10-CM

## 2019-10-17 ENCOUNTER — Encounter (INDEPENDENT_AMBULATORY_CARE_PROVIDER_SITE_OTHER): Payer: Medicare HMO | Admitting: Ophthalmology

## 2019-10-25 ENCOUNTER — Encounter (INDEPENDENT_AMBULATORY_CARE_PROVIDER_SITE_OTHER): Payer: Medicare HMO | Admitting: Ophthalmology

## 2019-10-25 ENCOUNTER — Other Ambulatory Visit: Payer: Self-pay

## 2019-10-25 DIAGNOSIS — E11319 Type 2 diabetes mellitus with unspecified diabetic retinopathy without macular edema: Secondary | ICD-10-CM

## 2019-10-25 DIAGNOSIS — E113293 Type 2 diabetes mellitus with mild nonproliferative diabetic retinopathy without macular edema, bilateral: Secondary | ICD-10-CM

## 2019-10-25 DIAGNOSIS — H43813 Vitreous degeneration, bilateral: Secondary | ICD-10-CM

## 2019-10-25 DIAGNOSIS — H33302 Unspecified retinal break, left eye: Secondary | ICD-10-CM

## 2019-10-25 LAB — HM DIABETES EYE EXAM

## 2019-10-27 ENCOUNTER — Other Ambulatory Visit: Payer: Self-pay | Admitting: Internal Medicine

## 2019-10-27 DIAGNOSIS — E785 Hyperlipidemia, unspecified: Secondary | ICD-10-CM

## 2019-11-03 ENCOUNTER — Other Ambulatory Visit: Payer: Self-pay | Admitting: Internal Medicine

## 2019-11-03 DIAGNOSIS — E1149 Type 2 diabetes mellitus with other diabetic neurological complication: Secondary | ICD-10-CM

## 2019-11-19 ENCOUNTER — Other Ambulatory Visit: Payer: Self-pay | Admitting: Internal Medicine

## 2019-11-19 DIAGNOSIS — E1149 Type 2 diabetes mellitus with other diabetic neurological complication: Secondary | ICD-10-CM

## 2019-12-11 IMAGING — US US ABDOMEN COMPLETE
1 series · 14 of 25 positions shown · non-contrast
Comparison: None.

CLINICAL DATA: RIGHT upper quadrant abdominal pain.

EXAM:
ABDOMEN ULTRASOUND COMPLETE

[Series 1: us abdomen complete · 0.20mm/px · 14 of 96 slices shown]
[im 1/96]
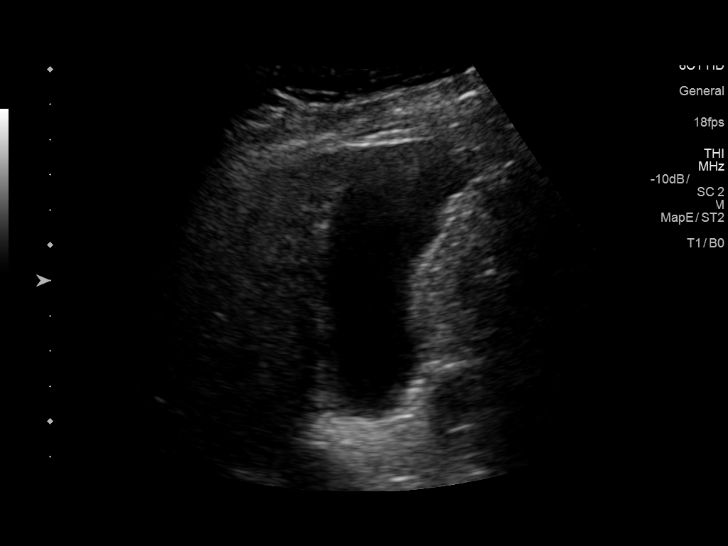
[im 8/96]
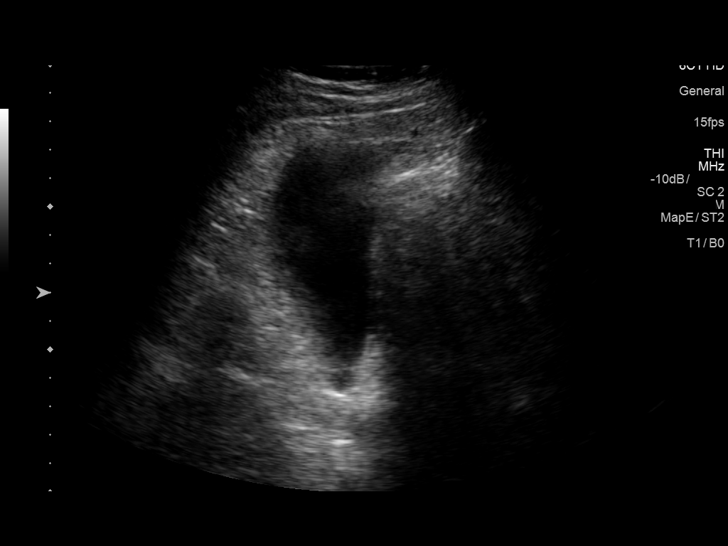
[im 16/96]
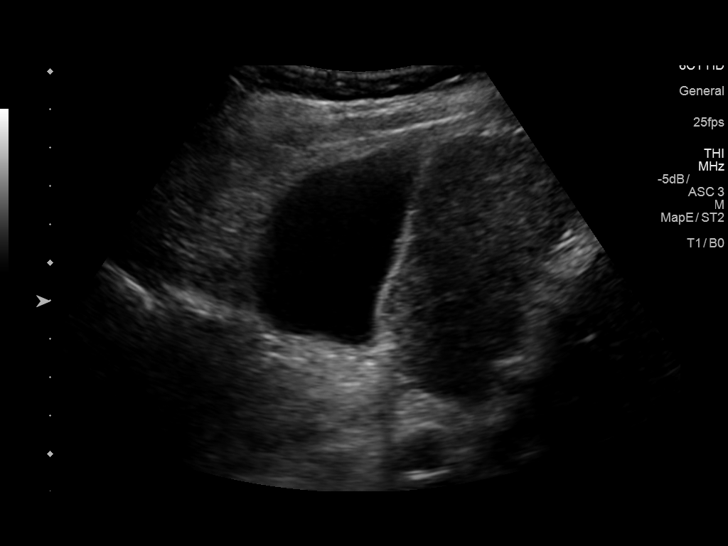
[im 24/96]
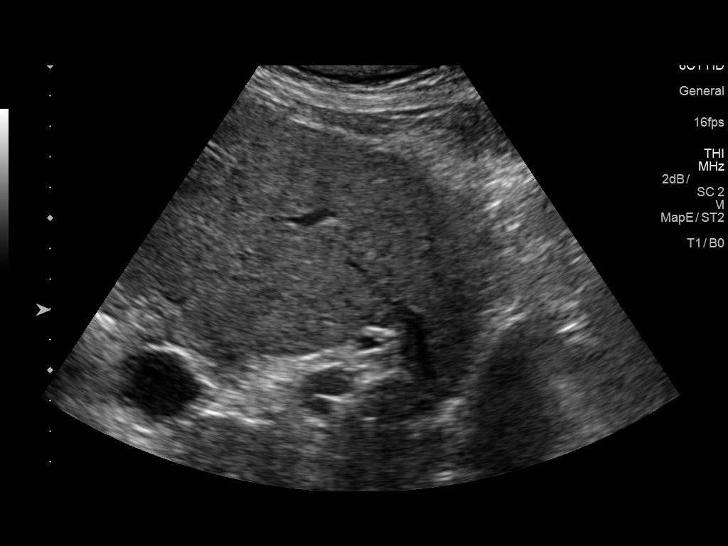
[im 32/96]
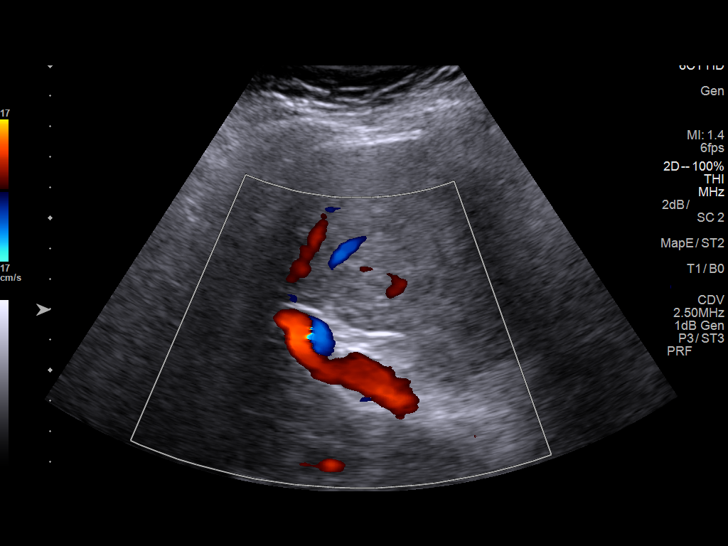
[im 36/96]
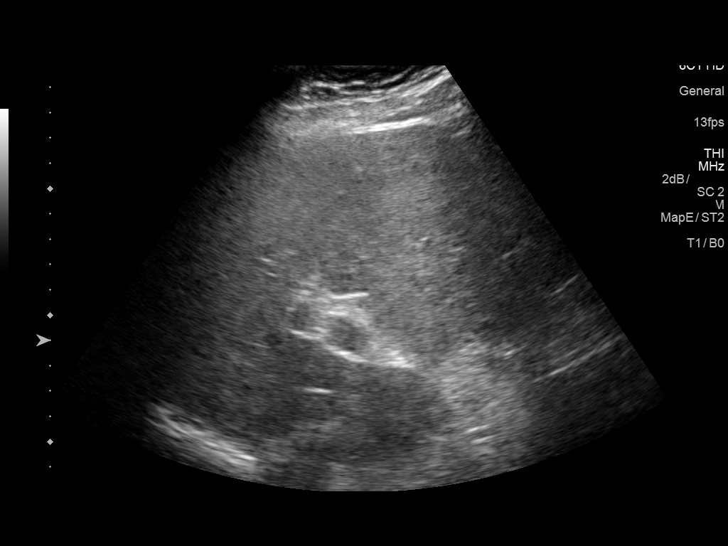
[im 44/96]
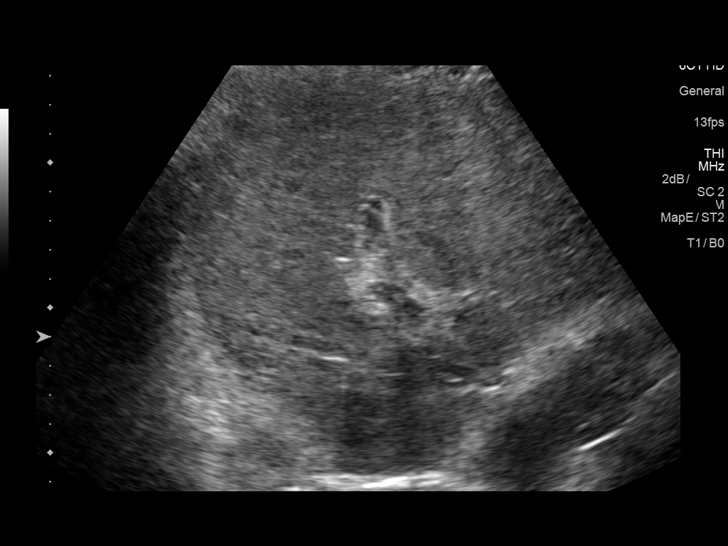
[im 52/96]
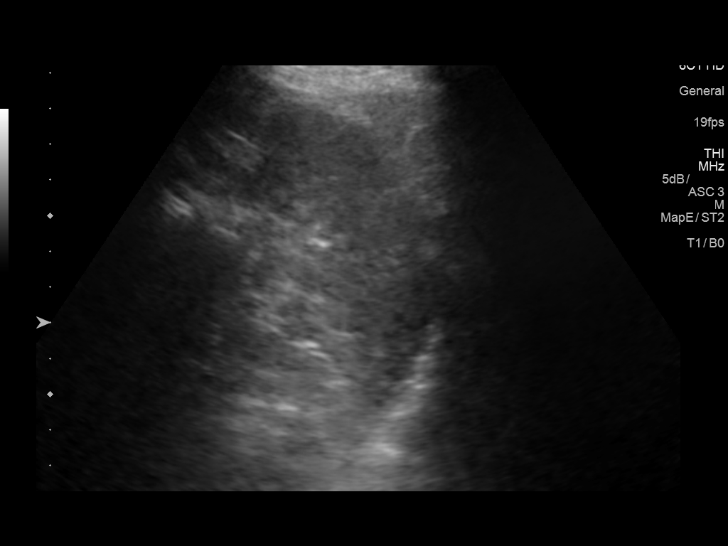
[im 60/96]
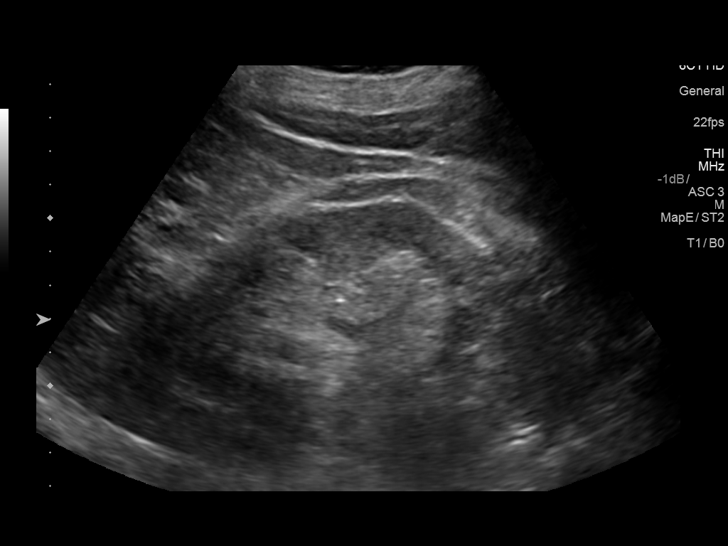
[im 64/96]
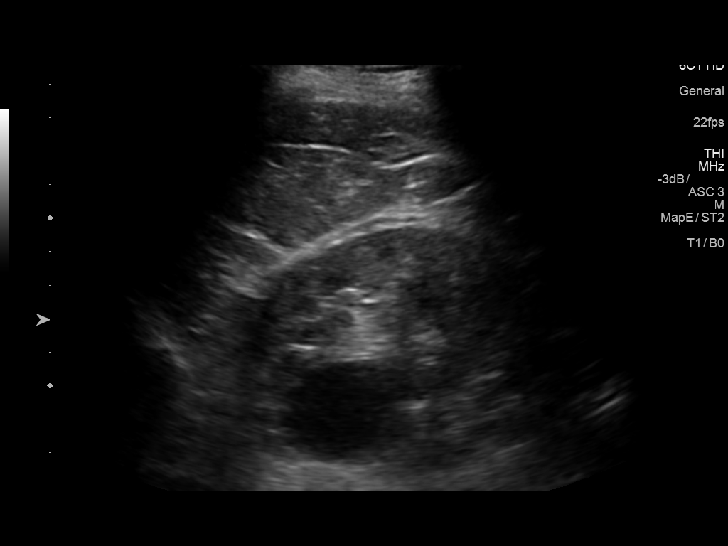
[im 72/96]
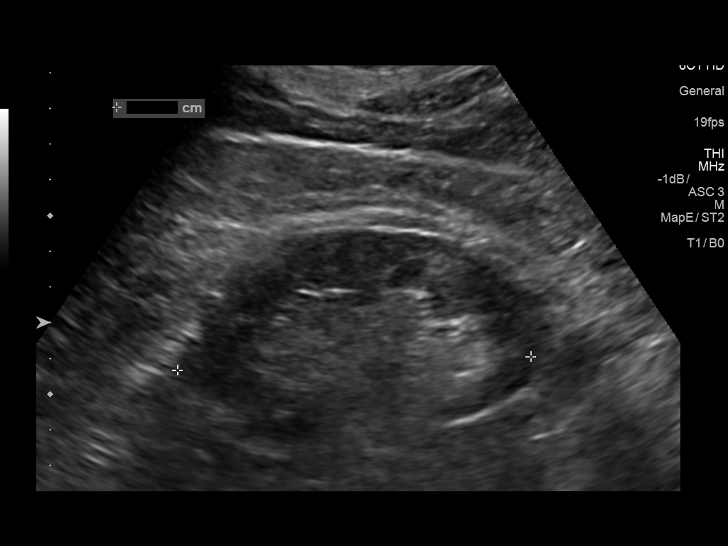
[im 80/96]
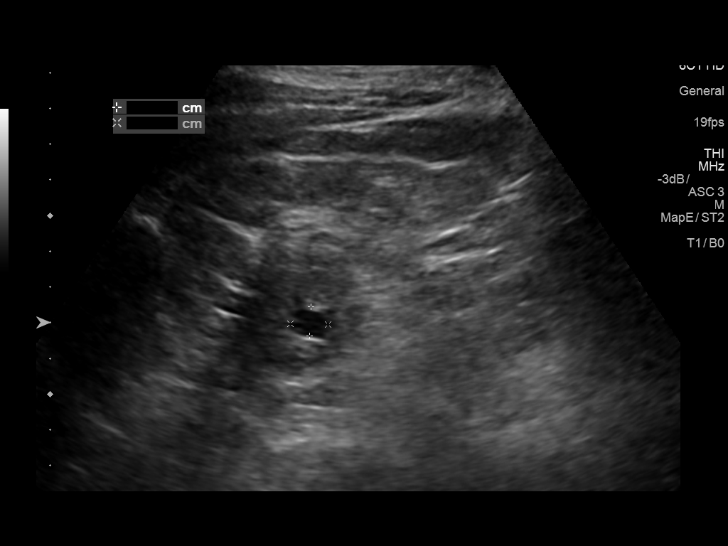
[im 88/96]
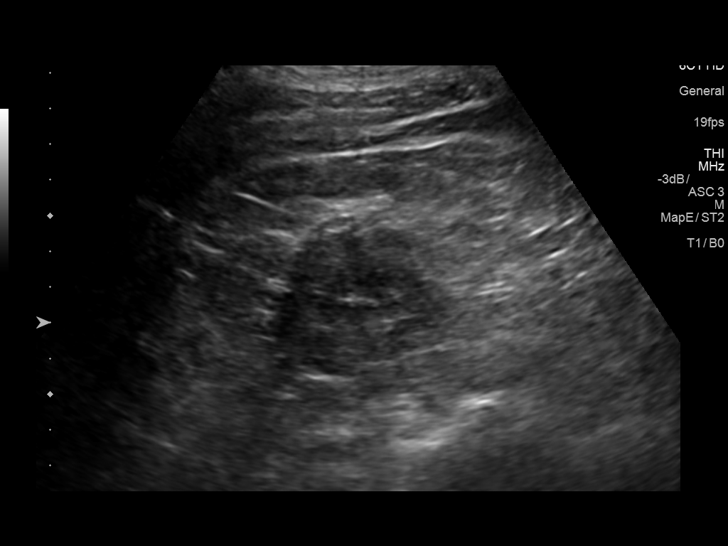
[im 96/96]
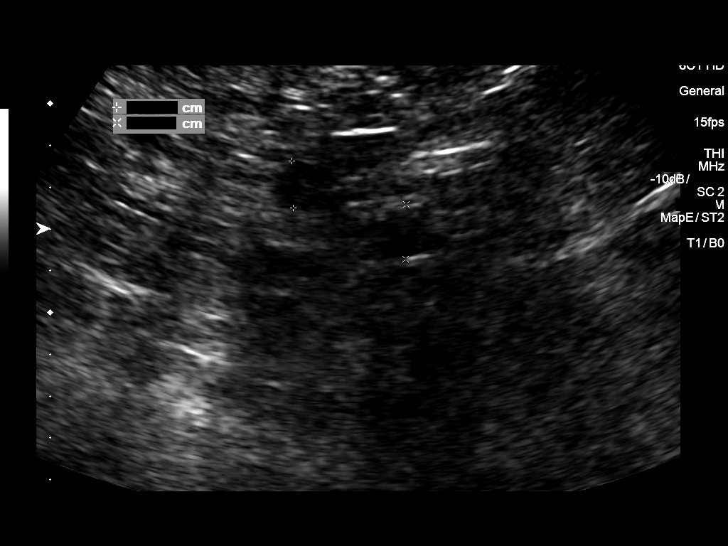

[14 of 25 positions shown; findings below may reference images not displayed]

FINDINGS: Gallbladder: No gallstones or wall thickening visualized. No
sonographic Murphy sign noted by sonographer.

Common bile duct: Diameter: Normal at 4 mm

Liver: Heterogeneous liver echotexture. No focal lesion. No duct
dilatation. Portal vein is patent on color Doppler imaging with
normal direction of blood flow towards the liver.

IVC: No abnormality visualized.

Pancreas: Visualized portion unremarkable.

Spleen: Size and appearance within normal limits.

Right Kidney: Length: 10.3 cm. 2 anechoic cysts in lower pole
measuring 1.73.6 cm.

Left Kidney: Length: 9.9 cm. Large anechoic cysts measuring 5 cm.
Second smaller 1 mm anechoic cyst.

Abdominal aorta: No aneurysm visualized.

Other findings: None.
IMPRESSION: 1. No evidence of cholecystitis.  No acute findings.
2. Benign appearing renal cysts.

## 2020-01-15 ENCOUNTER — Other Ambulatory Visit: Payer: Self-pay | Admitting: Cardiology

## 2020-01-15 ENCOUNTER — Other Ambulatory Visit: Payer: Self-pay | Admitting: Internal Medicine

## 2020-01-15 DIAGNOSIS — I2510 Atherosclerotic heart disease of native coronary artery without angina pectoris: Secondary | ICD-10-CM

## 2020-01-15 DIAGNOSIS — I10 Essential (primary) hypertension: Secondary | ICD-10-CM

## 2020-01-16 NOTE — Telephone Encounter (Signed)
rx refill

## 2020-03-05 ENCOUNTER — Ambulatory Visit: Payer: Medicare PPO | Admitting: Cardiology

## 2020-03-05 ENCOUNTER — Other Ambulatory Visit: Payer: Self-pay

## 2020-03-05 ENCOUNTER — Encounter: Payer: Self-pay | Admitting: Cardiology

## 2020-03-05 VITALS — BP 124/76 | HR 62 | Ht 70.0 in | Wt 181.0 lb

## 2020-03-05 DIAGNOSIS — E785 Hyperlipidemia, unspecified: Secondary | ICD-10-CM

## 2020-03-05 DIAGNOSIS — I10 Essential (primary) hypertension: Secondary | ICD-10-CM

## 2020-03-05 DIAGNOSIS — R748 Abnormal levels of other serum enzymes: Secondary | ICD-10-CM

## 2020-03-05 DIAGNOSIS — I2510 Atherosclerotic heart disease of native coronary artery without angina pectoris: Secondary | ICD-10-CM

## 2020-03-05 MED ORDER — NITROGLYCERIN 0.4 MG SL SUBL: 0.4000 mg | SUBLINGUAL_TABLET | SUBLINGUAL | 3 refills | Status: DC | PRN

## 2020-03-05 NOTE — Progress Notes (Signed)
Cardiology Office Note:    Date:  03/05/2020   ID:  Manuel [name], DOB December 16, 1928, MRN [id number]  PCP:  [name], MD  [facility] Cardiologist:  [name], MD  [facility] Electrophysiologist:  None   Referring MD: [name], MD     History of Present Illness:    Manuel [name] is a 87 y.o. male here for the follow-up of coronary artery disease.  Very well.  Occasionally will use nitroglycerin.  Feels well.  He is looking forward to making 90.  All of his siblings are past 9.  Tolerating his medications well without any problems.  Still walking.  In review of prior note:  He is a former patient of [name] with CAD status post MI in January 2011 with concomitant diabetes hypertension hyperlipidemia and vertiginous symptoms.  Have been quite dizzy feeling like he was going to faint no changes in diet.  Lives his daughter has assisted with historical input.  At 1 point when getting out of bed felt like he was going in circles.  He has had prior vertigo.  Has been given physical therapy for Epley maneuver  Orthostatics have been done, 619 systolic, 509, 326 standing at prior visit.  Currently, his blood pressure improved at 132.  Feels better.  Vertigo has gone away.  Rarely has to take nitroglycerin with chest pain.  Overall continuing to move well.  Daughter here with him again today.  Has enjoyed walking [place] walks 5 days a week 1 mile.  Golfing.  1. Asthma: Allergic component 2. CAD: s/p MI in 1/11. LHC (1/11) with 99% mCFX treated with 2.5 x 16 Xience V DES; 50% mLAD; EF 55%. ETT-myoview (10/12): 4'51", no significant ST segment changes, EF 56%, no ischemia or infarction. ETT-Cardiolite (2/15): 4'15", probably normal with no ischemia/infarction, EF 68%.  3. GERD 4. Appendectomy 5. HTN 6. Hyperlipidemia 7. Diabetes mellitus type II 8. Lightheaded spells: Holter (10/12): Frequent PACs and PVCs. No significant bradycardia.  Probably due primarily to BPPV.    FH: Parents lived into their 88s. No premature CAD.   SH: Moved from [place], [place] to [place]. Married, originally from [place]. Now living in daughter's house. Quit smoking 1990, retired Hotel manager, occasional smoker.  Past Medical History:  Diagnosis Date  . Allergic rhinitis    childhood  . Asthma    Allergic component  . CAD (coronary artery disease)    s/p MI in 1/11. LHC (1/11) with 99% mCFX treated with 2.5 x 16 Xience V DES; 50% mLAD; EF 55%. ETT-myoview (10/12): 4'51", no significant ST segment changes, EF 56%, no ischemia or infarction.  . 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 Chronic kidney disease    stones  . Diabetes mellitus   . Dizziness    Holter (10/12): Frequent PACs and PVCs. No significant bradycardia.   [name] GERD (gastroesophageal reflux disease)   . Hx of cardiovascular stress test    ETT-Myoview (03/2013):  Inf defect on short axis images only (not felt to be significant), EF 68%; low risk.  [name] Hyperlipidemia   . Hypertension   . MI (myocardial infarction) ([place]) 02/2009  . Pneumonia    age 43    Past Surgical History:  Procedure Laterality Date  . APPENDECTOMY    . CORONARY STENT PLACEMENT      Current Medications: Current Meds  Medication Sig  . aspirin EC 81 MG EC tablet Take 1  tablet (81 mg total) by mouth daily.  [name] atorvastatin (LIPITOR) 40 MG tablet TAKE 1 TABLET BY MOUTH EVERY DAY  . esomeprazole (NEXIUM) 40 MG capsule TAKE 1 CAPSULE BY MOUTH EVERY DAY  . irbesartan (AVAPRO) 150 MG tablet TAKE 1 TABLET BY MOUTH EVERY DAY  . meclizine (ANTIVERT) 12.5 MG tablet Take 1 tablet (12.5 mg total) by mouth 3 (three) times daily as needed for dizziness.  . metFORMIN (GLUCOPHAGE) 500 MG tablet TAKE 2 TABLETS BY MOUTH TWICE A DAY WITH A MEAL  . metoprolol tartrate (LOPRESSOR) 25 MG tablet TAKE 1/2 TABLET BY MOUTH TWICE DAILY  . Multiple Vitamin (MULTIVITAMIN) tablet Take 1 tablet by mouth daily.  [name]  omeprazole (PRILOSEC) 40 MG capsule TAKE 1 CAPSULE BY MOUTH EVERY DAY  . [DISCONTINUED] nitroGLYCERIN (NITROSTAT) 0.4 MG SL tablet Place 1 tablet (0.4 mg total) under the tongue every 5 (five) minutes as needed for chest pain.     Allergies:   Other   Social History   Socioeconomic History  . Marital status: Single    Spouse name: Not on file  . Number of children: Not on file  . Years of education: Not on file  . Highest education level: Not on file  Occupational History  . Occupation: retired  Tobacco Use  . Smoking status: Former Smoker    Types: Cigarettes    Quit date: 06/09/1966    Years since quitting: 53.7  . Smokeless tobacco: Never Used  Vaping Use  . Vaping Use: Never used  Substance and Sexual Activity  . Alcohol use: No    Alcohol/week: 0.0 standard drinks  . Drug use: No  . Sexual activity: Not Currently  Other Topics Concern  . Not on file  Social History Narrative   Associates degree.    Social Determinants of Health   Financial Resource Strain: Not on file  Food Insecurity: Not on file  Transportation Needs: Not on file  Physical Activity: Not on file  Stress: Not on file  Social Connections: Not on file     Family History: The patient's family history includes Asthma in his father; Diabetes in his mother; Heart attack in his father; Heart failure in his mother. There is no history of Cancer.  ROS:   Please see the history of present illness.     All other systems reviewed and are negative.  EKGs/Labs/Other Studies Reviewed:    The following studies were reviewed today: Prior office notes reviewed.  EKG:  EKG is  ordered today.  The ekg ordered today demonstrates sinus rhythm 62 nonspecific ST-T wave flattening,, prior personally reviewed shows some mild T wave inversion in the lateral precordial leads.  No significant change.  Recent Labs: 03/08/2019: ALT 53; BUN 29; Creatinine, Ser 1.03; Hemoglobin 13.8; Platelets 181.0; Potassium 4.2;  Sodium 137; TSH 2.20  Recent Lipid Panel    Component Value Date/Time   CHOL 153 03/08/2019 1444   TRIG 153.0 (H) 03/08/2019 1444   HDL 45.20 03/08/2019 1444   CHOLHDL 3 03/08/2019 1444   VLDL 30.6 03/08/2019 1444   LDLCALC 77 03/08/2019 1444     Risk Assessment/Calculations:       Physical Exam:    VS:  BP 124/76   Pulse 62   Ht 5' 10"  (1.778 m)   Wt 181 lb (82.1 kg)   BMI 25.97 kg/m     Wt Readings from Last 3 Encounters:  03/05/20 181 lb (82.1 kg)  09/05/19 180 lb (81.6 kg)  03/08/19  180 lb (81.6 kg)     GEN:  Well nourished, well developed in no acute distress HEENT: Normal NECK: No JVD; No carotid bruits LYMPHATICS: No lymphadenopathy CARDIAC: RRR, no murmurs, rubs, gallops RESPIRATORY:  Clear to auscultation without rales, wheezing or rhonchi  ABDOMEN: Soft, non-tender, non-distended MUSCULOSKELETAL:  No edema; No deformity  SKIN: Warm and dry NEUROLOGIC:  Alert and oriented x 3 PSYCHIATRIC:  Normal affect   ASSESSMENT:    1. Coronary artery disease without angina pectoris, unspecified vessel or lesion type, unspecified whether native or transplanted heart   2. Essential hypertension, benign   3. Elevated liver enzymes   4. Hyperlipidemia with target LDL less than 100   5. Atherosclerosis of native coronary artery of native heart without angina pectoris    PLAN:    In order of problems listed above:   Vertigo/dizziness - Not a current issue.  Doing well. -Epley maneuver.  Fairly soft blood pressure at times.  At last visit encouraged him to pull back on his medications for hypertension.   CAD -Circumflex stent DES in 2011 stable with no anginal symptoms. Prior stress test reassuring.  Overall doing very well.  His daughter is here.  She used to run phase 3 cardiac rehab at [facility].  Essential hypertension -Blood pressure fairly low at prior visit.  103/69.  We discontinued his HCTZ 12.5 and decreased his irbesartan to 150.  This was done at  prior visit.  Currently doing very well.  No changes made.  Hyperlipidemia -LDL 66 previously at goal.  At last check 77.  Continue with current atorvastatin 40 mg.  Dietary modifications.  No changes.  He is going to have lab work done soon with Dr. Ronnald Ramp.  Elevated liver enzymes/nonalcoholic steatohepatitis -Both ALT and AST have been mildly elevated for years.  His daughter has the same profile.  Continue statin therapy.  These are not 3 times the upper limit of normal.  No changes made.  Checking labs soon.        Medication Adjustments/Labs and Tests Ordered: Current medicines are reviewed at length with the patient today.  Concerns regarding medicines are outlined above.  Orders Placed This Encounter  Procedures  . EKG 12-Lead   Meds ordered this encounter  Medications  . nitroGLYCERIN (NITROSTAT) 0.4 MG SL tablet    Sig: Place 1 tablet (0.4 mg total) under the tongue every 5 (five) minutes as needed for chest pain.    Dispense:  25 tablet    Refill:  3    Patient Instructions  Medication Instructions:  The current medical regimen is effective;  continue present plan and medications.  *If you need a refill on your cardiac medications before your next appointment, please call your pharmacy*  Follow-Up: At [facility], you and your health needs are our priority.  As part of our continuing mission to provide you with exceptional heart care, we have created designated Provider Care Teams.  These Care Teams include your primary Cardiologist (physician) and Advanced Practice Providers (APPs -  Physician Assistants and Nurse Practitioners) who all work together to provide you with the care you need, when you need it.  We recommend signing up for the patient portal called "MyChart".  Sign up information is provided on this After Visit Summary.  MyChart is used to connect with patients for Virtual Visits (Telemedicine).  Patients are able to view lab/test results, encounter  notes, upcoming appointments, etc.  Non-urgent messages can be sent to your provider as  well.   To learn more about what you can do with MyChart, go to [email or web address].    Your next appointment:   12 month(s)  The format for your next appointment:   In Person  Provider:   [name], MD   Thank you for choosing [facility]!!        Signed, [name], MD  03/05/2020 4:41 PM    [place]

## 2020-03-05 NOTE — Patient Instructions (Signed)
Medication Instructions:  The current medical regimen is effective;  continue present plan and medications.  *If you need a refill on your cardiac medications before your next appointment, please call your pharmacy*  Follow-Up: At [facility], you and your health needs are our priority.  As part of our continuing mission to provide you with exceptional heart care, we have created designated Provider Care Teams.  These Care Teams include your primary Cardiologist (physician) and Advanced Practice Providers (APPs -  Physician Assistants and Nurse Practitioners) who all work together to provide you with the care you need, when you need it.  We recommend signing up for the patient portal called "MyChart".  Sign up information is provided on this After Visit Summary.  MyChart is used to connect with patients for Virtual Visits (Telemedicine).  Patients are able to view lab/test results, encounter notes, upcoming appointments, etc.  Non-urgent messages can be sent to your provider as well.   To learn more about what you can do with MyChart, go to [email or web address].    Your next appointment:   12 month(s)  The format for your next appointment:   In Person  Provider:   Mark Skains, MD   Thank you for choosing  [facility]Care!!      

## 2020-03-22 ENCOUNTER — Encounter: Payer: Self-pay | Admitting: Internal Medicine

## 2020-03-22 ENCOUNTER — Other Ambulatory Visit: Payer: Self-pay

## 2020-03-22 ENCOUNTER — Ambulatory Visit (INDEPENDENT_AMBULATORY_CARE_PROVIDER_SITE_OTHER): Payer: Medicare PPO | Admitting: Internal Medicine

## 2020-03-22 VITALS — BP 144/86 | HR 91 | Temp 98.1°F | Ht 70.0 in | Wt 180.0 lb

## 2020-03-22 DIAGNOSIS — E1149 Type 2 diabetes mellitus with other diabetic neurological complication: Secondary | ICD-10-CM

## 2020-03-22 DIAGNOSIS — K7581 Nonalcoholic steatohepatitis (NASH): Secondary | ICD-10-CM | POA: Diagnosis not present

## 2020-03-22 DIAGNOSIS — Z0000 Encounter for general adult medical examination without abnormal findings: Secondary | ICD-10-CM | POA: Diagnosis not present

## 2020-03-22 DIAGNOSIS — G8929 Other chronic pain: Secondary | ICD-10-CM | POA: Insufficient documentation

## 2020-03-22 DIAGNOSIS — E785 Hyperlipidemia, unspecified: Secondary | ICD-10-CM | POA: Diagnosis not present

## 2020-03-22 DIAGNOSIS — B351 Tinea unguium: Secondary | ICD-10-CM

## 2020-03-22 DIAGNOSIS — R109 Unspecified abdominal pain: Secondary | ICD-10-CM

## 2020-03-22 DIAGNOSIS — I10 Essential (primary) hypertension: Secondary | ICD-10-CM | POA: Diagnosis not present

## 2020-03-22 DIAGNOSIS — M79676 Pain in unspecified toe(s): Secondary | ICD-10-CM

## 2020-03-22 LAB — LIPID PANEL
Cholesterol: 169 mg/dL (ref 0–200)
HDL: 50.3 mg/dL (ref >39.00)
LDL Cholesterol: 88 mg/dL (ref 0–99)
NonHDL: 118.27
Total CHOL/HDL Ratio: 3
Triglycerides: 151 mg/dL — ABNORMAL HIGH (ref 0.0–149.0)
VLDL: 30.2 mg/dL (ref 0.0–40.0)

## 2020-03-22 LAB — URINALYSIS, ROUTINE W REFLEX MICROSCOPIC
Bilirubin Urine: NEGATIVE
Hgb urine dipstick: NEGATIVE
Ketones, ur: NEGATIVE
Leukocytes,Ua: NEGATIVE
Nitrite: NEGATIVE
RBC / HPF: NONE SEEN (ref 0–?)
Specific Gravity, Urine: 1.02 (ref 1.000–1.030)
Total Protein, Urine: NEGATIVE
Urine Glucose: NEGATIVE
Urobilinogen, UA: 0.2 (ref 0.0–1.0)
WBC, UA: NONE SEEN (ref 0–?)
pH: 6 (ref 5.0–8.0)

## 2020-03-22 LAB — CBC WITH DIFFERENTIAL/PLATELET
Basophils Absolute: 0.1 10*3/uL (ref 0.0–0.1)
Basophils Relative: 1.3 % (ref 0.0–3.0)
Eosinophils Absolute: 0.3 10*3/uL (ref 0.0–0.7)
Eosinophils Relative: 7.6 % — ABNORMAL HIGH (ref 0.0–5.0)
HCT: 42.5 % (ref 39.0–52.0)
Hemoglobin: 14.2 g/dL (ref 13.0–17.0)
Lymphocytes Relative: 22.5 % (ref 12.0–46.0)
Lymphs Abs: 0.9 10*3/uL (ref 0.7–4.0)
MCHC: 33.3 g/dL (ref 30.0–36.0)
MCV: 93.9 fl (ref 78.0–100.0)
Monocytes Absolute: 0.4 10*3/uL (ref 0.1–1.0)
Monocytes Relative: 9.4 % (ref 3.0–12.0)
Neutro Abs: 2.4 10*3/uL (ref 1.4–7.7)
Neutrophils Relative %: 59.2 % (ref 43.0–77.0)
Platelets: 156 10*3/uL (ref 150.0–400.0)
RBC: 4.53 Mil/uL (ref 4.22–5.81)
RDW: 14 % (ref 11.5–15.5)
WBC: 4.1 10*3/uL (ref 4.0–10.5)

## 2020-03-22 LAB — MICROALBUMIN / CREATININE URINE RATIO
Creatinine,U: 84.7 mg/dL
Microalb Creat Ratio: 3.4 mg/g (ref 0.0–30.0)
Microalb, Ur: 2.9 mg/dL — ABNORMAL HIGH (ref 0.0–1.9)

## 2020-03-22 LAB — HEPATIC FUNCTION PANEL
ALT: 37 U/L (ref 0–53)
AST: 36 U/L (ref 0–37)
Albumin: 4.1 g/dL (ref 3.5–5.2)
Alkaline Phosphatase: 78 U/L (ref 39–117)
Bilirubin, Direct: 0.1 mg/dL (ref 0.0–0.3)
Total Bilirubin: 0.6 mg/dL (ref 0.2–1.2)
Total Protein: 7.5 g/dL (ref 6.0–8.3)

## 2020-03-22 LAB — BASIC METABOLIC PANEL
BUN: 20 mg/dL (ref 6–23)
CO2: 36 mEq/L — ABNORMAL HIGH (ref 19–32)
Calcium: 9.8 mg/dL (ref 8.4–10.5)
Chloride: 100 mEq/L (ref 96–112)
Creatinine, Ser: 0.95 mg/dL (ref 0.40–1.50)
GFR: 70.77 mL/min (ref >60.00)
Glucose, Bld: 135 mg/dL — ABNORMAL HIGH (ref 70–99)
Potassium: 5 mEq/L (ref 3.5–5.1)
Sodium: 138 mEq/L (ref 135–145)

## 2020-03-22 LAB — HEMOGLOBIN A1C: Hgb A1c MFr Bld: 6.6 % — ABNORMAL HIGH (ref 4.6–6.5)

## 2020-03-22 LAB — TSH: TSH: 2.26 u[IU]/mL (ref 0.35–4.50)

## 2020-03-22 MED ORDER — FLUCONAZOLE 200 MG PO TABS: 200.0000 mg | ORAL_TABLET | ORAL | 0 refills | Status: DC

## 2020-03-22 NOTE — Progress Notes (Signed)
Subjective:  Patient ID: Manuel [name], male    DOB: December 28, 1928  Age: 87 y.o. MRN: [id number]  CC: Abdominal Pain, Diabetes, and Annual Exam  This visit occurred during the SARS-CoV-2 public health emergency.  Safety protocols were in place, including screening questions prior to the visit, additional usage of staff PPE, and extensive cleaning of exam room while observing appropriate contact time as indicated for disinfecting solutions.    HPI Manuel [name] presents for a CPX.  He complains of a several month history of discomfort in his right flank.  He describes it as an achy sensation. He denies loss of appetite, nausea, vomiting, fever, chills, dysuria, or hematuria.  Outpatient Medications Prior to Visit  Medication Sig Dispense Refill  . aspirin EC 81 MG EC tablet Take 1 tablet (81 mg total) by mouth daily.    [name] atorvastatin (LIPITOR) 40 MG tablet TAKE 1 TABLET BY MOUTH EVERY DAY 90 tablet 1  . Blood Glucose Monitoring Suppl (ONETOUCH VERIO FLEX SYSTEM) w/Device KIT 1 Device by Does not apply route daily. Use to check blood sugars daily Dx E11.9 1 kit 0  . esomeprazole (NEXIUM) 40 MG capsule TAKE 1 CAPSULE BY MOUTH EVERY DAY 90 capsule 1  . glucose blood (ONETOUCH VERIO) test strip USE TO CHECK BLOOD SUGARS TWICE A DAY 100 each 3  . irbesartan (AVAPRO) 150 MG tablet TAKE 1 TABLET BY MOUTH EVERY DAY 90 tablet 3  . meclizine (ANTIVERT) 12.5 MG tablet Take 1 tablet (12.5 mg total) by mouth 3 (three) times daily as needed for dizziness. 65 tablet 3  . metFORMIN (GLUCOPHAGE) 500 MG tablet TAKE 2 TABLETS BY MOUTH TWICE A DAY WITH A MEAL 360 tablet 0  . metoprolol tartrate (LOPRESSOR) 25 MG tablet TAKE 1/2 TABLET BY MOUTH TWICE DAILY 90 tablet 1  . Multiple Vitamin (MULTIVITAMIN) tablet Take 1 tablet by mouth daily.    . nitroGLYCERIN (NITROSTAT) 0.4 MG SL tablet Place 1 tablet (0.4 mg total) under the tongue every 5 (five) minutes as needed for chest pain. 25 tablet 3  . omeprazole (PRILOSEC)  40 MG capsule TAKE 1 CAPSULE BY MOUTH EVERY DAY 90 capsule 1  . ONETOUCH DELICA LANCETS 85I MISC Use to help check blood sugars twice a day Dx E11.9 100 each 3   No facility-administered medications prior to visit.    ROS Review of Systems  Constitutional: Negative.  Negative for appetite change, diaphoresis, fatigue and unexpected weight change.  HENT: Negative.   Eyes: Negative for visual disturbance.  Respiratory: Negative for cough, chest tightness, shortness of breath and wheezing.   Cardiovascular: Negative for chest pain, palpitations and leg swelling.  Gastrointestinal: Negative for abdominal pain, blood in stool, constipation, diarrhea, nausea and vomiting.  Endocrine: Negative.   Genitourinary: Positive for flank pain. Negative for difficulty urinating, dysuria, frequency and hematuria.  Musculoskeletal: Negative for arthralgias and myalgias.  Skin: Negative.  Negative for color change and pallor.  Neurological: Negative.  Negative for dizziness, weakness, light-headedness and headaches.  Hematological: Negative for adenopathy. Does not bruise/bleed easily.  Psychiatric/Behavioral: Negative.     Objective:  BP (!) 144/86   Pulse 91   Temp 98.1 F (36.7 C) (Oral)   Ht 5' 10"  (1.778 m)   Wt 180 lb (81.6 kg)   SpO2 92%   BMI 25.83 kg/m   BP Readings from Last 3 Encounters:  03/22/20 (!) 144/86  03/05/20 124/76  09/05/19 132/60    Wt Readings from Last 3 Encounters:  03/22/20 180 lb (81.6 kg)  03/05/20 181 lb (82.1 kg)  09/05/19 180 lb (81.6 kg)    Physical Exam Vitals reviewed.  Constitutional:      Appearance: Normal appearance. He is well-developed.  HENT:     Nose: Nose normal.     Mouth/Throat:     Mouth: Mucous membranes are moist.  Eyes:     General: No scleral icterus.    Extraocular Movements: Extraocular movements intact.     Conjunctiva/sclera: Conjunctivae normal.  Cardiovascular:     Rate and Rhythm: Normal rate and regular rhythm.      Pulses:          Dorsalis pedis pulses are 1+ on the right side and 1+ on the left side.       Posterior tibial pulses are 1+ on the right side and 1+ on the left side.     Heart sounds: No murmur heard.   Pulmonary:     Effort: Pulmonary effort is normal.     Breath sounds: No stridor. No wheezing, rhonchi or rales.  Abdominal:     General: Abdomen is protuberant. Bowel sounds are normal. There is no distension.     Palpations: Abdomen is soft. There is no hepatomegaly, splenomegaly or mass.     Tenderness: There is no abdominal tenderness. There is no right CVA tenderness or left CVA tenderness.     Comments: Mild ttp in the right anterior flank  Musculoskeletal:        General: Normal range of motion.     Cervical back: Neck supple.  Feet:     Right foot:     Skin integrity: Erythema present. No warmth.     Toenail Condition: Right toenails are abnormally thick and long. Fungal disease present.    Left foot:     Skin integrity: Erythema present. No warmth.     Toenail Condition: Left toenails are abnormally thick and long. Fungal disease present.    Comments: There is erythema in a serpiginous pattern with scaling extending over the sides and dorsum of his feet Lymphadenopathy:     Cervical: No cervical adenopathy.  Skin:    General: Skin is warm and dry.  Neurological:     General: No focal deficit present.     Mental Status: He is alert.  Psychiatric:        Mood and Affect: Mood normal.        Behavior: Behavior normal.     Lab Results  Component Value Date   WBC 4.1 03/22/2020   HGB 14.2 03/22/2020   HCT 42.5 03/22/2020   PLT 156.0 03/22/2020   GLUCOSE 135 (H) 03/22/2020   CHOL 169 03/22/2020   TRIG 151.0 (H) 03/22/2020   HDL 50.30 03/22/2020   LDLCALC 88 03/22/2020   ALT 37 03/22/2020   AST 36 03/22/2020   NA 138 03/22/2020   K 5.0 03/22/2020   CL 100 03/22/2020   CREATININE 0.95 03/22/2020   BUN 20 03/22/2020   CO2 36 (H) 03/22/2020   TSH 2.26  03/22/2020   INR 1.3 (H) 03/08/2019   HGBA1C 6.6 (H) 03/22/2020   MICROALBUR 2.9 (H) 03/22/2020    US Abdomen Complete  Result Date: 04/15/2018 CLINICAL DATA:  RIGHT upper quadrant abdominal pain. EXAM: ABDOMEN ULTRASOUND COMPLETE COMPARISON:  None. FINDINGS: Gallbladder: No gallstones or wall thickening visualized. No sonographic Murphy sign noted by sonographer. Common bile duct: Diameter: Normal at 4 mm Liver: Heterogeneous liver echotexture. No focal lesion. No  duct dilatation. Portal vein is patent on color Doppler imaging with normal direction of blood flow towards the liver. IVC: No abnormality visualized. Pancreas: Visualized portion unremarkable. Spleen: Size and appearance within normal limits. Right Kidney: Length: 10.3 cm. 2 anechoic cysts in lower pole measuring 1.73.6 cm. Left Kidney: Length: 9.9 cm. Large anechoic cysts measuring 5 cm. Second smaller 1 mm anechoic cyst. Abdominal aorta: No aneurysm visualized. Other findings: None. IMPRESSION: 1. No evidence of cholecystitis.  No acute findings. 2. Benign appearing renal cysts. Electronically Signed   By: Suzy Bouchard M.D.   On: 04/15/2018 11:50    Assessment & Plan:   Jacory was seen today for abdominal pain, diabetes and annual exam.  Diagnoses and all orders for this visit:  Essential hypertension, benign- His blood pressure is well controlled.  Electrolytes and renal function are normal. -     CBC with Differential/Platelet; Future -     Basic metabolic panel; Future -     TSH; Future -     Urinalysis, Routine w reflex microscopic; Future -     CBC with Differential/Platelet -     Basic metabolic panel -     TSH -     Urinalysis, Routine w reflex microscopic  Nonalcoholic steatohepatitis (NASH)- His liver enzymes are mildly elevated but treatment is not indicated. -     Hepatic function panel; Future -     Hepatic function panel  Diabetes mellitus type 2 with neurological manifestations ([place])- His blood sugars are  adequately well controlled. -     Microalbumin / creatinine urine ratio; Future -     Hemoglobin A1c; Future -     Microalbumin / creatinine urine ratio -     Hemoglobin A1c -     HM Diabetes Foot Exam  Hyperlipidemia with target LDL less than 100- He has achieved his LDL goal is doing well on the statin. -     Lipid panel; Future -     Hepatic function panel; Future -     TSH; Future -     Lipid panel -     Hepatic function panel -     TSH  Routine general medical examination at a health care facility- Exam completed, labs reviewed, vaccines reviewed and updated, no cancer screenings are indicated, patient education was given.  Pain due to onychomycosis of toenail- He has tinea pedis and onychomycosis.  After discussing treatment options with his daughter we have decided to do weekly systemic therapy with fluconazole for 12 weeks. -     fluconazole (DIFLUCAN) 200 MG tablet; Take 1 tablet (200 mg total) by mouth once a week. -     Ambulatory referral to Podiatry  Chronic right flank pain- He has mild discomfort but his exam and labs are reassuring.  If this does not resolve soon then I will consider doing a CT scan of the area to screen for malignancy.   I am having Manuel [name] start on fluconazole. I am also having him maintain his aspirin EC, multivitamin, OneTouch Verio Flex System, OneTouch Delica Lancets 78G, glucose blood, esomeprazole, meclizine, omeprazole, atorvastatin, metFORMIN, irbesartan, metoprolol tartrate, and nitroGLYCERIN.  Meds ordered this encounter  Medications  . fluconazole (DIFLUCAN) 200 MG tablet    Sig: Take 1 tablet (200 mg total) by mouth once a week.    Dispense:  12 tablet    Refill:  0   In addition to time spent on CPE, I spent 50 minutes in preparing  to see the patient by review of recent labs, imaging and procedures, obtaining and reviewing separately obtained history, communicating with the patient and family or caregiver, ordering medications,  tests or procedures, and documenting clinical information in the EHR including the differential Dx, treatment, and any further evaluation and other management of 1. Essential hypertension, benign 2. Nonalcoholic steatohepatitis (NASH) 3. Diabetes mellitus type 2 with neurological manifestations ([place]) 4. Hyperlipidemia with target LDL less than 100 5. Pain due to onychomycosis of toenail 6. Chronic right flank pain     Follow-up: Return in about 3 months (around 06/20/2020).  Scarlette Calico, MD

## 2020-03-22 NOTE — Patient Instructions (Signed)

## 2020-04-03 ENCOUNTER — Ambulatory Visit: Payer: Medicare PPO | Admitting: Podiatry

## 2020-04-07 ENCOUNTER — Other Ambulatory Visit: Payer: Self-pay | Admitting: Internal Medicine

## 2020-04-07 DIAGNOSIS — E1149 Type 2 diabetes mellitus with other diabetic neurological complication: Secondary | ICD-10-CM

## 2020-04-08 ENCOUNTER — Other Ambulatory Visit: Payer: Self-pay | Admitting: Internal Medicine

## 2020-04-08 DIAGNOSIS — K2100 Gastro-esophageal reflux disease with esophagitis, without bleeding: Secondary | ICD-10-CM

## 2020-04-11 ENCOUNTER — Other Ambulatory Visit: Payer: Self-pay

## 2020-04-11 ENCOUNTER — Ambulatory Visit: Payer: Medicare PPO | Admitting: Podiatry

## 2020-04-11 DIAGNOSIS — B351 Tinea unguium: Secondary | ICD-10-CM | POA: Diagnosis not present

## 2020-04-11 DIAGNOSIS — M79675 Pain in left toe(s): Secondary | ICD-10-CM | POA: Diagnosis not present

## 2020-04-11 DIAGNOSIS — M79674 Pain in right toe(s): Secondary | ICD-10-CM

## 2020-04-11 DIAGNOSIS — E1149 Type 2 diabetes mellitus with other diabetic neurological complication: Secondary | ICD-10-CM | POA: Diagnosis not present

## 2020-04-12 ENCOUNTER — Encounter: Payer: Self-pay | Admitting: Podiatry

## 2020-04-12 NOTE — Progress Notes (Signed)
  Subjective:  Patient ID: Manuel [name], male    DOB: [birth year],  MRN: [id number]  Chief Complaint  Patient presents with  . Nail Problem    Nail trim  Possible nail fungus    87 y.o. male returns for the above complaint.  Patient presents with thickened elongated dystrophic toenails x10.  Mild pain on palpation.  Patient would like to have the nails debrided down.  He is unable to do it himself.  He denies any other acute complaints.  He is a diabetic with last A1c of 6.6.  Objective:  There were no vitals filed for this visit. Podiatric Exam: Vascular: dorsalis pedis and posterior tibial pulses are palpable bilateral. Capillary return is immediate. Temperature gradient is WNL. Skin turgor WNL  Sensorium: Normal Semmes Weinstein monofilament test. Normal tactile sensation bilaterally. Nail Exam: Pt has thick disfigured discolored nails with subungual debris noted bilateral entire nail hallux through fifth toenails.  Pain on palpation to the nails. Ulcer Exam: There is no evidence of ulcer or pre-ulcerative changes or infection. Orthopedic Exam: Muscle tone and strength are WNL. No limitations in general ROM. No crepitus or effusions noted. HAV  B/L.  Hammer toes 2-5  B/L. Skin: No Porokeratosis. No infection or ulcers    Assessment & Plan:   1. Diabetes mellitus type 2 with neurological manifestations ([place])   2. Pain due to onychomycosis of toenails of both feet     Patient was evaluated and treated and all questions answered.  Onychomycosis with pain  -Nails palliatively debrided as below. -Educated on self-care  Procedure: Nail Debridement Rationale: pain  Type of Debridement: manual, sharp debridement. Instrumentation: Nail nipper, rotary burr. Number of Nails: 10  Procedures and Treatment: Consent by patient was obtained for treatment procedures. The patient understood the discussion of treatment and procedures well. All questions were answered thoroughly reviewed.  Debridement of mycotic and hypertrophic toenails, 1 through 5 bilateral and clearing of subungual debris. No ulceration, no infection noted.  Return Visit-Office Procedure: Patient instructed to return to the office for a follow up visit 3 months for continued evaluation and treatment.  Boneta Lucks, DPM    No follow-ups on file.

## 2020-04-19 ENCOUNTER — Other Ambulatory Visit: Payer: Self-pay | Admitting: Internal Medicine

## 2020-04-19 DIAGNOSIS — E785 Hyperlipidemia, unspecified: Secondary | ICD-10-CM

## 2020-04-24 DIAGNOSIS — H5203 Hypermetropia, bilateral: Secondary | ICD-10-CM | POA: Diagnosis not present

## 2020-04-24 DIAGNOSIS — H35033 Hypertensive retinopathy, bilateral: Secondary | ICD-10-CM | POA: Diagnosis not present

## 2020-04-24 DIAGNOSIS — H33309 Unspecified retinal break, unspecified eye: Secondary | ICD-10-CM | POA: Diagnosis not present

## 2020-04-24 DIAGNOSIS — H33302 Unspecified retinal break, left eye: Secondary | ICD-10-CM | POA: Diagnosis not present

## 2020-04-24 DIAGNOSIS — H524 Presbyopia: Secondary | ICD-10-CM | POA: Diagnosis not present

## 2020-04-24 DIAGNOSIS — H52223 Regular astigmatism, bilateral: Secondary | ICD-10-CM | POA: Diagnosis not present

## 2020-04-24 DIAGNOSIS — I10 Essential (primary) hypertension: Secondary | ICD-10-CM | POA: Diagnosis not present

## 2020-04-24 DIAGNOSIS — H3540 Unspecified peripheral retinal degeneration: Secondary | ICD-10-CM | POA: Diagnosis not present

## 2020-04-24 LAB — HM DIABETES EYE EXAM

## 2020-05-17 ENCOUNTER — Other Ambulatory Visit: Payer: Self-pay | Admitting: Internal Medicine

## 2020-05-17 DIAGNOSIS — B351 Tinea unguium: Secondary | ICD-10-CM

## 2020-06-25 ENCOUNTER — Other Ambulatory Visit: Payer: Self-pay | Admitting: Internal Medicine

## 2020-06-25 DIAGNOSIS — M79676 Pain in unspecified toe(s): Secondary | ICD-10-CM

## 2020-06-25 DIAGNOSIS — B351 Tinea unguium: Secondary | ICD-10-CM

## 2020-07-11 ENCOUNTER — Ambulatory Visit: Payer: Medicare PPO | Admitting: Podiatry

## 2020-07-11 ENCOUNTER — Other Ambulatory Visit: Payer: Self-pay

## 2020-07-11 DIAGNOSIS — M79675 Pain in left toe(s): Secondary | ICD-10-CM

## 2020-07-11 DIAGNOSIS — E1149 Type 2 diabetes mellitus with other diabetic neurological complication: Secondary | ICD-10-CM

## 2020-07-11 DIAGNOSIS — B351 Tinea unguium: Secondary | ICD-10-CM | POA: Diagnosis not present

## 2020-07-11 DIAGNOSIS — M79674 Pain in right toe(s): Secondary | ICD-10-CM

## 2020-07-12 ENCOUNTER — Encounter: Payer: Self-pay | Admitting: Podiatry

## 2020-07-12 NOTE — Progress Notes (Signed)
  Subjective:  Patient ID: Manuel [name], male    DOB: 12-25-1928,  MRN: [id number]  Chief Complaint  Patient presents with  . Nail Problem    Nail trim    87 y.o. male returns for the above complaint.  Patient presents with thickened elongated dystrophic toenails x10.  Mild pain on palpation.  Patient would like to have the nails debrided down.  He is unable to do it himself.  He denies any other acute complaints.  He is a diabetic with last A1c of 6.6.  Objective:  There were no vitals filed for this visit. Podiatric Exam: Vascular: dorsalis pedis and posterior tibial pulses are palpable bilateral. Capillary return is immediate. Temperature gradient is WNL. Skin turgor WNL  Sensorium: Normal Semmes Weinstein monofilament test. Normal tactile sensation bilaterally. Nail Exam: Pt has thick disfigured discolored nails with subungual debris noted bilateral entire nail hallux through fifth toenails.  Pain on palpation to the nails. Ulcer Exam: There is no evidence of ulcer or pre-ulcerative changes or infection. Orthopedic Exam: Muscle tone and strength are WNL. No limitations in general ROM. No crepitus or effusions noted. HAV  B/L.  Hammer toes 2-5  B/L. Skin: No Porokeratosis. No infection or ulcers    Assessment & Plan:   1. Diabetes mellitus type 2 with neurological manifestations ([place])   2. Pain due to onychomycosis of toenails of both feet     Patient was evaluated and treated and all questions answered.  Onychomycosis with pain  -Nails palliatively debrided as below. -Educated on self-care  Procedure: Nail Debridement Rationale: pain  Type of Debridement: manual, sharp debridement. Instrumentation: Nail nipper, rotary burr. Number of Nails: 10  Procedures and Treatment: Consent by patient was obtained for treatment procedures. The patient understood the discussion of treatment and procedures well. All questions were answered thoroughly reviewed. Debridement of mycotic and  hypertrophic toenails, 1 through 5 bilateral and clearing of subungual debris. No ulceration, no infection noted.  Return Visit-Office Procedure: Patient instructed to return to the office for a follow up visit 3 months for continued evaluation and treatment.  Boneta Lucks, DPM    No follow-ups on file.

## 2020-09-17 ENCOUNTER — Other Ambulatory Visit: Payer: Self-pay | Admitting: Internal Medicine

## 2020-09-17 DIAGNOSIS — B351 Tinea unguium: Secondary | ICD-10-CM

## 2020-09-17 DIAGNOSIS — I2510 Atherosclerotic heart disease of native coronary artery without angina pectoris: Secondary | ICD-10-CM

## 2020-09-17 DIAGNOSIS — I10 Essential (primary) hypertension: Secondary | ICD-10-CM

## 2020-09-17 DIAGNOSIS — K2100 Gastro-esophageal reflux disease with esophagitis, without bleeding: Secondary | ICD-10-CM

## 2020-09-17 DIAGNOSIS — E1149 Type 2 diabetes mellitus with other diabetic neurological complication: Secondary | ICD-10-CM

## 2020-09-17 DIAGNOSIS — E785 Hyperlipidemia, unspecified: Secondary | ICD-10-CM

## 2020-09-17 DIAGNOSIS — M79676 Pain in unspecified toe(s): Secondary | ICD-10-CM

## 2020-09-18 DIAGNOSIS — H1045 Other chronic allergic conjunctivitis: Secondary | ICD-10-CM | POA: Diagnosis not present

## 2020-09-18 DIAGNOSIS — H10503 Unspecified blepharoconjunctivitis, bilateral: Secondary | ICD-10-CM | POA: Diagnosis not present

## 2020-09-18 DIAGNOSIS — H52223 Regular astigmatism, bilateral: Secondary | ICD-10-CM | POA: Diagnosis not present

## 2020-09-18 DIAGNOSIS — H5203 Hypermetropia, bilateral: Secondary | ICD-10-CM | POA: Diagnosis not present

## 2020-09-18 DIAGNOSIS — H524 Presbyopia: Secondary | ICD-10-CM | POA: Diagnosis not present

## 2020-10-02 ENCOUNTER — Telehealth: Payer: Self-pay | Admitting: Internal Medicine

## 2020-10-02 NOTE — Telephone Encounter (Signed)
LVM for pt to rtn my call at [phone] to schedule awv with nha. Please schedule this appt if pt calls of comes into the office.

## 2020-10-03 DIAGNOSIS — H04129 Dry eye syndrome of unspecified lacrimal gland: Secondary | ICD-10-CM | POA: Diagnosis not present

## 2020-10-09 ENCOUNTER — Other Ambulatory Visit: Payer: Self-pay

## 2020-10-09 ENCOUNTER — Ambulatory Visit: Payer: Medicare PPO | Admitting: Internal Medicine

## 2020-10-09 ENCOUNTER — Telehealth: Payer: Self-pay | Admitting: Internal Medicine

## 2020-10-09 ENCOUNTER — Ambulatory Visit (INDEPENDENT_AMBULATORY_CARE_PROVIDER_SITE_OTHER): Payer: Medicare PPO

## 2020-10-09 ENCOUNTER — Encounter: Payer: Self-pay | Admitting: Internal Medicine

## 2020-10-09 VITALS — BP 124/70 | HR 61 | Temp 97.8°F | Ht 70.0 in | Wt 181.0 lb

## 2020-10-09 VITALS — BP 124/70 | HR 61 | Temp 97.8°F | Resp 16 | Ht 70.0 in | Wt 181.0 lb

## 2020-10-09 DIAGNOSIS — Z23 Encounter for immunization: Secondary | ICD-10-CM

## 2020-10-09 DIAGNOSIS — E1149 Type 2 diabetes mellitus with other diabetic neurological complication: Secondary | ICD-10-CM | POA: Diagnosis not present

## 2020-10-09 DIAGNOSIS — K7581 Nonalcoholic steatohepatitis (NASH): Secondary | ICD-10-CM

## 2020-10-09 DIAGNOSIS — I10 Essential (primary) hypertension: Secondary | ICD-10-CM

## 2020-10-09 DIAGNOSIS — Z0000 Encounter for general adult medical examination without abnormal findings: Secondary | ICD-10-CM

## 2020-10-09 LAB — BASIC METABOLIC PANEL
BUN: 20 mg/dL (ref 6–23)
CO2: 33 mEq/L — ABNORMAL HIGH (ref 19–32)
Calcium: 9.8 mg/dL (ref 8.4–10.5)
Chloride: 97 mEq/L (ref 96–112)
Creatinine, Ser: 0.98 mg/dL (ref 0.40–1.50)
GFR: 67.91 mL/min (ref >60.00)
Glucose, Bld: 131 mg/dL — ABNORMAL HIGH (ref 70–99)
Potassium: 5 mEq/L (ref 3.5–5.1)
Sodium: 135 mEq/L (ref 135–145)

## 2020-10-09 LAB — HEPATIC FUNCTION PANEL
ALT: 47 U/L (ref 0–53)
AST: 47 U/L — ABNORMAL HIGH (ref 0–37)
Albumin: 4 g/dL (ref 3.5–5.2)
Alkaline Phosphatase: 112 U/L (ref 39–117)
Bilirubin, Direct: 0.1 mg/dL (ref 0.0–0.3)
Total Bilirubin: 0.5 mg/dL (ref 0.2–1.2)
Total Protein: 7.3 g/dL (ref 6.0–8.3)

## 2020-10-09 LAB — HEMOGLOBIN A1C: Hgb A1c MFr Bld: 6.8 % — ABNORMAL HIGH (ref 4.6–6.5)

## 2020-10-09 MED ORDER — SHINGRIX 50 MCG/0.5ML IM SUSR: 0.5000 mL | Freq: Once | INTRAMUSCULAR | 1 refills | Status: AC

## 2020-10-09 NOTE — Patient Instructions (Signed)
Mr. Manuel [name] , Thank you for taking time to come for your Medicare Wellness Visit. I appreciate your ongoing commitment to your health goals. Please review the following plan we discussed and let me know if I can assist you in the future.   Screening recommendations/referrals: Colonoscopy: not a candidate for colon cancer Recommended yearly ophthalmology/optometry visit for glaucoma screening and checkup Recommended yearly dental visit for hygiene and checkup  Vaccinations: Influenza vaccine: 12/19/2019 Pneumococcal vaccine: 07/18/2014, 01/31/2015 Tdap vaccine: 05/27/2011; due every 10 years Shingles vaccine: never done; can check with local pharmacy for vaccine   Covid-19: 03/17/2019, 04/07/2019, 01/02/2020, 09/17/2020  Advanced directives: Please bring a copy of your health care power of attorney and living will to the office at your convenience.  Conditions/risks identified: Yes; my goal is to lose weight.  My weight goal is 175.0 pounds.  Next appointment: Please schedule your next Medicare Wellness Visit with your Nurse Health Advisor in 1 year by calling [phone].  Preventive Care 85 Years and Older, Male Preventive care refers to lifestyle choices and visits with your health care provider that can promote health and wellness. What does preventive care include? A yearly physical exam. This is also called an annual well check. Dental exams once or twice a year. Routine eye exams. Ask your health care provider how often you should have your eyes checked. Personal lifestyle choices, including: Daily care of your teeth and gums. Regular physical activity. Eating a healthy diet. Avoiding tobacco and drug use. Limiting alcohol use. Practicing safe sex. Taking low doses of aspirin every day. Taking vitamin and mineral supplements as recommended by your health care provider. What happens during an annual well check? The services and screenings done by your health care provider during your  annual well check will depend on your age, overall health, lifestyle risk factors, and family history of disease. Counseling  Your health care provider may ask you questions about your: Alcohol use. Tobacco use. Drug use. Emotional well-being. Home and relationship well-being. Sexual activity. Eating habits. History of falls. Memory and ability to understand (cognition). Work and work Statistician. Screening  You may have the following tests or measurements: Height, weight, and BMI. Blood pressure. Lipid and cholesterol levels. These may be checked every 5 years, or more frequently if you are over 10 years old. Skin check. Lung cancer screening. You may have this screening every year starting at age 85 if you have a 30-pack-year history of smoking and currently smoke or have quit within the past 15 years. Fecal occult blood test (FOBT) of the stool. You may have this test every year starting at age 85. Flexible sigmoidoscopy or colonoscopy. You may have a sigmoidoscopy every 5 years or a colonoscopy every 10 years starting at age 63. Prostate cancer screening. Recommendations will vary depending on your family history and other risks. Hepatitis C blood test. Hepatitis B blood test. Sexually transmitted disease (STD) testing. Diabetes screening. This is done by checking your blood sugar (glucose) after you have not eaten for a while (fasting). You may have this done every 1-3 years. Abdominal aortic aneurysm (AAA) screening. You may need this if you are a current or former smoker. Osteoporosis. You may be screened starting at age 85 if you are at high risk. Talk with your health care provider about your test results, treatment options, and if necessary, the need for more tests. Vaccines  Your health care provider may recommend certain vaccines, such as: Influenza vaccine. This is recommended every year. Tetanus,  diphtheria, and acellular pertussis (Tdap, Td) vaccine. You may need a Td  booster every 10 years. Zoster vaccine. You may need this after age 85. Pneumococcal 13-valent conjugate (PCV13) vaccine. One dose is recommended after age 85. Pneumococcal polysaccharide (PPSV23) vaccine. One dose is recommended after age 85. Talk to your health care provider about which screenings and vaccines you need and how often you need them. This information is not intended to replace advice given to you by your health care provider. Make sure you discuss any questions you have with your health care provider. Document Released: 03/09/2015 Document Revised: 10/31/2015 Document Reviewed: 12/12/2014 Elsevier Interactive Patient Education  2017 [place] Prevention in the Home Falls can cause injuries. They can happen to people of all ages. There are many things you can do to make your home safe and to help prevent falls. What can I do on the outside of my home? Regularly fix the edges of walkways and driveways and fix any cracks. Remove anything that might make you trip as you walk through a door, such as a raised step or threshold. Trim any bushes or trees on the path to your home. Use bright outdoor lighting. Clear any walking paths of anything that might make someone trip, such as rocks or tools. Regularly check to see if handrails are loose or broken. Make sure that both sides of any steps have handrails. Any raised decks and porches should have guardrails on the edges. Have any leaves, snow, or ice cleared regularly. Use sand or salt on walking paths during winter. Clean up any spills in your garage right away. This includes oil or grease spills. What can I do in the bathroom? Use night lights. Install grab bars by the toilet and in the tub and shower. Do not use towel bars as grab bars. Use non-skid mats or decals in the tub or shower. If you need to sit down in the shower, use a plastic, non-slip stool. Keep the floor dry. Clean up any water that spills on the floor  as soon as it happens. Remove soap buildup in the tub or shower regularly. Attach bath mats securely with double-sided non-slip rug tape. Do not have throw rugs and other things on the floor that can make you trip. What can I do in the bedroom? Use night lights. Make sure that you have a light by your bed that is easy to reach. Do not use any sheets or blankets that are too big for your bed. They should not hang down onto the floor. Have a firm chair that has side arms. You can use this for support while you get dressed. Do not have throw rugs and other things on the floor that can make you trip. What can I do in the kitchen? Clean up any spills right away. Avoid walking on wet floors. Keep items that you use a lot in easy-to-reach places. If you need to reach something above you, use a strong step stool that has a grab bar. Keep electrical cords out of the way. Do not use floor polish or wax that makes floors slippery. If you must use wax, use non-skid floor wax. Do not have throw rugs and other things on the floor that can make you trip. What can I do with my stairs? Do not leave any items on the stairs. Make sure that there are handrails on both sides of the stairs and use them. Fix handrails that are broken or loose. Make  sure that handrails are as long as the stairways. Check any carpeting to make sure that it is firmly attached to the stairs. Fix any carpet that is loose or worn. Avoid having throw rugs at the top or bottom of the stairs. If you do have throw rugs, attach them to the floor with carpet tape. Make sure that you have a light switch at the top of the stairs and the bottom of the stairs. If you do not have them, ask someone to add them for you. What else can I do to help prevent falls? Wear shoes that: Do not have high heels. Have rubber bottoms. Are comfortable and fit you well. Are closed at the toe. Do not wear sandals. If you use a stepladder: Make sure that it is  fully opened. Do not climb a closed stepladder. Make sure that both sides of the stepladder are locked into place. Ask someone to hold it for you, if possible. Clearly mark and make sure that you can see: Any grab bars or handrails. First and last steps. Where the edge of each step is. Use tools that help you move around (mobility aids) if they are needed. These include: Canes. Walkers. Scooters. Crutches. Turn on the lights when you go into a dark area. Replace any light bulbs as soon as they burn out. Set up your furniture so you have a clear path. Avoid moving your furniture around. If any of your floors are uneven, fix them. If there are any pets around you, be aware of where they are. Review your medicines with your doctor. Some medicines can make you feel dizzy. This can increase your chance of falling. Ask your doctor what other things that you can do to help prevent falls. This information is not intended to replace advice given to you by your health care provider. Make sure you discuss any questions you have with your health care provider. Document Released: 12/07/2008 Document Revised: 07/19/2015 Document Reviewed: 03/17/2014 Elsevier Interactive Patient Education  2017 [employer].

## 2020-10-09 NOTE — Progress Notes (Signed)
Subjective:   Manuel [name] is a 87 y.o. male who presents for Medicare Annual/Subsequent preventive examination.  Review of Systems     Cardiac Risk Factors include: advanced age (>69mn, >>34women);dyslipidemia;family history of premature cardiovascular disease;hypertension;male gender     Objective:    Today's Vitals   10/09/20 1346  BP: 124/70  Pulse: 61  Temp: 97.8 F (36.6 C)  SpO2: 93%  Weight: 181 lb (82.1 kg)  Height: _0  (1.778 m)  PainSc: 0-No pain   Body mass index is 25.97 kg/m.  Advanced Directives 10/09/2020 05/06/2016 02/04/2015 09/06/2014  Does Patient Have a Medical Advance Directive? Yes Yes Yes Yes  Type of Advance Directive Living will;Healthcare Power of AWalkerLiving will Living will;Healthcare Power of Attorney -  Does patient want to make changes to medical advance directive? No - Patient declined - No - Patient declined -  [place]? No - copy requested No - copy requested Yes Yes    Current Medications (verified) Outpatient Encounter Medications as of 10/09/2020  Medication Sig   aspirin EC 81 MG EC tablet Take 1 tablet (81 mg total) by mouth daily.   atorvastatin (LIPITOR) 40 MG tablet TAKE 1 TABLET BY MOUTH EVERY DAY   Blood Glucose Monitoring Suppl ([place] w/Device KIT 1 Device by Does not apply route daily. Use to check blood sugars daily Dx E11.9   esomeprazole (NEXIUM) 40 MG capsule TAKE 1 CAPSULE BY MOUTH EVERY DAY   fluconazole (DIFLUCAN) 200 MG tablet TAKE 1 TABLET BY MOUTH ONE TIME PER WEEK   glucose blood (ONETOUCH VERIO) test strip USE TO CHECK BLOOD SUGARS TWICE A DAY   irbesartan (AVAPRO) 150 MG tablet TAKE 1 TABLET BY MOUTH EVERY DAY   meclizine (ANTIVERT) 12.5 MG tablet Take 1 tablet (12.5 mg total) by mouth 3 (three) times daily as needed for dizziness.   metFORMIN (GLUCOPHAGE) 500 MG tablet TAKE 2 TABLETS BY MOUTH TWICE A DAY WITH A MEAL    metoprolol tartrate (LOPRESSOR) 25 MG tablet TAKE 1/2 TABLET BY MOUTH TWICE DAILY   Multiple Vitamin (MULTIVITAMIN) tablet Take 1 tablet by mouth daily.   nitroGLYCERIN (NITROSTAT) 0.4 MG SL tablet Place 1 tablet (0.4 mg total) under the tongue every 5 (five) minutes as needed for chest pain.   omeprazole (PRILOSEC) 40 MG capsule TAKE 1 CAPSULE BY MOUTH EVERY DAY   ONETOUCH DELICA LANCETS 363WMISC Use to help check blood sugars twice a day Dx E11.9   No facility-administered encounter medications on file as of 10/09/2020.    Allergies (verified) Other   History: Past Medical History:  Diagnosis Date   Allergic rhinitis    childhood   Asthma    Allergic component   CAD (coronary artery disease)    s/p MI in 1/11. LHC (1/11) with 99% mCFX treated with 2.5 x 16 Xience V DES; 50% mLAD; EF 55%. ETT-myoview (10/12): 4'51", no significant ST segment changes, EF 56%, no ischemia or infarction.   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 No significant bradycardia.    GERD (gastroesophageal reflux disease)    Hx of cardiovascular stress test    ETT-Myoview (03/2013):  Inf defect on short axis images only (not felt to be significant), EF 68%; low risk.  Hyperlipidemia    Hypertension    MI (myocardial infarction) ([place]) 02/2009   Pneumonia    age 65   Past Surgical History:  Procedure Laterality Date   APPENDECTOMY     CORONARY STENT PLACEMENT     Family History  Problem Relation Age of Onset   Diabetes Mother    Heart failure Mother    Heart attack Father    Asthma Father    Cancer Neg Hx    Social History   Socioeconomic History   Marital status: Single    Spouse name: Not on file   Number of children: Not on file   Years of education: Not on file   Highest education level: Not on file  Occupational History   Occupation: retired  Tobacco Use   Smoking status: Former     Types: Cigarettes    Quit date: 06/09/1966    Years since quitting: 54.3   Smokeless tobacco: Never  Vaping Use   Vaping Use: Never used  Substance and Sexual Activity   Alcohol use: No    Alcohol/week: 0.0 standard drinks   Drug use: No   Sexual activity: Not Currently  Other Topics Concern   Not on file  Social History Narrative   Associates degree.    Social Determinants of Health   Financial Resource Strain: Low Risk    Difficulty of Paying Living Expenses: Not hard at all  Food Insecurity: No Food Insecurity   Worried About Charity fundraiser in the Last Year: Never true   Britt in the Last Year: Never true  Transportation Needs: No Transportation Needs   Lack of Transportation (Medical): No   Lack of Transportation (Non-Medical): No  Physical Activity: Sufficiently Active   Days of Exercise per Week: 5 days   Minutes of Exercise per Session: 60 min  Stress: No Stress Concern Present   Feeling of Stress : Not at all  Social Connections: Socially Integrated   Frequency of Communication with Friends and Family: More than three times a week   Frequency of Social Gatherings with Friends and Family: More than three times a week   Attends Religious Services: More than 4 times per year   Active Member of [employer] or Organizations: Yes   Attends Music therapist: More than 4 times per year   Marital Status: Married    Tobacco Counseling Counseling given: Not Answered   Clinical Intake:  Pre-visit preparation completed: Yes  Pain : No/denies pain Pain Score: 0-No pain     BMI - recorded: 25.97 Nutritional Status: BMI 25 -29 Overweight Nutritional Risks: None Diabetes: No  How often do you need to have someone help you when you read instructions, pamphlets, or other written materials from your doctor or pharmacy?: 1 - Never What is the last grade level you completed in school?: Associates Degree; [employer]  Diabetic? no  Interpreter  Needed?: No  Information entered by :: Lisette Abu, LPN   Activities of Daily Living In your present state of health, do you have any difficulty performing the following activities: 10/09/2020 10/09/2020  Hearing? Y N  Vision? N N  Difficulty concentrating or making decisions? Y N  Walking or climbing stairs? N N  Dressing or bathing? N N  Doing errands, shopping? N N  Preparing Food and eating ? N -  Using the Toilet? N -  In the past six months, have you accidently leaked urine? N -  Do you  have problems with loss of bowel control? N -  Managing your Medications? N -  Managing your Finances? N -  Housekeeping or managing your Housekeeping? N -  Some recent data might be hidden    Patient Care Team: [name], MD as PCP - General (Internal Medicine) Jerline Pain, MD as PCP - Cardiology (Cardiology) Marica Otter, OD as Consulting Physician (Optometry)  Indicate any recent Medical Services you may have received from other than [facility] providers in the past year (date may be approximate).     Assessment:   This is a routine wellness examination for [place].  Hearing/Vision screen Hearing Screening - Comments:: Patient has notice a decline in hearing. Per audiologist not eligible for hearing aids at this time. Vision Screening - Comments:: Patient wears eye glasses.  Eye exam done annually by Dr. Marica Otter.  Dietary issues and exercise activities discussed: Current Exercise Habits: Home exercise routine, Type of exercise: walking, Time (Minutes): 60, Frequency (Times/Week): 5, Weekly Exercise (Minutes/Week): 300, Intensity: Moderate, Exercise limited by: respiratory conditions(s)   Goals Addressed               This Visit's Progress     Patient Stated (pt-stated)        My goal is to lose weight.  My weight goal is 175 pounds.      Depression Screen PHQ 2/9 Scores 10/09/2020 03/22/2020 10/11/2018 02/09/2018 02/09/2018 08/11/2017 05/06/2016  PHQ - 2 Score 0 0 0  0 0 0 0    Fall Risk Fall Risk  10/09/2020 03/22/2020 10/11/2018 02/09/2018 08/11/2017  Falls in the past year? 0 0 0 0 No  Number falls in past yr: 0 - 0 - -  Injury with Fall? 0 - 0 - -  Risk for fall due to : No Fall Risks - Impaired balance/gait - -  Follow up Falls evaluation completed - Falls evaluation completed - -    FALL RISK PREVENTION PERTAINING TO THE HOME:  Any stairs in or around the home? No  If so, are there any without handrails? No  Home free of loose throw rugs in walkways, pet beds, electrical cords, etc? Yes  Adequate lighting in your home to reduce risk of falls? Yes   ASSISTIVE DEVICES UTILIZED TO PREVENT FALLS:  Life alert? Yes  Use of a cane, walker or w/c? No  Grab bars in the bathroom? Yes  Shower chair or bench in shower? Yes  Elevated toilet seat or a handicapped toilet? Yes   TIMED UP AND GO:  Was the test performed? Yes .  Length of time to ambulate 10 feet: 7 sec.   Gait steady and fast without use of assistive device  Cognitive Function: Normal cognitive status assessed by direct observation by this Nurse Health Advisor. No abnormalities found.   MMSE - Mini Mental State Exam 09/06/2014  Not completed: Unable to complete        Immunizations Immunization History  Administered Date(s) Administered   Influenza Whole 12/31/2010, 12/24/2011   Influenza, High Dose Seasonal PF 12/01/2016, 12/06/2017   Influenza,inj,Quad PF,6+ Mos 01/31/2015   Influenza-Unspecified 12/27/2013, 11/29/2015, 12/19/2019   PFIZER(Purple Top)SARS-COV-2 Vaccination 03/17/2019, 04/07/2019, 01/02/2020, 09/17/2020   Pneumococcal Conjugate-13 11/15/2009, 09/21/2012, 07/18/2014   Pneumococcal Polysaccharide-23 01/31/2015   Tdap 05/27/2011   Zoster, Live 02/26/2015    TDAP status: Up to date  Flu Vaccine status: Up to date  Pneumococcal vaccine status: Up to date  Covid-19 vaccine status: Completed vaccines  Qualifies for Shingles  Vaccine? Yes   Zostavax  completed Yes   Shingrix Completed?: No.    Education has been provided regarding the importance of this vaccine. Patient has been advised to call insurance company to determine out of pocket expense if they have not yet received this vaccine. Advised may also receive vaccine at local pharmacy or Health Dept. Verbalized acceptance and understanding.  Screening Tests Health Maintenance  Topic Date Due   Zoster Vaccines- Shingrix (1 of 2) Never done   INFLUENZA VACCINE  09/24/2020   COVID-19 Vaccine (5 - Booster for Pfizer series) 01/18/2021   FOOT EXAM  03/22/2021   OPHTHALMOLOGY EXAM  04/24/2021   TETANUS/TDAP  05/26/2021   PNA vac Low Risk Adult  Completed   HPV VACCINES  Aged Out    Health Maintenance  Health Maintenance Due  Topic Date Due   Zoster Vaccines- Shingrix (1 of 2) Never done   INFLUENZA VACCINE  09/24/2020    Colorectal cancer screening: No longer required.   Lung Cancer Screening: (Low Dose CT Chest recommended if Age 44-80 years, 30 pack-year currently smoking OR have quit w/in 15years.) does not qualify.   Lung Cancer Screening Referral: no  Additional Screening:  Hepatitis C Screening: does not qualify; Completed no  Vision Screening: Recommended annual ophthalmology exams for early detection of glaucoma and other disorders of the eye. Is the patient up to date with their annual eye exam?  Yes  Who is the provider or what is the name of the office in which the patient attends annual eye exams? Marica Otter, OD If pt is not established with a provider, would they like to be referred to a provider to establish care? No .   Dental Screening: Recommended annual dental exams for proper oral hygiene  Community Resource Referral / Chronic Care Management: CRR required this visit?  No   CCM required this visit?  No      Plan:     I have personally reviewed and noted the following in the patient's chart:   Medical and social history Use of alcohol,  tobacco or illicit drugs  Current medications and supplements including opioid prescriptions. Patient is not currently taking opioid prescriptions. Functional ability and status Nutritional status Physical activity Advanced directives List of other physicians Hospitalizations, surgeries, and ER visits in previous 12 months Vitals Screenings to include cognitive, depression, and falls Referrals and appointments  In addition, I have reviewed and discussed with patient certain preventive protocols, quality metrics, and best practice recommendations. A written personalized care plan for preventive services as well as general preventive health recommendations were provided to patient.     Sheral Flow, LPN   [id number]   Nurse Notes: n/a

## 2020-10-09 NOTE — Progress Notes (Signed)
Subjective:  Patient ID: Manuel [name], male    DOB: [birth year]  Age: 87 y.o. MRN: [id number]  CC: Hypertension and Diabetes  This visit occurred during the SARS-CoV-2 public health emergency.  Safety protocols were in place, including screening questions prior to the visit, additional usage of staff PPE, and extensive cleaning of exam room while observing appropriate contact time as indicated for disinfecting solutions.    HPI Manuel [name] presents for f/up -  He walks every day and does not experience CP, DOE, palpitations, edema, or fatigue.  Outpatient Medications Prior to Visit  Medication Sig Dispense Refill   aspirin EC 81 MG EC tablet Take 1 tablet (81 mg total) by mouth daily.     atorvastatin (LIPITOR) 40 MG tablet TAKE 1 TABLET BY MOUTH EVERY DAY 90 tablet 0   Blood Glucose Monitoring Suppl ([place]) w/Device KIT 1 Device by Does not apply route daily. Use to check blood sugars daily Dx E11.9 1 kit 0   esomeprazole (NEXIUM) 40 MG capsule TAKE 1 CAPSULE BY MOUTH EVERY DAY 90 capsule 1   fluconazole (DIFLUCAN) 200 MG tablet TAKE 1 TABLET BY MOUTH ONE TIME PER WEEK 4 tablet 0   glucose blood (ONETOUCH VERIO) test strip USE TO CHECK BLOOD SUGARS TWICE A DAY 100 each 3   irbesartan (AVAPRO) 150 MG tablet TAKE 1 TABLET BY MOUTH EVERY DAY 90 tablet 3   meclizine (ANTIVERT) 12.5 MG tablet Take 1 tablet (12.5 mg total) by mouth 3 (three) times daily as needed for dizziness. 65 tablet 3   metoprolol tartrate (LOPRESSOR) 25 MG tablet TAKE 1/2 TABLET BY MOUTH TWICE DAILY 90 tablet 0   Multiple Vitamin (MULTIVITAMIN) tablet Take 1 tablet by mouth daily.     nitroGLYCERIN (NITROSTAT) 0.4 MG SL tablet Place 1 tablet (0.4 mg total) under the tongue every 5 (five) minutes as needed for chest pain. 25 tablet 3   omeprazole (PRILOSEC) 40 MG capsule TAKE 1 CAPSULE BY MOUTH EVERY DAY 90 capsule 0   ONETOUCH DELICA LANCETS 09Q MISC Use to help check blood sugars twice a day Dx E11.9  100 each 3   metFORMIN (GLUCOPHAGE) 500 MG tablet TAKE 2 TABLETS BY MOUTH TWICE A DAY WITH A MEAL 360 tablet 0   No facility-administered medications prior to visit.    ROS Review of Systems  Constitutional:  Negative for diaphoresis and fatigue.  HENT: Negative.    Eyes: Negative.   Respiratory:  Negative for cough, chest tightness and shortness of breath.   Cardiovascular:  Negative for chest pain, palpitations and leg swelling.  Gastrointestinal:  Negative for abdominal pain, diarrhea and nausea.  Endocrine: Negative.   Genitourinary: Negative.  Negative for difficulty urinating.  Musculoskeletal: Negative.  Negative for myalgias.  Skin: Negative.   Neurological: Negative.  Negative for dizziness, weakness and light-headedness.  Hematological:  Negative for adenopathy. Does not bruise/bleed easily.  Psychiatric/Behavioral: Negative.     Objective:  BP 124/70   Pulse 61   Temp 97.8 F (36.6 C) (Oral)   Ht 5' 10"  (1.778 m)   Wt 181 lb (82.1 kg)   SpO2 93%   BMI 25.97 kg/m   BP Readings from Last 3 Encounters:  10/09/20 124/70  10/09/20 124/70  03/22/20 (!) 144/86    Wt Readings from Last 3 Encounters:  10/09/20 181 lb (82.1 kg)  10/09/20 181 lb (82.1 kg)  03/22/20 180 lb (81.6 kg)    Physical Exam Vitals reviewed.  HENT:     Nose: Nose normal.     Mouth/Throat:     Mouth: Mucous membranes are moist.  Eyes:     Conjunctiva/sclera: Conjunctivae normal.  Cardiovascular:     Rate and Rhythm: Normal rate and regular rhythm.     Heart sounds: No murmur heard. Pulmonary:     Effort: Pulmonary effort is normal.     Breath sounds: No stridor. No wheezing, rhonchi or rales.  Abdominal:     General: Abdomen is flat. Bowel sounds are normal. There is no distension.     Palpations: Abdomen is soft. There is no hepatomegaly, splenomegaly or mass.     Tenderness: There is no abdominal tenderness.  Musculoskeletal:        General: Normal range of motion.     Cervical  back: Neck supple.  Lymphadenopathy:     Cervical: No cervical adenopathy.  Skin:    General: Skin is warm.  Neurological:     General: No focal deficit present.     Mental Status: He is alert.  Psychiatric:        Mood and Affect: Mood normal.        Behavior: Behavior normal.    Lab Results  Component Value Date   WBC 4.1 03/22/2020   HGB 14.2 03/22/2020   HCT 42.5 03/22/2020   PLT 156.0 03/22/2020   GLUCOSE 131 (H) 10/09/2020   CHOL 169 03/22/2020   TRIG 151.0 (H) 03/22/2020   HDL 50.30 03/22/2020   LDLCALC 88 03/22/2020   ALT 47 10/09/2020   AST 47 (H) 10/09/2020   NA 135 10/09/2020   K 5.0 10/09/2020   CL 97 10/09/2020   CREATININE 0.98 10/09/2020   BUN 20 10/09/2020   CO2 33 (H) 10/09/2020   TSH 2.26 03/22/2020   INR 1.3 (H) 03/08/2019   HGBA1C 6.8 (H) 10/09/2020   MICROALBUR 2.9 (H) 03/22/2020    US Abdomen Complete  Result Date: 04/15/2018 CLINICAL DATA:  RIGHT upper quadrant abdominal pain. EXAM: ABDOMEN ULTRASOUND COMPLETE COMPARISON:  None. FINDINGS: Gallbladder: No gallstones or wall thickening visualized. No sonographic Murphy sign noted by sonographer. Common bile duct: Diameter: Normal at 4 mm Liver: Heterogeneous liver echotexture. No focal lesion. No duct dilatation. Portal vein is patent on color Doppler imaging with normal direction of blood flow towards the liver. IVC: No abnormality visualized. Pancreas: Visualized portion unremarkable. Spleen: Size and appearance within normal limits. Right Kidney: Length: 10.3 cm. 2 anechoic cysts in lower pole measuring 1.73.6 cm. Left Kidney: Length: 9.9 cm. Large anechoic cysts measuring 5 cm. Second smaller 1 mm anechoic cyst. Abdominal aorta: No aneurysm visualized. Other findings: None. IMPRESSION: 1. No evidence of cholecystitis.  No acute findings. 2. Benign appearing renal cysts. Electronically Signed   By: Suzy Bouchard M.D.   On: 04/15/2018 11:50    Assessment & Plan:   Manuel [name] was seen today for  hypertension and diabetes.  Diagnoses and all orders for this visit:  Essential hypertension, benign- His blood pressure is adequately well controlled. -     Basic metabolic panel; Future -     Basic metabolic panel  Nonalcoholic steatohepatitis (NASH)- His liver enzymes are stable. -     Hemoglobin A1c; Future -     Hemoglobin A1c  Diabetes mellitus type 2 with neurological manifestations ([place])- His blood sugar is adequately well controlled.  Will continue the current dose of metformin. -     Basic metabolic panel; Future -  Hepatic function panel; Future -     Hepatic function panel -     Basic metabolic panel  Need for shingles vaccine -     Zoster Vaccine Adjuvanted [facility]) injection; Inject 0.5 mLs into the muscle once for 1 dose.  I am having [name] start on Shingrix. I am also having him maintain his aspirin EC, multivitamin, OneTouch Verio Flex System, OneTouch Delica Lancets 62M, glucose blood, esomeprazole, meclizine, irbesartan, nitroGLYCERIN, metoprolol tartrate, fluconazole, omeprazole, atorvastatin, and metFORMIN.  Meds ordered this encounter  Medications   Zoster Vaccine Adjuvanted [facility]) injection    Sig: Inject 0.5 mLs into the muscle once for 1 dose.    Dispense:  0.5 mL    Refill:  1      Follow-up: No follow-ups on file.  Scarlette Calico, MD

## 2020-10-09 NOTE — Telephone Encounter (Signed)
Wants to know if patient should get Shingles vaccine  Callback [phone]

## 2020-10-09 NOTE — Telephone Encounter (Signed)
Called Kathlee Nations, LVM stating PCP agrees that pt should get vaccine and that Rx was sent to the pharmacy.

## 2020-10-10 ENCOUNTER — Encounter: Payer: Self-pay | Admitting: Internal Medicine

## 2020-10-10 ENCOUNTER — Ambulatory Visit: Payer: Medicare PPO | Admitting: Podiatry

## 2020-10-17 ENCOUNTER — Ambulatory Visit: Payer: Medicare PPO | Admitting: Podiatry

## 2020-10-17 ENCOUNTER — Other Ambulatory Visit: Payer: Self-pay

## 2020-10-17 ENCOUNTER — Encounter: Payer: Self-pay | Admitting: Podiatry

## 2020-10-17 DIAGNOSIS — E1149 Type 2 diabetes mellitus with other diabetic neurological complication: Secondary | ICD-10-CM | POA: Diagnosis not present

## 2020-10-17 DIAGNOSIS — H524 Presbyopia: Secondary | ICD-10-CM | POA: Diagnosis not present

## 2020-10-17 DIAGNOSIS — B351 Tinea unguium: Secondary | ICD-10-CM | POA: Diagnosis not present

## 2020-10-17 DIAGNOSIS — M79674 Pain in right toe(s): Secondary | ICD-10-CM

## 2020-10-17 DIAGNOSIS — H52223 Regular astigmatism, bilateral: Secondary | ICD-10-CM | POA: Diagnosis not present

## 2020-10-17 DIAGNOSIS — H5203 Hypermetropia, bilateral: Secondary | ICD-10-CM | POA: Diagnosis not present

## 2020-10-17 DIAGNOSIS — H04123 Dry eye syndrome of bilateral lacrimal glands: Secondary | ICD-10-CM | POA: Diagnosis not present

## 2020-10-17 DIAGNOSIS — H59813 Chorioretinal scars after surgery for detachment, bilateral: Secondary | ICD-10-CM | POA: Diagnosis not present

## 2020-10-17 DIAGNOSIS — M79675 Pain in left toe(s): Secondary | ICD-10-CM

## 2020-10-18 ENCOUNTER — Encounter: Payer: Self-pay | Admitting: Podiatry

## 2020-10-18 DIAGNOSIS — D225 Melanocytic nevi of trunk: Secondary | ICD-10-CM | POA: Diagnosis not present

## 2020-10-18 DIAGNOSIS — L570 Actinic keratosis: Secondary | ICD-10-CM | POA: Diagnosis not present

## 2020-10-18 DIAGNOSIS — L814 Other melanin hyperpigmentation: Secondary | ICD-10-CM | POA: Diagnosis not present

## 2020-10-18 DIAGNOSIS — L719 Rosacea, unspecified: Secondary | ICD-10-CM | POA: Diagnosis not present

## 2020-10-18 DIAGNOSIS — Z85828 Personal history of other malignant neoplasm of skin: Secondary | ICD-10-CM | POA: Diagnosis not present

## 2020-10-18 DIAGNOSIS — L821 Other seborrheic keratosis: Secondary | ICD-10-CM | POA: Diagnosis not present

## 2020-10-18 DIAGNOSIS — L578 Other skin changes due to chronic exposure to nonionizing radiation: Secondary | ICD-10-CM | POA: Diagnosis not present

## 2020-10-18 NOTE — Progress Notes (Signed)
  Subjective:  Patient ID: Manuel [name], male    DOB: 23-May-1928,  MRN: [id number]  Chief Complaint  Patient presents with   Nail Problem    Nail trim    87 y.o. male returns for the above complaint.  Patient presents with thickened elongated dystrophic toenails x10.  Mild pain on palpation.  Patient would like to have the nails debrided down.  He is unable to do it himself.  He denies any other acute complaints.  He is a diabetic with last A1c of 6.6.  Objective:  There were no vitals filed for this visit. Podiatric Exam: Vascular: dorsalis pedis and posterior tibial pulses are palpable bilateral. Capillary return is immediate. Temperature gradient is WNL. Skin turgor WNL  Sensorium: Normal Semmes Weinstein monofilament test. Normal tactile sensation bilaterally. Nail Exam: Pt has thick disfigured discolored nails with subungual debris noted bilateral entire nail hallux through fifth toenails.  Pain on palpation to the nails. Ulcer Exam: There is no evidence of ulcer or pre-ulcerative changes or infection. Orthopedic Exam: Muscle tone and strength are WNL. No limitations in general ROM. No crepitus or effusions noted. HAV  B/L.  Hammer toes 2-5  B/L. Skin: No Porokeratosis. No infection or ulcers    Assessment & Plan:   1. Diabetes mellitus type 2 with neurological manifestations ([place])   2. Pain due to onychomycosis of toenails of both feet      Patient was evaluated and treated and all questions answered.  Onychomycosis with pain  -Nails palliatively debrided as below. -Educated on self-care  Procedure: Nail Debridement Rationale: pain  Type of Debridement: manual, sharp debridement. Instrumentation: Nail nipper, rotary burr. Number of Nails: 10  Procedures and Treatment: Consent by patient was obtained for treatment procedures. The patient understood the discussion of treatment and procedures well. All questions were answered thoroughly reviewed. Debridement of mycotic and  hypertrophic toenails, 1 through 5 bilateral and clearing of subungual debris. No ulceration, no infection noted.  Return Visit-Office Procedure: Patient instructed to return to the office for a follow up visit 3 months for continued evaluation and treatment.  Boneta Lucks, DPM    Return in about 3 months (around 01/17/2021).

## 2020-10-30 ENCOUNTER — Encounter (INDEPENDENT_AMBULATORY_CARE_PROVIDER_SITE_OTHER): Payer: Medicare PPO | Admitting: Ophthalmology

## 2020-10-30 ENCOUNTER — Other Ambulatory Visit: Payer: Self-pay

## 2020-10-30 DIAGNOSIS — E113293 Type 2 diabetes mellitus with mild nonproliferative diabetic retinopathy without macular edema, bilateral: Secondary | ICD-10-CM | POA: Diagnosis not present

## 2020-10-30 DIAGNOSIS — H43813 Vitreous degeneration, bilateral: Secondary | ICD-10-CM | POA: Diagnosis not present

## 2020-10-30 DIAGNOSIS — H33302 Unspecified retinal break, left eye: Secondary | ICD-10-CM

## 2020-10-30 LAB — HM DIABETES EYE EXAM

## 2020-12-14 ENCOUNTER — Other Ambulatory Visit: Payer: Self-pay | Admitting: Internal Medicine

## 2020-12-14 DIAGNOSIS — E1149 Type 2 diabetes mellitus with other diabetic neurological complication: Secondary | ICD-10-CM

## 2020-12-14 DIAGNOSIS — K2100 Gastro-esophageal reflux disease with esophagitis, without bleeding: Secondary | ICD-10-CM

## 2020-12-29 ENCOUNTER — Other Ambulatory Visit: Payer: Self-pay | Admitting: Internal Medicine

## 2020-12-29 DIAGNOSIS — E785 Hyperlipidemia, unspecified: Secondary | ICD-10-CM

## 2021-01-25 ENCOUNTER — Encounter: Payer: Self-pay | Admitting: Podiatry

## 2021-01-25 ENCOUNTER — Other Ambulatory Visit: Payer: Self-pay

## 2021-01-25 ENCOUNTER — Ambulatory Visit: Payer: Medicare PPO | Admitting: Podiatry

## 2021-01-25 DIAGNOSIS — M79674 Pain in right toe(s): Secondary | ICD-10-CM

## 2021-01-25 DIAGNOSIS — E1149 Type 2 diabetes mellitus with other diabetic neurological complication: Secondary | ICD-10-CM | POA: Diagnosis not present

## 2021-01-25 DIAGNOSIS — M79675 Pain in left toe(s): Secondary | ICD-10-CM

## 2021-01-25 DIAGNOSIS — B351 Tinea unguium: Secondary | ICD-10-CM | POA: Diagnosis not present

## 2021-01-25 NOTE — Progress Notes (Signed)
This patient returns to my office for at risk foot care.  This patient requires this care by a professional since this patient will be at risk due to having diabetic neuropathy.   This patient is unable to cut nails himself since the patient cannot reach his nails.These nails are painful walking and wearing shoes.  He presents to the office with his daughter. This patient presents for at risk foot care today.  General Appearance  Alert, conversant and in no acute stress.  Vascular  Dorsalis pedis and posterior tibial  pulses are palpable  bilaterally.  Capillary return is within normal limits  bilaterally. Temperature is within normal limits  bilaterally.  Neurologic  Senn-Weinstein monofilament wire test within normal limits  bilaterally. Muscle power within normal limits bilaterally.  Nails Thick disfigured discolored nails with subungual debris  from hallux to fifth toes bilaterally. No evidence of bacterial infection or drainage bilaterally.  Orthopedic  No limitations of motion  feet .  No crepitus or effusions noted.  No bony pathology or digital deformities noted.  Skin  normotropic skin with no porokeratosis noted bilaterally.  No signs of infections or ulcers noted.     Onychomycosis  Pain in right toes  Pain in left toes  Consent was obtained for treatment procedures.   Mechanical debridement of nails 1-5  bilaterally performed with a nail nipper.  Filed with dremel without incident.    Return office visit    3 months                 Told patient to return for periodic foot care and evaluation due to potential at risk complications.   Gardiner Barefoot DPM

## 2021-01-27 ENCOUNTER — Other Ambulatory Visit: Payer: Self-pay | Admitting: Internal Medicine

## 2021-01-27 DIAGNOSIS — I10 Essential (primary) hypertension: Secondary | ICD-10-CM

## 2021-01-27 DIAGNOSIS — I2510 Atherosclerotic heart disease of native coronary artery without angina pectoris: Secondary | ICD-10-CM

## 2021-02-02 ENCOUNTER — Other Ambulatory Visit: Payer: Self-pay | Admitting: Cardiology

## 2021-02-22 ENCOUNTER — Telehealth (INDEPENDENT_AMBULATORY_CARE_PROVIDER_SITE_OTHER): Payer: Medicare PPO | Admitting: Family

## 2021-02-22 ENCOUNTER — Other Ambulatory Visit: Payer: Self-pay | Admitting: Internal Medicine

## 2021-02-22 ENCOUNTER — Other Ambulatory Visit: Payer: Self-pay | Admitting: Cardiology

## 2021-02-22 ENCOUNTER — Ambulatory Visit: Payer: Medicare PPO | Admitting: Internal Medicine

## 2021-02-22 ENCOUNTER — Encounter: Payer: Self-pay | Admitting: Family

## 2021-02-22 VITALS — Ht 70.0 in | Wt 180.0 lb

## 2021-02-22 DIAGNOSIS — J069 Acute upper respiratory infection, unspecified: Secondary | ICD-10-CM | POA: Diagnosis not present

## 2021-02-22 DIAGNOSIS — E1149 Type 2 diabetes mellitus with other diabetic neurological complication: Secondary | ICD-10-CM

## 2021-02-22 DIAGNOSIS — M79676 Pain in unspecified toe(s): Secondary | ICD-10-CM

## 2021-02-22 DIAGNOSIS — K2100 Gastro-esophageal reflux disease with esophagitis, without bleeding: Secondary | ICD-10-CM

## 2021-02-22 DIAGNOSIS — E785 Hyperlipidemia, unspecified: Secondary | ICD-10-CM

## 2021-02-22 DIAGNOSIS — H8113 Benign paroxysmal vertigo, bilateral: Secondary | ICD-10-CM | POA: Diagnosis not present

## 2021-02-22 DIAGNOSIS — I2510 Atherosclerotic heart disease of native coronary artery without angina pectoris: Secondary | ICD-10-CM

## 2021-02-22 DIAGNOSIS — I10 Essential (primary) hypertension: Secondary | ICD-10-CM

## 2021-02-22 MED ORDER — AMOXICILLIN-POT CLAVULANATE 875-125 MG PO TABS: 1.0000 | ORAL_TABLET | Freq: Two times a day (BID) | ORAL | 0 refills | Status: AC

## 2021-02-22 MED ORDER — MECLIZINE HCL 12.5 MG PO TABS: ORAL_TABLET | ORAL | 0 refills | Status: DC

## 2021-02-22 NOTE — Assessment & Plan Note (Signed)
Suspected sinus infection as this is been going on for 3 weeks and no improvement.  We will send antibiotic of Augmentin 875/125 take 1 p.o. twice daily for 7 days dispense 14.  Patient also to follow-up in 1 week within the office for physical exam and to follow-up on symptoms.

## 2021-02-22 NOTE — Assessment & Plan Note (Signed)
Pain refill given to patient as he is exhibiting vertigo type symptoms.  Advised patient when he does take this medication to be very as it may cause drowsiness and to rise slowly and to keep fall precautions in place.

## 2021-02-22 NOTE — Progress Notes (Signed)
MyChart Video Visit    Virtual Visit via Video Note   This visit type was conducted due to national recommendations for restrictions regarding the COVID-19 Pandemic (e.g. social distancing) in an effort to limit this patient's exposure and mitigate transmission in our community. This patient is at least at moderate risk for complications without adequate follow up. This format is felt to be most appropriate for this patient at this time. Physical exam was limited by quality of the video and audio technology used for the visit. CMA was able to get the patient set up on a video visit.  Patient location: Home. Patient and provider in visit Provider location: Office  I discussed the limitations of evaluation and management by telemedicine and the availability of in person appointments. The patient expressed understanding and agreed to proceed.  Visit Date: 02/22/2021  Today's healthcare provider: Eugenia Pancoast, FNP     Subjective:    Patient ID: Manuel [name], male    DOB: 07/26/1928, 87 y.o.   MRN: [id number]  Chief Complaint  Patient presents with   Dizziness   Nasal Congestion    Dizziness Associated symptoms include congestion. Pertinent negatives include no chest pain, chills, coughing, fever, headaches, sore throat or weakness.   87 y/o male accompanied by daughter with concerns of 3 week h/o nasal congestion, dizziness (no motion sickness). Bil ears feel plugged up with some sinus pressure. Drainage down the throat through the day. No real cough no chest congestion, no sob. Dizziness is mainly with getting up, tries to rise slowly. He does use a cain as well to help with balance.  Alexa play stating  Trying otc antihistamine, unable to recall the name probably benadryl?  Sleeping in chair upright with some relief. Lying down it worsens.   Past Medical History:  Diagnosis Date   Allergic rhinitis    childhood   Asthma    Allergic component   CAD (coronary artery  disease)    s/p MI in 1/11. LHC (1/11) with 99% mCFX treated with 2.5 x 16 Xience V DES; 50% mLAD; EF 55%. ETT-myoview (10/12): 4'51", no significant ST segment changes, EF 56%, no ischemia or infarction.   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 No significant bradycardia.    GERD (gastroesophageal reflux disease)    Hx of cardiovascular stress test    ETT-Myoview (03/2013):  Inf defect on short axis images only (not felt to be significant), EF 68%; low risk.   Hyperlipidemia    Hypertension    MI (myocardial infarction) ([place]) 02/2009   Pneumonia    age 32    Past Surgical History:  Procedure Laterality Date   APPENDECTOMY     CORONARY STENT PLACEMENT      Family History  Problem Relation Age of Onset   Diabetes Mother    Heart failure Mother    Heart attack Father    Asthma Father    Cancer Neg Hx     Social History   Socioeconomic History   Marital status: Single    Spouse name: Not on file   Number of children: Not on file   Years of education: Not on file   Highest education level: Not on file  Occupational History   Occupation: retired  Tobacco Use   Smoking status: Former  Types: Cigarettes    Quit date: 06/09/1966    Years since quitting: 54.7   Smokeless tobacco: Never  Vaping Use   Vaping Use: Never used  Substance and Sexual Activity   Alcohol use: No    Alcohol/week: 0.0 standard drinks   Drug use: No   Sexual activity: Not Currently  Other Topics Concern   Not on file  Social History Narrative   Associates degree.    Social Determinants of Health   Financial Resource Strain: Low Risk    Difficulty of Paying Living Expenses: Not hard at all  Food Insecurity: No Food Insecurity   Worried About Charity fundraiser in the Last Year: Never true   [place] in the Last Year: Never true  Transportation Needs: No Transportation Needs    Lack of Transportation (Medical): No   Lack of Transportation (Non-Medical): No  Physical Activity: Sufficiently Active   Days of Exercise per Week: 5 days   Minutes of Exercise per Session: 60 min  Stress: No Stress Concern Present   Feeling of Stress : Not at all  Social Connections: Socially Integrated   Frequency of Communication with Friends and Family: More than three times a week   Frequency of Social Gatherings with Friends and Family: More than three times a week   Attends Religious Services: More than 4 times per year   Active Member of [employer] or Organizations: Yes   Attends Music therapist: More than 4 times per year   Marital Status: Married  Human resources officer Violence: Not At Risk   Fear of Current or Ex-Partner: No   Emotionally Abused: No   Physically Abused: No   Sexually Abused: No    Outpatient Medications Prior to Visit  Medication Sig Dispense Refill   aspirin EC 81 MG EC tablet Take 1 tablet (81 mg total) by mouth daily.     atorvastatin (LIPITOR) 40 MG tablet TAKE 1 TABLET BY MOUTH EVERY DAY 90 tablet 0   Blood Glucose Monitoring Suppl ([place]) w/Device KIT 1 Device by Does not apply route daily. Use to check blood sugars daily Dx E11.9 1 kit 0   esomeprazole (NEXIUM) 40 MG capsule TAKE 1 CAPSULE BY MOUTH EVERY DAY 90 capsule 1   fluconazole (DIFLUCAN) 200 MG tablet TAKE 1 TABLET BY MOUTH ONE TIME PER WEEK 4 tablet 0   glucose blood (ONETOUCH VERIO) test strip USE TO CHECK BLOOD SUGARS TWICE A DAY 100 each 3   irbesartan (AVAPRO) 150 MG tablet TAKE 1 TABLET BY MOUTH EVERY DAY 90 tablet 0   metoprolol tartrate (LOPRESSOR) 25 MG tablet TAKE 1/2 TABLET BY MOUTH TWICE A DAY 90 tablet 0   Multiple Vitamin (MULTIVITAMIN) tablet Take 1 tablet by mouth daily.     nitroGLYCERIN (NITROSTAT) 0.4 MG SL tablet Place 1 tablet (0.4 mg total) under the tongue every 5 (five) minutes as needed for chest pain. 25 tablet 3   omeprazole (PRILOSEC) 40  MG capsule TAKE 1 CAPSULE BY MOUTH EVERY DAY 90 capsule 0   ONETOUCH DELICA LANCETS 01U MISC Use to help check blood sugars twice a day Dx E11.9 100 each 3   meclizine (ANTIVERT) 12.5 MG tablet Take 1 tablet (12.5 mg total) by mouth 3 (three) times daily as needed for dizziness. 65 tablet 3   metFORMIN (GLUCOPHAGE) 500 MG tablet TAKE 2 TABLETS BY MOUTH TWICE A DAY WITH A MEAL 360 tablet 0   No facility-administered medications  prior to visit.    Allergies  Allergen Reactions   Other     Cats; dust; environmental-itchy eyes, throat irritation    Review of Systems  Constitutional:  Negative for chills and fever.  HENT:  Positive for congestion, ear pain (bil ear fullness) and sinus pain (sinus pressure). Negative for sore throat.   Respiratory:  Negative for cough, shortness of breath and wheezing.   Cardiovascular:  Negative for chest pain.  Neurological:  Positive for dizziness (upon rising from lying down position or changing position suddenly). Negative for speech change, focal weakness, weakness and headaches.  All other systems reviewed and are negative.     Objective:    Physical Exam Constitutional:      General: He is not in acute distress.    Appearance: Normal appearance. He is normal weight. He is not ill-appearing, toxic-appearing or diaphoretic.  HENT:     Head: Normocephalic.  Pulmonary:     Effort: Pulmonary effort is normal.  Neurological:     General: No focal deficit present.     Mental Status: He is alert and oriented to person, place, and time.  Psychiatric:        Mood and Affect: Mood normal.        Behavior: Behavior normal.        Thought Content: Thought content normal.    Ht _0  (1.778 m)    Wt 180 lb (81.6 kg)    BMI 25.83 kg/m  Wt Readings from Last 3 Encounters:  02/22/21 180 lb (81.6 kg)  10/09/20 181 lb (82.1 kg)  10/09/20 181 lb (82.1 kg)       Assessment & Plan:   Problem List Items Addressed This Visit       Respiratory    Upper respiratory infection, acute - Primary    Suspected sinus infection as this is been going on for 3 weeks and no improvement.  We will send antibiotic of Augmentin 875/125 take 1 p.o. twice daily for 7 days dispense 14.  Patient also to follow-up in 1 week within the office for physical exam and to follow-up on symptoms.      Relevant Medications   amoxicillin-clavulanate (AUGMENTIN) 875-125 MG tablet     Nervous and Auditory   Benign paroxysmal positional vertigo due to bilateral vestibular disorder    Pain refill given to patient as he is exhibiting vertigo type symptoms.  Advised patient when he does take this medication to be very as it may cause drowsiness and to rise slowly and to keep fall precautions in place.       Relevant Medications   meclizine (ANTIVERT) 12.5 MG tablet    I have changed Leanard H. Kapler's meclizine. I am also having him start on amoxicillin-clavulanate. Additionally, I am having him maintain his aspirin EC, multivitamin, OneTouch Verio Flex System, OneTouch Delica Lancets 44W, glucose blood, esomeprazole, nitroGLYCERIN, fluconazole, omeprazole, metFORMIN, atorvastatin, metoprolol tartrate, and irbesartan.  Meds ordered this encounter  Medications   meclizine (ANTIVERT) 12.5 MG tablet    Sig: Take one po qhs prn dizziness/vertigo    Dispense:  30 tablet    Refill:  0    Order Specific Question:   Supervising Provider    Answer:   BEDSOLE, AMY E [[id number]]   amoxicillin-clavulanate (AUGMENTIN) 875-125 MG tablet    Sig: Take 1 tablet by mouth 2 (two) times daily for 7 days.    Dispense:  14 tablet    Refill:  0  Order Specific Question:   Supervising Provider    Answer:   Diona Browner, AMY E [[id number]]    I discussed the assessment and treatment plan with the patient. The patient was provided an opportunity to ask questions and all were answered. The patient agreed with the plan and demonstrated an understanding of the instructions.   The patient was advised to  call back or seek an in-person evaluation if the symptoms worsen or if the condition fails to improve as anticipated.  I provided 24 minutes of face-to-face time during this encounter.   Eugenia Pancoast, [place] [facility] [place] [phone] (phone) [phone] (fax)  [place]

## 2021-02-26 ENCOUNTER — Ambulatory Visit: Payer: Medicare PPO | Admitting: Family

## 2021-02-26 ENCOUNTER — Other Ambulatory Visit: Payer: Self-pay

## 2021-02-26 ENCOUNTER — Encounter: Payer: Self-pay | Admitting: Family

## 2021-02-26 VITALS — BP 146/82 | HR 92 | Temp 97.1°F | Ht 70.0 in | Wt 177.0 lb

## 2021-02-26 DIAGNOSIS — J309 Allergic rhinitis, unspecified: Secondary | ICD-10-CM

## 2021-02-26 DIAGNOSIS — H6121 Impacted cerumen, right ear: Secondary | ICD-10-CM | POA: Diagnosis not present

## 2021-02-26 DIAGNOSIS — R42 Dizziness and giddiness: Secondary | ICD-10-CM | POA: Diagnosis not present

## 2021-02-26 DIAGNOSIS — J069 Acute upper respiratory infection, unspecified: Secondary | ICD-10-CM | POA: Diagnosis not present

## 2021-02-26 MED ORDER — MUPIROCIN 2 % EX OINT: 1.0000 "application " | TOPICAL_OINTMENT | Freq: Three times a day (TID) | CUTANEOUS | 1 refills | Status: DC

## 2021-02-26 NOTE — Assessment & Plan Note (Signed)
He manually debrided in office the ear canal with a curette.  Patient tolerated procedure well irrigation was not utilized.  Recommended Debrox as there is some cerumen deeply embedded in the canal patient to follow-up with ENT as necessary as he has went for irrigation prior.

## 2021-02-26 NOTE — Progress Notes (Signed)
Established Patient Office Visit  Subjective:  Patient ID: NYLAN NEVEL, male    DOB: [birth year]  Age: 88 y.o. MRN: [id number]  CC:  Chief Complaint  Patient presents with   Follow-up    HPI KENITH TRICKEL is here today for f/u accompanied by his daughter.  Started augmentin 3 days ago, and has had noted improvement with his symptoms. Nasal congestion much improved as well as ear fullness. Dizziness is improving a bit as well. No fever and or chills. Tolerating augmentin well.   Does have dizziness upon rising, but less so than prior visit.   Past Medical History:  Diagnosis Date   Allergic rhinitis    childhood   Asthma    Allergic component   CAD (coronary artery disease)    s/p MI in 1/11. LHC (1/11) with 99% mCFX treated with 2.5 x 16 Xience V DES; 50% mLAD; EF 55%. ETT-myoview (10/12): 4'51", no significant ST segment changes, EF 56%, no ischemia or infarction.   Cancer ([place])    [place] on right ear   Chronic kidney disease    stones   Diabetes mellitus    Dizziness    Holter (10/12): Frequent PACs and PVCs. No significant bradycardia.    GERD (gastroesophageal reflux disease)    Hx of cardiovascular stress test    ETT-Myoview (03/2013):  Inf defect on short axis images only (not felt to be significant), EF 68%; low risk.   Hyperlipidemia    Hypertension    MI (myocardial infarction) ([place]) 02/2009   Pneumonia    age 39    Past Surgical History:  Procedure Laterality Date   APPENDECTOMY     CORONARY STENT PLACEMENT      Family History  Problem Relation Age of Onset   Diabetes Mother    Heart failure Mother    Heart attack Father    Asthma Father    Cancer Neg Hx     Social History   Socioeconomic History   Marital status: Single    Spouse name: Not on file   Number of children: Not on file   Years of education: Not on file   Highest education level: Not on file  Occupational History   Occupation: retired  Tobacco Use   Smoking status: Former    Types:  Cigarettes    Quit date: 06/09/1966    Years since quitting: 54.7   Smokeless tobacco: Never  Vaping Use   Vaping Use: Never used  Substance and Sexual Activity   Alcohol use: No    Alcohol/week: 0.0 standard drinks   Drug use: No   Sexual activity: Not Currently  Other Topics Concern   Not on file  Social History Narrative   Associates degree.    Social Determinants of Health   Financial Resource Strain: Low Risk    Difficulty of Paying Living Expenses: Not hard at all  Food Insecurity: No Food Insecurity   Worried About Charity fundraiser in the Last Year: Never true   [place] in the Last Year: Never true  Transportation Needs: No Transportation Needs   Lack of Transportation (Medical): No   Lack of Transportation (Non-Medical): No  Physical Activity: Sufficiently Active   Days of Exercise per Week: 5 days   Minutes of Exercise per Session: 60 min  Stress: No Stress Concern Present   Feeling of Stress : Not at all  Social Connections: Socially Integrated   Frequency of Communication with Friends  and Family: More than three times a week   Frequency of Social Gatherings with Friends and Family: More than three times a week   Attends Religious Services: More than 4 times per year   Active Member of Clubs or Organizations: Yes   Attends Music therapist: More than 4 times per year   Marital Status: Married  Human resources officer Violence: Not At Risk   Fear of Current or Ex-Partner: No   Emotionally Abused: No   Physically Abused: No   Sexually Abused: No    Outpatient Medications Prior to Visit  Medication Sig Dispense Refill   amoxicillin-clavulanate (AUGMENTIN) 875-125 MG tablet Take 1 tablet by mouth 2 (two) times daily for 7 days. 14 tablet 0   aspirin EC 81 MG EC tablet Take 1 tablet (81 mg total) by mouth daily.     atorvastatin (LIPITOR) 40 MG tablet TAKE 1 TABLET BY MOUTH EVERY DAY 90 tablet 0   Blood Glucose Monitoring Suppl (Ray) w/Device KIT 1 Device by Does not apply route daily. Use to check blood sugars daily Dx E11.9 1 kit 0   fluconazole (DIFLUCAN) 200 MG tablet TAKE 1 TABLET BY MOUTH ONE TIME PER WEEK 4 tablet 0   glucose blood (ONETOUCH VERIO) test strip USE TO CHECK BLOOD SUGARS TWICE A DAY 100 each 3   irbesartan (AVAPRO) 150 MG tablet TAKE 1 TABLET BY MOUTH EVERY DAY 90 tablet 0   meclizine (ANTIVERT) 12.5 MG tablet Take one po qhs prn dizziness/vertigo 30 tablet 0   metoprolol tartrate (LOPRESSOR) 25 MG tablet TAKE 1/2 TABLET BY MOUTH TWICE A DAY 90 tablet 0   Multiple Vitamin (MULTIVITAMIN) tablet Take 1 tablet by mouth daily.     nitroGLYCERIN (NITROSTAT) 0.4 MG SL tablet Place 1 tablet (0.4 mg total) under the tongue every 5 (five) minutes as needed for chest pain. 25 tablet 3   omeprazole (PRILOSEC) 40 MG capsule TAKE 1 CAPSULE BY MOUTH EVERY DAY 90 capsule 0   ONETOUCH DELICA LANCETS 71Q MISC Use to help check blood sugars twice a day Dx E11.9 100 each 3   metFORMIN (GLUCOPHAGE) 500 MG tablet TAKE 2 TABLETS BY MOUTH TWICE A DAY WITH A MEAL 360 tablet 0   No facility-administered medications prior to visit.    Allergies  Allergen Reactions   Other     Cats; dust; environmental-itchy eyes, throat irritation    ROS Review of Systems  Constitutional:  Negative for chills and fever.  HENT:  Positive for congestion (improving since antibiotics). Negative for ear discharge, ear pain, sinus pressure (improved) and sore throat.   Respiratory:  Positive for cough (wet productive improving). Negative for shortness of breath and wheezing.   Cardiovascular:  Negative for chest pain and palpitations.  Neurological:  Positive for dizziness (upon rising , rises slowly. however improving since medication).     Objective:    Physical Exam Constitutional:      General: He is not in acute distress.    Appearance: Normal appearance. He is normal weight. He is not ill-appearing, toxic-appearing or  diaphoretic.  HENT:     Head: Normocephalic.     Right Ear: Hearing and ear canal normal. A middle ear effusion (clear) is present. There is impacted cerumen. Tympanic membrane is not erythematous or bulging.     Left Ear: Hearing, tympanic membrane and ear canal normal.     Ears:     Comments: Removed manually from inner with curette  ear canal, pt tolerated well , however some residual packed deep in canal.     Nose: Nose normal.     Mouth/Throat:     Mouth: Mucous membranes are moist.  Neurological:     Mental Status: He is alert.     Motor: Motor function is intact.     Coordination: Coordination is intact.     Gait: Gait is intact.    BP (!) 146/82    Pulse 92    Temp (!) 97.1 F (36.2 C) (Temporal)    Ht '5\' 10"'  (1.778 m)    Wt 177 lb (80.3 kg)    SpO2 91%    BMI 25.40 kg/m  Wt Readings from Last 3 Encounters:  02/26/21 177 lb (80.3 kg)  02/22/21 180 lb (81.6 kg)  10/09/20 181 lb (82.1 kg)     Health Maintenance Due  Topic Date Due   Zoster Vaccines- Shingrix (1 of 2) Never done   INFLUENZA VACCINE  09/24/2020   COVID-19 Vaccine (5 - Booster for Pfizer series) 11/12/2020    There are no preventive care reminders to display for this patient.  Lab Results  Component Value Date   TSH 2.26 03/22/2020   Lab Results  Component Value Date   WBC 4.1 03/22/2020   HGB 14.2 03/22/2020   HCT 42.5 03/22/2020   MCV 93.9 03/22/2020   PLT 156.0 03/22/2020   Lab Results  Component Value Date   NA 135 10/09/2020   K 5.0 10/09/2020   CO2 33 (H) 10/09/2020   GLUCOSE 131 (H) 10/09/2020   BUN 20 10/09/2020   CREATININE 0.98 10/09/2020   BILITOT 0.5 10/09/2020   ALKPHOS 112 10/09/2020   AST 47 (H) 10/09/2020   ALT 47 10/09/2020   PROT 7.3 10/09/2020   ALBUMIN 4.0 10/09/2020   CALCIUM 9.8 10/09/2020   GFR 67.91 10/09/2020   Lab Results  Component Value Date   HGBA1C 6.8 (H) 10/09/2020      Assessment & Plan:   Problem List Items Addressed This Visit        Respiratory   Allergic rhinitis    Continue with antihistamine.      Upper respiratory infection, acute    Currently improving continue with antibiotic as prescribed.  Rise slowly upon standing as some occasional dyspnea still resolving.  Follow-up if symptoms worsen and/or fail to improve.      Relevant Medications   mupirocin ointment (BACTROBAN) 2 %     Nervous and Auditory   Impacted cerumen of right ear    He manually debrided in office the ear canal with a curette.  Patient tolerated procedure well irrigation was not utilized.  Recommended Debrox as there is some cerumen deeply embedded in the canal patient to follow-up with ENT as necessary as he has went for irrigation prior.        Other   Dizziness - Primary    Resolving with antibiotics, however patient informed to rise and stand slowly especially from a lying position.  Patient did have some impacted cerumen patient to follow-up with the ENT she if needed, also suggested Debrox.  Manually debrided right ear canal with curette patient tolerated well this may help with the dizziness as well.  Meclizine as needed for history of vertigo and for dizziness if needed.  However caution was used as it can cause fatigue and drowsiness       Meds ordered this encounter  Medications   mupirocin ointment (BACTROBAN) 2 %  Sig: Apply 1 application topically 3 (three) times daily.    Dispense:  22 g    Refill:  1    Order Specific Question:   Supervising Provider    Answer:   BEDSOLE, AMY E [[id number]]    Follow-up: Return in about 6 months (around 08/26/2021) for for regular f/u .    Eugenia Pancoast, FNP

## 2021-02-26 NOTE — Patient Instructions (Addendum)
Continue antibiotic as prescribed.   It was a pleasure seeing you today! Please do not hesitate to reach out with any questions and or concerns.  Regards,   Eugenia Pancoast FNP-C

## 2021-02-26 NOTE — Assessment & Plan Note (Signed)
Continue with antihistamine.

## 2021-02-26 NOTE — Assessment & Plan Note (Signed)
Resolving with antibiotics, however patient informed to rise and stand slowly especially from a lying position.  Patient did have some impacted cerumen patient to follow-up with the ENT she if needed, also suggested Debrox.  Manually debrided right ear canal with curette patient tolerated well this may help with the dizziness as well.  Meclizine as needed for history of vertigo and for dizziness if needed.  However caution was used as it can cause fatigue and drowsiness

## 2021-02-26 NOTE — Assessment & Plan Note (Signed)
Currently improving continue with antibiotic as prescribed.  Rise slowly upon standing as some occasional dyspnea still resolving.  Follow-up if symptoms worsen and/or fail to improve.

## 2021-03-14 ENCOUNTER — Ambulatory Visit: Payer: Medicare PPO | Admitting: Cardiology

## 2021-03-28 ENCOUNTER — Other Ambulatory Visit: Payer: Self-pay

## 2021-03-28 ENCOUNTER — Ambulatory Visit: Payer: Medicare PPO | Admitting: Cardiology

## 2021-03-28 ENCOUNTER — Encounter: Payer: Self-pay | Admitting: Cardiology

## 2021-03-28 VITALS — BP 138/62 | HR 66 | Wt 178.0 lb

## 2021-03-28 DIAGNOSIS — I10 Essential (primary) hypertension: Secondary | ICD-10-CM | POA: Diagnosis not present

## 2021-03-28 DIAGNOSIS — E785 Hyperlipidemia, unspecified: Secondary | ICD-10-CM | POA: Diagnosis not present

## 2021-03-28 DIAGNOSIS — I2510 Atherosclerotic heart disease of native coronary artery without angina pectoris: Secondary | ICD-10-CM | POA: Diagnosis not present

## 2021-03-28 DIAGNOSIS — K7581 Nonalcoholic steatohepatitis (NASH): Secondary | ICD-10-CM

## 2021-03-28 NOTE — Assessment & Plan Note (Signed)
Chronically elevated ALT and AST.  Interestingly, his daughter has had the same type of profile.  No changes made.  They are not at the 3 times upper limit of normal.

## 2021-03-28 NOTE — Progress Notes (Signed)
Cardiology Office Note:    Date:  03/28/2021   ID:  Manuel [name], DOB 1928-03-13, MRN [id number]  PCP:  Manuel Lima, MD   [facility] Providers Cardiologist:  Manuel Furbish, MD     Referring MD: Manuel Lima, MD    History of Present Illness:    Manuel [name] is a 88 y.o. male here for the follow-up of coronary artery disease status postmyocardial infarction in January 2011 with diabetes hypertension hyperlipidemia.  Has had a longstanding history of vertiginous-like symptoms.  Previously at 1 point he felt like getting out of bed he was going in circles.  Had been given Epley's maneuver from physical therapy.  Orthostatics previously done have been normal.  Enjoys walking [employer] in the past [place] walks 5 days a week three quarters of a mile.  Also enjoys golfing.  He slowed down a little bit around Thanksgiving when he was having more vertigo-like symptoms.  Overall he is feeling much clearer now.  Here with his daughter for historical supplementation.  1. Asthma: Allergic component 2. CAD: s/p MI in 1/11.  LHC (1/11) with 99% mCFX treated with 2.5 x 16 Xience V DES; 50% mLAD; EF 55%.  ETT-myoview (10/12): 4'51", no significant ST segment changes, EF 56%, no ischemia or infarction. ETT-Cardiolite (2/15): 4'15", probably normal with no ischemia/infarction, EF 68%.  3. GERD 4. Appendectomy 5. HTN 6. Hyperlipidemia 7. Diabetes mellitus type II 8. Lightheaded spells: Holter (10/12): Frequent PACs and PVCs.  No significant bradycardia.  Probably due primarily to BPPV.      FH: Parents lived into their 66s.  No premature CAD.     SH: Moved from [place], [place] to [place].  Married, originally from [place].  Now living in daughter's house.  Quit smoking 1990, retired Hotel manager, occasional smoker.      Prior patient of Dr. Claris Gladden  Past Medical History:  Diagnosis Date   Allergic rhinitis    childhood   Asthma    Allergic component   CAD (coronary artery  disease)    s/p MI in 1/11. LHC (1/11) with 99% mCFX treated with 2.5 x 16 Xience V DES; 50% mLAD; EF 55%. ETT-myoview (10/12): 4'51", no significant ST segment changes, EF 56%, no ischemia or infarction.   Cancer ([place])    [place] on right ear   Chronic kidney disease    stones   Diabetes mellitus    Dizziness    Holter (10/12): Frequent PACs and PVCs. No significant bradycardia.    GERD (gastroesophageal reflux disease)    Hx of cardiovascular stress test    ETT-Myoview (03/2013):  Inf defect on short axis images only (not felt to be significant), EF 68%; low risk.   Hyperlipidemia    Hypertension    MI (myocardial infarction) ([place]) 02/2009   Pneumonia    age 24    Past Surgical History:  Procedure Laterality Date   APPENDECTOMY     CORONARY STENT PLACEMENT      Current Medications: Current Meds  Medication Sig   aspirin EC 81 MG EC tablet Take 1 tablet (81 mg total) by mouth daily.   atorvastatin (LIPITOR) 40 MG tablet TAKE 1 TABLET BY MOUTH EVERY DAY   Blood Glucose Monitoring Suppl ([place]) w/Device KIT 1 Device by Does not apply route daily. Use to check blood sugars daily Dx E11.9   glucose blood (ONETOUCH VERIO) test strip USE TO CHECK BLOOD SUGARS TWICE A DAY  irbesartan (AVAPRO) 150 MG tablet TAKE 1 TABLET BY MOUTH EVERY DAY   meclizine (ANTIVERT) 12.5 MG tablet Take one po qhs prn dizziness/vertigo   metFORMIN (GLUCOPHAGE) 500 MG tablet TAKE 2 TABLETS BY MOUTH TWICE A DAY WITH A MEAL   metoprolol tartrate (LOPRESSOR) 25 MG tablet TAKE 1/2 TABLET BY MOUTH TWICE A DAY   Multiple Vitamin (MULTIVITAMIN) tablet Take 1 tablet by mouth daily.   mupirocin ointment (BACTROBAN) 2 % Apply 1 application topically 3 (three) times daily.   nitroGLYCERIN (NITROSTAT) 0.4 MG SL tablet Place 1 tablet (0.4 mg total) under the tongue every 5 (five) minutes as needed for chest pain.   omeprazole (PRILOSEC) 40 MG capsule TAKE 1 CAPSULE BY MOUTH EVERY DAY   ONETOUCH DELICA  LANCETS 47W MISC Use to help check blood sugars twice a day Dx E11.9     Allergies:   Other   Social History   Socioeconomic History   Marital status: Single    Spouse name: Not on file   Number of children: Not on file   Years of education: Not on file   Highest education level: Not on file  Occupational History   Occupation: retired  Tobacco Use   Smoking status: Former    Types: Cigarettes    Quit date: 06/09/1966    Years since quitting: 54.8   Smokeless tobacco: Never  Vaping Use   Vaping Use: Never used  Substance and Sexual Activity   Alcohol use: No    Alcohol/week: 0.0 standard drinks   Drug use: No   Sexual activity: Not Currently  Other Topics Concern   Not on file  Social History Narrative   Associates degree.    Social Determinants of Health   Financial Resource Strain: Low Risk    Difficulty of Paying Living Expenses: Not hard at all  Food Insecurity: No Food Insecurity   Worried About Charity fundraiser in the Last Year: Never true   [place] in the Last Year: Never true  Transportation Needs: No Transportation Needs   Lack of Transportation (Medical): No   Lack of Transportation (Non-Medical): No  Physical Activity: Sufficiently Active   Days of Exercise per Week: 5 days   Minutes of Exercise per Session: 60 min  Stress: No Stress Concern Present   Feeling of Stress : Not at all  Social Connections: Socially Integrated   Frequency of Communication with Friends and Family: More than three times a week   Frequency of Social Gatherings with Friends and Family: More than three times a week   Attends Religious Services: More than 4 times per year   Active Member of [employer] or Organizations: Yes   Attends Music therapist: More than 4 times per year   Marital Status: Married     Family History: The patient's family history includes Asthma in his father; Diabetes in his mother; Heart attack in his father; Heart failure in his  mother. There is no history of Cancer.  ROS:   Please see the history of present illness.    No fevers chills nausea vomiting syncope all other systems reviewed and are negative.  EKGs/Labs/Other Studies Reviewed:    The following studies were reviewed today: Nuclear stress test 2015-low risk no significant ischemia identified.  EKG:  EKG is  ordered today.  The ekg ordered today demonstrates sinus rhythm first-degree AV block 214 ms with heart rate of 66 bpm  Recent Labs: 10/09/2020: ALT 47; BUN 20;  Creatinine, Ser 0.98; Potassium 5.0; Sodium 135  Recent Lipid Panel    Component Value Date/Time   CHOL 169 03/22/2020 1015   TRIG 151.0 (H) 03/22/2020 1015   HDL 50.30 03/22/2020 1015   CHOLHDL 3 03/22/2020 1015   VLDL 30.2 03/22/2020 1015   LDLCALC 88 03/22/2020 1015     Risk Assessment/Calculations:              Physical Exam:    VS:  BP 138/62    Pulse 66    Wt 178 lb (80.7 kg)    SpO2 94%    BMI 25.54 kg/m     Wt Readings from Last 3 Encounters:  03/28/21 178 lb (80.7 kg)  02/26/21 177 lb (80.3 kg)  02/22/21 180 lb (81.6 kg)     GEN:  Well nourished, well developed in no acute distress HEENT: Normal NECK: No JVD; No carotid bruits LYMPHATICS: No lymphadenopathy CARDIAC: RRR, no murmurs, no rubs, gallops RESPIRATORY:  Clear to auscultation without rales, wheezing or rhonchi  ABDOMEN: Soft, non-tender, non-distended MUSCULOSKELETAL:  No edema; No deformity  SKIN: Warm and dry NEUROLOGIC:  Alert and oriented x 3 PSYCHIATRIC:  Normal affect   ASSESSMENT:    1. Atherosclerosis of native coronary artery of native heart without angina pectoris   2. Hyperlipidemia with target LDL less than 100   3. Essential hypertension, benign   4. Nonalcoholic steatohepatitis (NASH)    PLAN:    In order of problems listed above:  Coronary atherosclerosis Prior circumflex stent, DES in 2011 in the setting of MI.  Stable no anginal symptoms.  Previous stress test was  reassuring.  His daughter used to run the phase 3 cardiac rehab program at [facility].  Trying to walk. Does a good job. NTG only 3 times since 2011.   Hyperlipidemia with target LDL less than 100 On atorvastatin 40 mg a day high intensity statin.  Doing well without any myalgias.  Previously check LDL 88.  Goal is less than 70.  Essential hypertension, benign At 1 point blood pressures were quite low 103/69 for instance.  We had previously discontinued his HCTZ 12.5 and decrease his irbesartan 150.  Seems to be quite stable on this regimen.  He is also on metoprolol 25 mg.  Nonalcoholic steatohepatitis (NASH) Chronically elevated ALT and AST.  Interestingly, his daughter has had the same type of profile.  No changes made.  They are not at the 3 times upper limit of normal.         Medication Adjustments/Labs and Tests Ordered: Current medicines are reviewed at length with the patient today.  Concerns regarding medicines are outlined above.  Orders Placed This Encounter  Procedures   EKG 12-Lead   No orders of the defined types were placed in this encounter.   Patient Instructions  Medication Instructions:  Your physician recommends that you continue on your current medications as directed. Please refer to the Current Medication list given to you today.   Labwork: None today  Testing/Procedures: None today  Follow-Up: 1 year  Any Other Special Instructions Will Be Listed Below (If Applicable).  If you need a refill on your cardiac medications before your next appointment, please call your pharmacy.    Signed, Manuel Furbish, MD  03/28/2021 1:35 PM    [facility]

## 2021-03-28 NOTE — Assessment & Plan Note (Addendum)
Prior circumflex stent, DES in 2011 in the setting of MI.  Stable no anginal symptoms.  Previous stress test was reassuring.  His daughter used to run the phase 3 cardiac rehab program at [facility].  Trying to walk. Does a good job. NTG only 3 times since 2011.

## 2021-03-28 NOTE — Patient Instructions (Signed)
Medication Instructions:  Your physician recommends that you continue on your current medications as directed. Please refer to the Current Medication list given to you today.   Labwork: None today  Testing/Procedures: None today  Follow-Up: 1 year  Any Other Special Instructions Will Be Listed Below (If Applicable).  If you need a refill on your cardiac medications before your next appointment, please call your pharmacy.  

## 2021-03-28 NOTE — Assessment & Plan Note (Signed)
At 1 point blood pressures were quite low 103/69 for instance.  We had previously discontinued his HCTZ 12.5 and decrease his irbesartan 150.  Seems to be quite stable on this regimen.  He is also on metoprolol 25 mg.

## 2021-03-28 NOTE — Assessment & Plan Note (Signed)
On atorvastatin 40 mg a day high intensity statin.  Doing well without any myalgias.  Previously check LDL 88.  Goal is less than 70.

## 2021-05-03 ENCOUNTER — Other Ambulatory Visit: Payer: Self-pay

## 2021-05-03 ENCOUNTER — Encounter: Payer: Self-pay | Admitting: Podiatry

## 2021-05-03 ENCOUNTER — Ambulatory Visit (INDEPENDENT_AMBULATORY_CARE_PROVIDER_SITE_OTHER): Payer: Medicare PPO | Admitting: Podiatry

## 2021-05-03 DIAGNOSIS — E1149 Type 2 diabetes mellitus with other diabetic neurological complication: Secondary | ICD-10-CM

## 2021-05-03 DIAGNOSIS — M79674 Pain in right toe(s): Secondary | ICD-10-CM | POA: Diagnosis not present

## 2021-05-03 DIAGNOSIS — B351 Tinea unguium: Secondary | ICD-10-CM | POA: Diagnosis not present

## 2021-05-03 DIAGNOSIS — M79675 Pain in left toe(s): Secondary | ICD-10-CM | POA: Diagnosis not present

## 2021-05-03 NOTE — Progress Notes (Signed)
This patient returns to my office for at risk foot care.  This patient requires this care by a professional since this patient will be at risk due to having diabetic neuropathy.   This patient is unable to cut nails himself since the patient cannot reach his nails.These nails are painful walking and wearing shoes.  He presents to the office with his daughter. This patient presents for at risk foot care today. ? ?General Appearance  Alert, conversant and in no acute stress. ? ?Vascular  Dorsalis pedis and posterior tibial  pulses are  weakly palpable  bilaterally.  Capillary return is within normal limits  bilaterally. Temperature is within normal limits  bilaterally. ? ?Neurologic  Senn-Weinstein monofilament wire test within normal limits  bilaterally. Muscle power within normal limits bilaterally. ? ?Nails Thick disfigured discolored nails with subungual debris  from hallux to fifth toes bilaterally. No evidence of bacterial infection or drainage bilaterally. ? ?Orthopedic  No limitations of motion  feet .  No crepitus or effusions noted.  No bony pathology or digital deformities noted. ? ?Skin  normotropic skin with no porokeratosis noted bilaterally.  No signs of infections or ulcers noted.    ? ?Onychomycosis  Pain in right toes  Pain in left toes ? ?Consent was obtained for treatment procedures.   Mechanical debridement of nails 1-5  bilaterally performed with a nail nipper.  Filed with dremel without incident.  ? ? ?Return office visit    3 months                 Told patient to return for periodic foot care and evaluation due to potential at risk complications. ? ? ?Gardiner Barefoot DPM   ?

## 2021-05-15 DIAGNOSIS — M25551 Pain in right hip: Secondary | ICD-10-CM | POA: Diagnosis not present

## 2021-06-18 DIAGNOSIS — H33312 Horseshoe tear of retina without detachment, left eye: Secondary | ICD-10-CM | POA: Diagnosis not present

## 2021-06-18 DIAGNOSIS — H5203 Hypermetropia, bilateral: Secondary | ICD-10-CM | POA: Diagnosis not present

## 2021-06-18 DIAGNOSIS — E7800 Pure hypercholesterolemia, unspecified: Secondary | ICD-10-CM | POA: Diagnosis not present

## 2021-06-18 DIAGNOSIS — E119 Type 2 diabetes mellitus without complications: Secondary | ICD-10-CM | POA: Diagnosis not present

## 2021-06-18 DIAGNOSIS — H524 Presbyopia: Secondary | ICD-10-CM | POA: Diagnosis not present

## 2021-06-18 DIAGNOSIS — H04123 Dry eye syndrome of bilateral lacrimal glands: Secondary | ICD-10-CM | POA: Diagnosis not present

## 2021-06-18 DIAGNOSIS — H35033 Hypertensive retinopathy, bilateral: Secondary | ICD-10-CM | POA: Diagnosis not present

## 2021-06-18 DIAGNOSIS — I10 Essential (primary) hypertension: Secondary | ICD-10-CM | POA: Diagnosis not present

## 2021-06-18 DIAGNOSIS — H52223 Regular astigmatism, bilateral: Secondary | ICD-10-CM | POA: Diagnosis not present

## 2021-06-18 LAB — HM DIABETES EYE EXAM

## 2021-06-20 ENCOUNTER — Ambulatory Visit (INDEPENDENT_AMBULATORY_CARE_PROVIDER_SITE_OTHER): Payer: Medicare PPO

## 2021-06-20 ENCOUNTER — Encounter: Payer: Self-pay | Admitting: Internal Medicine

## 2021-06-20 ENCOUNTER — Ambulatory Visit (INDEPENDENT_AMBULATORY_CARE_PROVIDER_SITE_OTHER): Payer: Medicare PPO | Admitting: Internal Medicine

## 2021-06-20 VITALS — BP 124/80 | HR 89 | Temp 97.9°F | Ht 70.0 in | Wt 182.2 lb

## 2021-06-20 DIAGNOSIS — E1149 Type 2 diabetes mellitus with other diabetic neurological complication: Secondary | ICD-10-CM

## 2021-06-20 DIAGNOSIS — R5383 Other fatigue: Secondary | ICD-10-CM

## 2021-06-20 DIAGNOSIS — Z0001 Encounter for general adult medical examination with abnormal findings: Secondary | ICD-10-CM

## 2021-06-20 DIAGNOSIS — R052 Subacute cough: Secondary | ICD-10-CM | POA: Diagnosis not present

## 2021-06-20 DIAGNOSIS — I10 Essential (primary) hypertension: Secondary | ICD-10-CM | POA: Diagnosis not present

## 2021-06-20 DIAGNOSIS — R059 Cough, unspecified: Secondary | ICD-10-CM | POA: Diagnosis not present

## 2021-06-20 DIAGNOSIS — E785 Hyperlipidemia, unspecified: Secondary | ICD-10-CM

## 2021-06-20 DIAGNOSIS — R001 Bradycardia, unspecified: Secondary | ICD-10-CM | POA: Insufficient documentation

## 2021-06-20 DIAGNOSIS — K7581 Nonalcoholic steatohepatitis (NASH): Secondary | ICD-10-CM | POA: Diagnosis not present

## 2021-06-20 LAB — BASIC METABOLIC PANEL
BUN: 22 mg/dL (ref 6–23)
CO2: 33 mEq/L — ABNORMAL HIGH (ref 19–32)
Calcium: 9.4 mg/dL (ref 8.4–10.5)
Chloride: 98 mEq/L (ref 96–112)
Creatinine, Ser: 0.97 mg/dL (ref 0.40–1.50)
GFR: 68.42 mL/min (ref >60.00)
Glucose, Bld: 119 mg/dL — ABNORMAL HIGH (ref 70–99)
Potassium: 4.1 mEq/L (ref 3.5–5.1)
Sodium: 137 mEq/L (ref 135–145)

## 2021-06-20 LAB — URINALYSIS, ROUTINE W REFLEX MICROSCOPIC
Bilirubin Urine: NEGATIVE
Hgb urine dipstick: NEGATIVE
Ketones, ur: NEGATIVE
Leukocytes,Ua: NEGATIVE
Nitrite: NEGATIVE
Specific Gravity, Urine: 1.015 (ref 1.000–1.030)
Total Protein, Urine: NEGATIVE
Urine Glucose: NEGATIVE
Urobilinogen, UA: 1 (ref 0.0–1.0)
pH: 6 (ref 5.0–8.0)

## 2021-06-20 LAB — HEPATIC FUNCTION PANEL
ALT: 25 U/L (ref 0–53)
AST: 27 U/L (ref 0–37)
Albumin: 4 g/dL (ref 3.5–5.2)
Alkaline Phosphatase: 86 U/L (ref 39–117)
Bilirubin, Direct: 0.1 mg/dL (ref 0.0–0.3)
Total Bilirubin: 0.4 mg/dL (ref 0.2–1.2)
Total Protein: 7.2 g/dL (ref 6.0–8.3)

## 2021-06-20 LAB — HEMOGLOBIN A1C: Hgb A1c MFr Bld: 6.4 % (ref 4.6–6.5)

## 2021-06-20 LAB — MICROALBUMIN / CREATININE URINE RATIO
Creatinine,U: 80.8 mg/dL
Microalb Creat Ratio: 2.9 mg/g (ref 0.0–30.0)
Microalb, Ur: 2.4 mg/dL — ABNORMAL HIGH (ref 0.0–1.9)

## 2021-06-20 LAB — LIPID PANEL
Cholesterol: 155 mg/dL (ref 0–200)
HDL: 51.6 mg/dL (ref >39.00)
LDL Cholesterol: 83 mg/dL (ref 0–99)
NonHDL: 103.44
Total CHOL/HDL Ratio: 3
Triglycerides: 100 mg/dL (ref 0.0–149.0)
VLDL: 20 mg/dL (ref 0.0–40.0)

## 2021-06-20 LAB — TROPONIN I (HIGH SENSITIVITY): High Sens Troponin I: 8 ng/L (ref 2–17)

## 2021-06-20 LAB — TSH: TSH: 2.13 u[IU]/mL (ref 0.35–5.50)

## 2021-06-20 NOTE — Progress Notes (Signed)
? ?Subjective:  ?Patient ID: Manuel [name], male    DOB: Dec 31, 1928  Age: 88 y.o. MRN: [id number] ? ?CC: Annual Exam, Cough, Coronary Artery Disease, Diabetes, and Hypertension ? ? ?HPI ?Manuel [name] presents for a CPX and f/up -  ? ?He complains of a one day hx of fatigue. ? ?Outpatient Medications Prior to Visit  ?Medication Sig Dispense Refill  ? aspirin EC 81 MG EC tablet Take 1 tablet (81 mg total) by mouth daily.    ? atorvastatin (LIPITOR) 40 MG tablet TAKE 1 TABLET BY MOUTH EVERY DAY 90 tablet 0  ? Blood Glucose Monitoring Suppl (ONETOUCH VERIO FLEX SYSTEM) w/Device KIT 1 Device by Does not apply route daily. Use to check blood sugars daily Dx E11.9 1 kit 0  ? glucose blood (ONETOUCH VERIO) test strip USE TO CHECK BLOOD SUGARS TWICE A DAY 100 each 3  ? irbesartan (AVAPRO) 150 MG tablet TAKE 1 TABLET BY MOUTH EVERY DAY 90 tablet 0  ? meclizine (ANTIVERT) 12.5 MG tablet Take one po qhs prn dizziness/vertigo 30 tablet 0  ? metoprolol tartrate (LOPRESSOR) 25 MG tablet TAKE 1/2 TABLET BY MOUTH TWICE A DAY 90 tablet 0  ? Multiple Vitamin (MULTIVITAMIN) tablet Take 1 tablet by mouth daily.    ? mupirocin ointment (BACTROBAN) 2 % Apply 1 application topically 3 (three) times daily. 22 g 1  ? nitroGLYCERIN (NITROSTAT) 0.4 MG SL tablet Place 1 tablet (0.4 mg total) under the tongue every 5 (five) minutes as needed for chest pain. 25 tablet 3  ? omeprazole (PRILOSEC) 40 MG capsule TAKE 1 CAPSULE BY MOUTH EVERY DAY 90 capsule 0  ? ONETOUCH DELICA LANCETS 78G MISC Use to help check blood sugars twice a day Dx E11.9 100 each 3  ? metFORMIN (GLUCOPHAGE) 500 MG tablet TAKE 2 TABLETS BY MOUTH TWICE A DAY WITH A MEAL 360 tablet 0  ? fluconazole (DIFLUCAN) 200 MG tablet TAKE 1 TABLET BY MOUTH ONE TIME PER WEEK (Patient not taking: Reported on 03/28/2021) 4 tablet 0  ? ?No facility-administered medications prior to visit.  ? ? ?ROS ?Review of Systems  ?Constitutional:  Positive for fatigue. Negative for chills, diaphoresis, fever and  unexpected weight change.  ?HENT:  Negative for sore throat.   ?Respiratory:  Positive for cough (chronic cough). Negative for chest tightness, shortness of breath and wheezing.   ?Cardiovascular:  Negative for chest pain, palpitations and leg swelling.  ?Gastrointestinal:  Negative for abdominal pain, diarrhea, nausea and vomiting.  ?Endocrine: Negative.   ?Genitourinary:  Negative for difficulty urinating.  ?Musculoskeletal: Negative.   ?Skin: Negative.  Negative for color change.  ?Allergic/Immunologic: Negative.   ?Neurological:  Negative for dizziness, weakness, light-headedness and headaches.  ?Hematological:  Negative for adenopathy. Does not bruise/bleed easily.  ?Psychiatric/Behavioral: Negative.    ? ?Objective:  ?BP 124/80   Pulse 89   Temp 97.9 ?F (36.6 ?C) (Oral)   Ht 5' 10"  (1.778 m)   Wt 182 lb 3.2 oz (82.6 kg)   SpO2 94%   BMI 26.14 kg/m?  ? ?BP Readings from Last 3 Encounters:  ?06/20/21 124/80  ?03/28/21 138/62  ?02/26/21 (!) 146/82  ? ? ?Wt Readings from Last 3 Encounters:  ?06/20/21 182 lb 3.2 oz (82.6 kg)  ?03/28/21 178 lb (80.7 kg)  ?02/26/21 177 lb (80.3 kg)  ? ? ?Physical Exam ?Vitals reviewed.  ?Constitutional:   ?   General: He is not in acute distress. ?   Appearance: He is not ill-appearing, toxic-appearing  or diaphoretic.  ?HENT:  ?   Nose: Nose normal.  ?   Mouth/Throat:  ?   Mouth: Mucous membranes are moist.  ?Eyes:  ?   General: No scleral icterus. ?   Conjunctiva/sclera: Conjunctivae normal.  ?Cardiovascular:  ?   Rate and Rhythm: Normal rate and regular rhythm. Occasional Extrasystoles are present. ?   Heart sounds: Normal heart sounds, S1 normal and S2 normal.  ?  No friction rub. No gallop.  ?   Comments: EKG- ?SR with 1 st degree AV block, 63 bpm ?LAD ?Flat T waves in V5V6 is new ?Inferior infarct pattern is old ?Pulmonary:  ?   Effort: Pulmonary effort is normal.  ?   Breath sounds: No stridor. No wheezing, rhonchi or rales.  ?Abdominal:  ?   General: Abdomen is flat.  ?    Palpations: There is no mass.  ?   Tenderness: There is no abdominal tenderness. There is no guarding.  ?   Hernia: No hernia is present.  ?Musculoskeletal:  ?   Cervical back: Neck supple.  ?   Right lower leg: No edema.  ?   Left lower leg: No edema.  ?Skin: ?   General: Skin is warm and dry.  ?   Findings: No rash.  ?Neurological:  ?   General: No focal deficit present.  ?   Mental Status: He is alert.  ?Psychiatric:     ?   Mood and Affect: Mood normal.     ?   Behavior: Behavior normal.  ? ? ?Lab Results  ?Component Value Date  ? WBC 4.1 03/22/2020  ? HGB 14.2 03/22/2020  ? HCT 42.5 03/22/2020  ? PLT 156.0 03/22/2020  ? GLUCOSE 119 (H) 06/20/2021  ? CHOL 155 06/20/2021  ? TRIG 100.0 06/20/2021  ? HDL 51.60 06/20/2021  ? [place] 83 06/20/2021  ? ALT 25 06/20/2021  ? AST 27 06/20/2021  ? NA 137 06/20/2021  ? K 4.1 06/20/2021  ? CL 98 06/20/2021  ? CREATININE 0.97 06/20/2021  ? BUN 22 06/20/2021  ? CO2 33 (H) 06/20/2021  ? TSH 2.13 06/20/2021  ? INR 1.3 (H) 03/08/2019  ? HGBA1C 6.4 06/20/2021  ? MICROALBUR 2.4 (H) 06/20/2021  ? ? ?US Abdomen Complete ? ?Result Date: 04/15/2018 ?CLINICAL DATA:  RIGHT upper quadrant abdominal pain. EXAM: ABDOMEN ULTRASOUND COMPLETE COMPARISON:  None. FINDINGS: Gallbladder: No gallstones or wall thickening visualized. No sonographic Murphy sign noted by sonographer. Common bile duct: Diameter: Normal at 4 mm Liver: Heterogeneous liver echotexture. No focal lesion. No duct dilatation. Portal vein is patent on color Doppler imaging with normal direction of blood flow towards the liver. IVC: No abnormality visualized. Pancreas: Visualized portion unremarkable. Spleen: Size and appearance within normal limits. Right Kidney: Length: 10.3 cm. 2 anechoic cysts in lower pole measuring 1.73.6 cm. Left Kidney: Length: 9.9 cm. Large anechoic cysts measuring 5 cm. Second smaller 1 mm anechoic cyst. Abdominal aorta: No aneurysm visualized. Other findings: None. IMPRESSION: 1. No evidence of  cholecystitis.  No acute findings. 2. Benign appearing renal cysts. Electronically Signed   By: Suzy Bouchard M.D.   On: 04/15/2018 11:50  ? ?DG Chest 2 View ? ?Result Date: 06/20/2021 ?CLINICAL DATA:  Recent cough.  History of asthma. EXAM: CHEST - 2 VIEW COMPARISON:  Chest two views 07/02/2010 FINDINGS: Cardiac silhouette and mediastinal contours are within normal limits mild calcification within aortic arch. Left-greater-than-right basilar linear scarring appears similar to prior. There is a likely calcified nodule  overlying the mid to upper lateral left lung that is unchanged from prior, benign. No definite pleural effusion is seen. There is left basilar heterogeneous airspace opacification that appears unchanged. No pneumothorax. Moderate multilevel degenerative bridging osteophytes of the thoracic spine. IMPRESSION: There are left-greater-than-right basilar linear opacities with left basilar heterogeneous opacities on frontal view however these all appear unchanged from 07/02/2010 and appear to represent scarring. No definite new airspace opacity to indicate pneumonia. Electronically Signed   By: Yvonne Kendall M.D.   On: 06/20/2021 15:50    ? ?Assessment & Plan:  ? ?Manuel [name] was seen today for annual exam, cough, coronary artery disease, diabetes and hypertension. ? ?Diagnoses and all orders for this visit: ? ?Essential hypertension, benign- His BP is well controlled. ?-     Basic metabolic panel; Future ?-     TSH; Future ?-     Urinalysis, Routine w reflex microscopic; Future ?-     EKG 12-Lead ?-     Urinalysis, Routine w reflex microscopic ?-     TSH ?-     Basic metabolic panel ? ?Nonalcoholic steatohepatitis (NASH)- LFT's are normal. ? ?Diabetes mellitus type 2 with neurological manifestations ([place])- Blood sugar is well controlled. ?-     Microalbumin / creatinine urine ratio; Future ?-     Basic metabolic panel; Future ?-     Hemoglobin A1c; Future ?-     Hemoglobin A1c ?-     Basic metabolic panel ?-      Microalbumin / creatinine urine ratio ? ?Hyperlipidemia with target LDL less than 100- LDL goal achieved. Doing well on the statin  ?-     Lipid panel; Future ?-     TSH; Future ?-     Hepatic function panel;

## 2021-07-05 ENCOUNTER — Other Ambulatory Visit: Payer: Self-pay | Admitting: *Deleted

## 2021-07-05 DIAGNOSIS — E1149 Type 2 diabetes mellitus with other diabetic neurological complication: Secondary | ICD-10-CM

## 2021-07-05 MED ORDER — METFORMIN HCL 500 MG PO TABS: ORAL_TABLET | ORAL | 0 refills | Status: DC

## 2021-07-05 NOTE — Telephone Encounter (Signed)
Message sent in provider absence. Patient is requesting a refill.  ? ?Please advise  ?

## 2021-07-19 ENCOUNTER — Telehealth: Payer: Self-pay | Admitting: Internal Medicine

## 2021-07-19 ENCOUNTER — Other Ambulatory Visit: Payer: Self-pay | Admitting: Internal Medicine

## 2021-07-19 DIAGNOSIS — I2510 Atherosclerotic heart disease of native coronary artery without angina pectoris: Secondary | ICD-10-CM

## 2021-07-19 DIAGNOSIS — E785 Hyperlipidemia, unspecified: Secondary | ICD-10-CM

## 2021-07-19 DIAGNOSIS — I10 Essential (primary) hypertension: Secondary | ICD-10-CM

## 2021-07-19 DIAGNOSIS — E1149 Type 2 diabetes mellitus with other diabetic neurological complication: Secondary | ICD-10-CM

## 2021-07-19 MED ORDER — METOPROLOL TARTRATE 25 MG PO TABS: 12.5000 mg | ORAL_TABLET | Freq: Two times a day (BID) | ORAL | 1 refills | Status: DC

## 2021-07-19 MED ORDER — ATORVASTATIN CALCIUM 40 MG PO TABS: 40.0000 mg | ORAL_TABLET | Freq: Every day | ORAL | 1 refills | Status: DC

## 2021-07-19 MED ORDER — METFORMIN HCL 500 MG PO TABS: ORAL_TABLET | ORAL | 1 refills | Status: DC

## 2021-07-19 NOTE — Telephone Encounter (Signed)
Patients daughter would like  his refills sent on Atorvastatin - metformin, metoprolol  - please send to [facility]

## 2021-08-09 ENCOUNTER — Ambulatory Visit: Payer: Medicare PPO | Admitting: Podiatry

## 2021-08-09 ENCOUNTER — Encounter: Payer: Self-pay | Admitting: Podiatry

## 2021-08-09 DIAGNOSIS — E1149 Type 2 diabetes mellitus with other diabetic neurological complication: Secondary | ICD-10-CM | POA: Diagnosis not present

## 2021-08-09 DIAGNOSIS — M79674 Pain in right toe(s): Secondary | ICD-10-CM

## 2021-08-09 DIAGNOSIS — B351 Tinea unguium: Secondary | ICD-10-CM | POA: Diagnosis not present

## 2021-08-09 DIAGNOSIS — M79675 Pain in left toe(s): Secondary | ICD-10-CM

## 2021-08-09 NOTE — Progress Notes (Signed)
This patient returns to my office for at risk foot care.  This patient requires this care by a professional since this patient will be at risk due to having diabetic neuropathy.   This patient is unable to cut nails himself since the patient cannot reach his nails.These nails are painful walking and wearing shoes.  He presents to the office with his daughter. This patient presents for at risk foot care today.  General Appearance  Alert, conversant and in no acute stress.  Vascular  Dorsalis pedis and posterior tibial  pulses are  weakly palpable  bilaterally.  Capillary return is within normal limits  bilaterally. Temperature is within normal limits  bilaterally.  Neurologic  Senn-Weinstein monofilament wire test within normal limits  bilaterally. Muscle power within normal limits bilaterally.  Nails Thick disfigured discolored nails with subungual debris  from hallux to fifth toes bilaterally. No evidence of bacterial infection or drainage bilaterally.  Orthopedic  No limitations of motion  feet .  No crepitus or effusions noted.  No bony pathology or digital deformities noted.  Skin  normotropic skin with no porokeratosis noted bilaterally.  No signs of infections or ulcers noted.     Onychomycosis  Pain in right toes  Pain in left toes  Consent was obtained for treatment procedures.   Mechanical debridement of nails 1-5  bilaterally performed with a nail nipper.  Filed with dremel without incident.    Return office visit    3 months                 Told patient to return for periodic foot care and evaluation due to potential at risk complications.   Gardiner Barefoot DPM

## 2021-08-19 ENCOUNTER — Other Ambulatory Visit: Payer: Self-pay | Admitting: Cardiology

## 2021-08-21 DIAGNOSIS — E119 Type 2 diabetes mellitus without complications: Secondary | ICD-10-CM | POA: Diagnosis not present

## 2021-09-02 ENCOUNTER — Telehealth: Payer: Self-pay | Admitting: Internal Medicine

## 2021-09-02 ENCOUNTER — Other Ambulatory Visit: Payer: Self-pay | Admitting: Internal Medicine

## 2021-09-02 DIAGNOSIS — K2100 Gastro-esophageal reflux disease with esophagitis, without bleeding: Secondary | ICD-10-CM

## 2021-09-02 MED ORDER — OMEPRAZOLE 40 MG PO CPDR: DELAYED_RELEASE_CAPSULE | ORAL | 1 refills | Status: DC

## 2021-09-02 NOTE — Telephone Encounter (Signed)
Caller & Relationship to patient: Manuel [name]  Call back number: [phone]  Date of last office visit: 06/20/21  Date of next office visit:   Medication(s) to be refilled:  omeprazole (PRILOSEC) 40 MG capsule       [id number] [address] [place] [address]  [phone] Fax:  [phone]

## 2021-10-11 ENCOUNTER — Telehealth: Payer: Self-pay | Admitting: Internal Medicine

## 2021-10-11 NOTE — Telephone Encounter (Signed)
Left message for patient to call back to schedule Medicare Annual Wellness Visit   Last AWV  10/09/20  Please schedule at anytime with LB Watkins if patient calls the office back.      Any questions, please call me at [phone]

## 2021-10-18 ENCOUNTER — Ambulatory Visit (INDEPENDENT_AMBULATORY_CARE_PROVIDER_SITE_OTHER): Payer: Medicare PPO

## 2021-10-18 DIAGNOSIS — Z0000 Encounter for general adult medical examination without abnormal findings: Secondary | ICD-10-CM

## 2021-10-18 NOTE — Patient Instructions (Signed)
Manuel [name] , Thank you for taking time to come for your Medicare Wellness Visit. I appreciate your ongoing commitment to your health goals. Please review the following plan we discussed and let me know if I can assist you in the future.   Screening recommendations/referrals: Colonoscopy: Discontinued; No longer recommended due to age. Recommended yearly ophthalmology/optometry visit for glaucoma screening and checkup Recommended yearly dental visit for hygiene and checkup  Vaccinations: Influenza vaccine: due Fall 2023 Pneumococcal vaccine: 07/18/2014, 01/31/2015 Tdap vaccine: 05/27/2011; due every 10 years (overdue) Shingles vaccine: due   Covid-19: 03/17/2019, 04/07/2019, 01/02/2020, 09/17/2020  Advanced directives: Yes; Please bring a copy of your health care power of attorney and living will to the office at your convenience.  Conditions/risks identified: Yes  Next appointment: Please schedule your next Medicare Wellness Visit with your Nurse Health Advisor in 1 year by calling [phone].  Preventive Care 33 Years and Older, Male Preventive care refers to lifestyle choices and visits with your health care provider that can promote health and wellness. What does preventive care include? A yearly physical exam. This is also called an annual well check. Dental exams once or twice a year. Routine eye exams. Ask your health care provider how often you should have your eyes checked. Personal lifestyle choices, including: Daily care of your teeth and gums. Regular physical activity. Eating a healthy diet. Avoiding tobacco and drug use. Limiting alcohol use. Practicing safe sex. Taking low doses of aspirin every day. Taking vitamin and mineral supplements as recommended by your health care provider. What happens during an annual well check? The services and screenings done by your health care provider during your annual well check will depend on your age, overall health, lifestyle risk  factors, and family history of disease. Counseling  Your health care provider may ask you questions about your: Alcohol use. Tobacco use. Drug use. Emotional well-being. Home and relationship well-being. Sexual activity. Eating habits. History of falls. Memory and ability to understand (cognition). Work and work Statistician. Screening  You may have the following tests or measurements: Height, weight, and BMI. Blood pressure. Lipid and cholesterol levels. These may be checked every 5 years, or more frequently if you are over 63 years old. Skin check. Lung cancer screening. You may have this screening every year starting at age 66 if you have a 30-pack-year history of smoking and currently smoke or have quit within the past 15 years. Fecal occult blood test (FOBT) of the stool. You may have this test every year starting at age 55. Flexible sigmoidoscopy or colonoscopy. You may have a sigmoidoscopy every 5 years or a colonoscopy every 10 years starting at age 34. Prostate cancer screening. Recommendations will vary depending on your family history and other risks. Hepatitis C blood test. Hepatitis B blood test. Sexually transmitted disease (STD) testing. Diabetes screening. This is done by checking your blood sugar (glucose) after you have not eaten for a while (fasting). You may have this done every 1-3 years. Abdominal aortic aneurysm (AAA) screening. You may need this if you are a current or former smoker. Osteoporosis. You may be screened starting at age 28 if you are at high risk. Talk with your health care provider about your test results, treatment options, and if necessary, the need for more tests. Vaccines  Your health care provider may recommend certain vaccines, such as: Influenza vaccine. This is recommended every year. Tetanus, diphtheria, and acellular pertussis (Tdap, Td) vaccine. You may need a Td booster every 10 years.  Zoster vaccine. You may need this after age  28. Pneumococcal 13-valent conjugate (PCV13) vaccine. One dose is recommended after age 23. Pneumococcal polysaccharide (PPSV23) vaccine. One dose is recommended after age 3. Talk to your health care provider about which screenings and vaccines you need and how often you need them. This information is not intended to replace advice given to you by your health care provider. Make sure you discuss any questions you have with your health care provider. Document Released: 03/09/2015 Document Revised: 10/31/2015 Document Reviewed: 12/12/2014 Elsevier Interactive Patient Education  2017 [place] Prevention in the Home Falls can cause injuries. They can happen to people of all ages. There are many things you can do to make your home safe and to help prevent falls. What can I do on the outside of my home? Regularly fix the edges of walkways and driveways and fix any cracks. Remove anything that might make you trip as you walk through a door, such as a raised step or threshold. Trim any bushes or trees on the path to your home. Use bright outdoor lighting. Clear any walking paths of anything that might make someone trip, such as rocks or tools. Regularly check to see if handrails are loose or broken. Make sure that both sides of any steps have handrails. Any raised decks and porches should have guardrails on the edges. Have any leaves, snow, or ice cleared regularly. Use sand or salt on walking paths during winter. Clean up any spills in your garage right away. This includes oil or grease spills. What can I do in the bathroom? Use night lights. Install grab bars by the toilet and in the tub and shower. Do not use towel bars as grab bars. Use non-skid mats or decals in the tub or shower. If you need to sit down in the shower, use a plastic, non-slip stool. Keep the floor dry. Clean up any water that spills on the floor as soon as it happens. Remove soap buildup in the tub or shower  regularly. Attach bath mats securely with double-sided non-slip rug tape. Do not have throw rugs and other things on the floor that can make you trip. What can I do in the bedroom? Use night lights. Make sure that you have a light by your bed that is easy to reach. Do not use any sheets or blankets that are too big for your bed. They should not hang down onto the floor. Have a firm chair that has side arms. You can use this for support while you get dressed. Do not have throw rugs and other things on the floor that can make you trip. What can I do in the kitchen? Clean up any spills right away. Avoid walking on wet floors. Keep items that you use a lot in easy-to-reach places. If you need to reach something above you, use a strong step stool that has a grab bar. Keep electrical cords out of the way. Do not use floor polish or wax that makes floors slippery. If you must use wax, use non-skid floor wax. Do not have throw rugs and other things on the floor that can make you trip. What can I do with my stairs? Do not leave any items on the stairs. Make sure that there are handrails on both sides of the stairs and use them. Fix handrails that are broken or loose. Make sure that handrails are as long as the stairways. Check any carpeting to make sure that  it is firmly attached to the stairs. Fix any carpet that is loose or worn. Avoid having throw rugs at the top or bottom of the stairs. If you do have throw rugs, attach them to the floor with carpet tape. Make sure that you have a light switch at the top of the stairs and the bottom of the stairs. If you do not have them, ask someone to add them for you. What else can I do to help prevent falls? Wear shoes that: Do not have high heels. Have rubber bottoms. Are comfortable and fit you well. Are closed at the toe. Do not wear sandals. If you use a stepladder: Make sure that it is fully opened. Do not climb a closed stepladder. Make sure that  both sides of the stepladder are locked into place. Ask someone to hold it for you, if possible. Clearly mark and make sure that you can see: Any grab bars or handrails. First and last steps. Where the edge of each step is. Use tools that help you move around (mobility aids) if they are needed. These include: Canes. Walkers. Scooters. Crutches. Turn on the lights when you go into a dark area. Replace any light bulbs as soon as they burn out. Set up your furniture so you have a clear path. Avoid moving your furniture around. If any of your floors are uneven, fix them. If there are any pets around you, be aware of where they are. Review your medicines with your doctor. Some medicines can make you feel dizzy. This can increase your chance of falling. Ask your doctor what other things that you can do to help prevent falls. This information is not intended to replace advice given to you by your health care provider. Make sure you discuss any questions you have with your health care provider. Document Released: 12/07/2008 Document Revised: 07/19/2015 Document Reviewed: 03/17/2014 Elsevier Interactive Patient Education  2017 [employer].

## 2021-10-18 NOTE — Progress Notes (Signed)
I connected with Manuel [name] today by telephone and verified that I am speaking with the correct person using two identifiers. Location patient: home Location provider: work Persons participating in the virtual visit: patient, provider.   I discussed the limitations, risks, security and privacy concerns of performing an evaluation and management service by telephone and the availability of in person appointments. I also discussed with the patient that there may be a patient responsible charge related to this service. The patient expressed understanding and verbally consented to this telephonic visit.    Interactive audio and video telecommunications were attempted between this provider and patient, however failed, due to patient having technical difficulties OR patient did not have access to video capability.  We continued and completed visit with audio only.  Some vital signs may be absent or patient reported.   Time Spent with patient on telephone encounter: 30 minutes  Subjective:   Manuel [name] is a 88 y.o. male who presents for Medicare Annual/Subsequent preventive examination.  Review of Systems     Cardiac Risk Factors include: advanced age (>54mn, >>76women);dyslipidemia;family history of premature cardiovascular disease;male gender;diabetes mellitus     Objective:    There were no vitals filed for this visit. There is no height or weight on file to calculate BMI.     10/18/2021    2:37 PM 10/09/2020    1:55 PM 05/06/2016    9:07 AM 02/04/2015   12:44 PM 09/06/2014    4:53 PM  Advanced Directives  Does Patient Have a Medical Advance Directive? Yes Yes Yes Yes Yes  Type of Advance Directive Living will;Healthcare Power of Attorney Living will;[place]care [place] of [place];Healthcare Power of Attorney   Does patient want to make changes to medical advance directive? No - Patient declined No - Patient declined  No - Patient  declined   Copy of [place] Chart? No - copy requested No - copy requested No - copy requested Yes Yes    Current Medications (verified) Outpatient Encounter Medications as of 10/18/2021  Medication Sig   aspirin EC 81 MG EC tablet Take 1 tablet (81 mg total) by mouth daily.   atorvastatin (LIPITOR) 40 MG tablet Take 1 tablet (40 mg total) by mouth daily.   Blood Glucose Monitoring Suppl (ONETOUCH VERIO FLEX SYSTEM) w/Device KIT 1 Device by Does not apply route daily. Use to check blood sugars daily Dx E11.9   glucose blood (ONETOUCH VERIO) test strip USE TO CHECK BLOOD SUGARS TWICE A DAY   irbesartan (AVAPRO) 150 MG tablet TAKE 1 TABLET BY MOUTH EVERY DAY   meclizine (ANTIVERT) 12.5 MG tablet Take one po qhs prn dizziness/vertigo   metFORMIN (GLUCOPHAGE) 500 MG tablet TAKE 2 TABLETS BY MOUTH TWICE A DAY WITH A MEAL   metoprolol tartrate (LOPRESSOR) 25 MG tablet Take 0.5 tablets (12.5 mg total) by mouth 2 (two) times daily.   Multiple Vitamin (MULTIVITAMIN) tablet Take 1 tablet by mouth daily.   mupirocin ointment (BACTROBAN) 2 % Apply 1 application topically 3 (three) times daily.   nitroGLYCERIN (NITROSTAT) 0.4 MG SL tablet Place 1 tablet (0.4 mg total) under the tongue every 5 (five) minutes as needed for chest pain.   omeprazole (PRILOSEC) 40 MG capsule TAKE 1 CAPSULE BY MOUTH EVERY DAY   ONETOUCH DELICA LANCETS 362XMISC Use to help check blood sugars twice a day Dx E11.9   No facility-administered encounter medications on file as of 10/18/2021.  Allergies (verified) Other   History: Past Medical History:  Diagnosis Date   Allergic rhinitis    childhood   Asthma    Allergic component   CAD (coronary artery disease)    s/p MI in 1/11. LHC (1/11) with 99% mCFX treated with 2.5 x 16 Xience V DES; 50% mLAD; EF 55%. ETT-myoview (10/12): 4'51", no significant ST segment changes, EF 56%, no ischemia or infarction.   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 No significant bradycardia.    GERD (gastroesophageal reflux disease)    Hx of cardiovascular stress test    ETT-Myoview (03/2013):  Inf defect on short axis images only (not felt to be significant), EF 68%; low risk.   Hyperlipidemia    Hypertension    MI (myocardial infarction) ([place]) 02/2009   Pneumonia    age 29   Past Surgical History:  Procedure Laterality Date   APPENDECTOMY     CORONARY STENT PLACEMENT     Family History  Problem Relation Age of Onset   Diabetes Mother    Heart failure Mother    Heart attack Father    Asthma Father    Cancer Neg Hx    Social History   Socioeconomic History   Marital status: Single    Spouse name: Not on file   Number of children: Not on file   Years of education: Not on file   Highest education level: Not on file  Occupational History   Occupation: retired  Tobacco Use   Smoking status: Former    Types: Cigarettes    Quit date: 06/09/1966    Years since quitting: 55.3   Smokeless tobacco: Never  Vaping Use   Vaping Use: Never used  Substance and Sexual Activity   Alcohol use: No    Alcohol/week: 0.0 standard drinks of alcohol   Drug use: No   Sexual activity: Not Currently  Other Topics Concern   Not on file  Social History Narrative   Associates degree.    Social Determinants of Health   Financial Resource Strain: Low Risk  (10/18/2021)   Overall Financial Resource Strain (CARDIA)    Difficulty of Paying Living Expenses: Not hard at all  Food Insecurity: No Food Insecurity (10/18/2021)   Hunger Vital Sign    Worried About Running Out of Food in the Last Year: Never true    Ran Out of Food in the Last Year: Never true  Transportation Needs: No Transportation Needs (10/18/2021)   PRAPARE - Hydrologist (Medical): No    Lack of Transportation (Non-Medical): No  Physical Activity:  Sufficiently Active (10/18/2021)   Exercise Vital Sign    Days of Exercise per Week: 5 days    Minutes of Exercise per Session: 60 min  Stress: No Stress Concern Present (10/18/2021)   [place]    Feeling of Stress : Not at all  Social Connections: [place] (10/18/2021)   Social Connection and Isolation Panel [NHANES]    Frequency of Communication with Friends and Family: More than three times a week    Frequency of Social Gatherings with Friends and Family: More than three times a week    Attends Religious Services: More than 4 times per year    Active Member of [employer]  or Organizations: Yes    Attends Music therapist: More than 4 times per year    Marital Status: Married    Tobacco Counseling Counseling given: Not Answered   Clinical Intake:  Pre-visit preparation completed: Yes  Pain : No/denies pain     Nutritional Risks: None Diabetes: Yes CBG done?: No Did pt. bring in CBG monitor from home?: No  How often do you need to have someone help you when you read instructions, pamphlets, or other written materials from your doctor or pharmacy?: 1 - Never What is the last grade level you completed in school?: HSG; Associates Degree  Diabetic? yes  Interpreter Needed?: No  Information entered by :: Lisette Abu, LPN.   Activities of Daily Living    10/18/2021    2:50 PM  In your present state of health, do you have any difficulty performing the following activities:  Hearing? 0  Vision? 0  Difficulty concentrating or making decisions? 0  Walking or climbing stairs? 0  Dressing or bathing? 0  Doing errands, shopping? 0  Preparing Food and eating ? N  Using the Toilet? N  In the past six months, have you accidently leaked urine? N  Do you have problems with loss of bowel control? N  Managing your Medications? N  Managing your Finances? N  Housekeeping or managing your  Housekeeping? N    Patient Care Team: [name], MD as PCP - General (Internal Medicine) Jerline Pain, MD as PCP - Cardiology (Cardiology) Marica Otter, OD as Consulting Physician (Optometry) Hayden Pedro, MD as Consulting Physician (Ophthalmology)  Indicate any recent Medical Services you may have received from other than [facility] providers in the past year (date may be approximate).     Assessment:   This is a routine wellness examination for Manuel [name].  Hearing/Vision screen Hearing Screening - Comments:: Patient denied any hearing difficulty.   No hearing aids.  Vision Screening - Comments:: Patient does wear corrective lenses/contacts.  Eye exam done by: Marica Otter, OD and Tempie Hoist, MD.   Dietary issues and exercise activities discussed: Current Exercise Habits: Home exercise routine, Type of exercise: walking, Time (Minutes): 60, Frequency (Times/Week): 5, Weekly Exercise (Minutes/Week): 300, Intensity: Moderate, Exercise limited by: respiratory conditions(s)   Goals Addressed             This Visit's Progress    To lose weight by staying physically active.        Depression Screen    10/18/2021    2:43 PM 06/20/2021    3:00 PM 10/09/2020    1:54 PM 03/22/2020    9:44 AM 10/11/2018    4:43 PM 02/09/2018    4:51 PM 02/09/2018    4:50 PM  PHQ 2/9 Scores  PHQ - 2 Score 0 0 0 0 0 0 0  PHQ- 9 Score  0         Fall Risk    10/18/2021    2:38 PM 06/20/2021    3:01 PM 10/09/2020    1:57 PM 03/22/2020    9:45 AM 10/11/2018    4:43 PM  [place] in the past year? 1 1 0 0 0  Number falls in past yr: 0 0 0  0  Injury with Fall? 0 0 0  0  Risk for fall due to : No Fall Risks  No Fall Risks  Impaired balance/gait  Follow up Falls evaluation completed  Falls evaluation completed  Falls  evaluation completed    FALL RISK PREVENTION PERTAINING TO THE HOME:  Any stairs in or around the home? No  If so, are there any without handrails? No  Home free of  loose throw rugs in walkways, pet beds, electrical cords, etc? Yes  Adequate lighting in your home to reduce risk of falls? Yes   ASSISTIVE DEVICES UTILIZED TO PREVENT FALLS:  Life alert? No  Use of a cane, walker or w/c? Yes  Grab bars in the bathroom? Yes  Shower chair or bench in shower? Yes  Elevated toilet seat or a handicapped toilet? Yes   TIMED UP AND GO:  Was the test performed? No .  Length of time to ambulate 10 feet: n/a sec.   Appearance of gait: Gait not evaluated during this visit.  Cognitive Function:    09/06/2014    4:56 PM  MMSE - Mini Mental State Exam  Not completed: Unable to complete        10/18/2021    2:50 PM  6CIT Screen  What Year? 0 points  What month? 0 points  What time? 0 points  Count back from 20 0 points  Months in reverse 0 points  Repeat phrase 0 points  Total Score 0 points    Immunizations Immunization History  Administered Date(s) Administered   Influenza Whole 12/31/2010, 12/24/2011   Influenza, High Dose Seasonal PF 12/01/2016, 12/06/2017   Influenza,inj,Quad PF,6+ Mos 01/31/2015   Influenza-Unspecified 12/27/2013, 11/29/2015, 12/19/2019   PFIZER(Purple Top)SARS-COV-2 Vaccination 03/17/2019, 04/07/2019, 01/02/2020, 09/17/2020   Pneumococcal Conjugate-13 11/15/2009, 09/21/2012, 07/18/2014   Pneumococcal Polysaccharide-23 01/31/2015   Tdap 05/27/2011   Zoster, Live 02/26/2015    TDAP status: Due, Education has been provided regarding the importance of this vaccine. Advised may receive this vaccine at local pharmacy or Health Dept. Aware to provide a copy of the vaccination record if obtained from local pharmacy or Health Dept. Verbalized acceptance and understanding.  Flu Vaccine status: Due, Education has been provided regarding the importance of this vaccine. Advised may receive this vaccine at local pharmacy or Health Dept. Aware to provide a copy of the vaccination record if obtained from local pharmacy or Health Dept.  Verbalized acceptance and understanding.  Pneumococcal vaccine status: Up to date  Covid-19 vaccine status: Completed vaccines  Qualifies for Shingles Vaccine? Yes   Zostavax completed Yes   Shingrix Completed?: No.    Education has been provided regarding the importance of this vaccine. Patient has been advised to call insurance company to determine out of pocket expense if they have not yet received this vaccine. Advised may also receive vaccine at local pharmacy or Health Dept. Verbalized acceptance and understanding.  Screening Tests Health Maintenance  Topic Date Due   Zoster Vaccines- Shingrix (1 of 2) Never done   COVID-19 Vaccine (5 - Pfizer risk series) 11/12/2020   TETANUS/TDAP  05/26/2021   INFLUENZA VACCINE  09/24/2021   FOOT EXAM  05/04/2022   OPHTHALMOLOGY EXAM  06/19/2022   Pneumonia Vaccine 59+ Years old  Completed   HPV VACCINES  Aged Out    Health Maintenance  Health Maintenance Due  Topic Date Due   Zoster Vaccines- Shingrix (1 of 2) Never done   COVID-19 Vaccine (5 - Pfizer risk series) 11/12/2020   TETANUS/TDAP  05/26/2021   INFLUENZA VACCINE  09/24/2021    Colorectal cancer screening: No longer required.   Lung Cancer Screening: (Low Dose CT Chest recommended if Age 66-80 years, 30 pack-year currently smoking OR have quit  w/in 15years.) does not qualify.   Lung Cancer Screening Referral: no  Additional Screening:  Hepatitis C Screening: does not qualify; Completed no  Vision Screening: Recommended annual ophthalmology exams for early detection of glaucoma and other disorders of the eye. Is the patient up to date with their annual eye exam?  Yes  Who is the provider or what is the name of the office in which the patient attends annual eye exams? Marica Otter, OD and Tempie Hoist, MD. If pt is not established with a provider, would they like to be referred to a provider to establish care? No .   Dental Screening: Recommended annual dental exams for  proper oral hygiene  Community Resource Referral / Chronic Care Management: CRR required this visit?  No   CCM required this visit?  No      Plan:     I have personally reviewed and noted the following in the patient's chart:   Medical and social history Use of alcohol, tobacco or illicit drugs  Current medications and supplements including opioid prescriptions. Patient is not currently taking opioid prescriptions. Functional ability and status Nutritional status Physical activity Advanced directives List of other physicians Hospitalizations, surgeries, and ER visits in previous 12 months Vitals Screenings to include cognitive, depression, and falls Referrals and appointments  In addition, I have reviewed and discussed with patient certain preventive protocols, quality metrics, and best practice recommendations. A written personalized care plan for preventive services as well as general preventive health recommendations were provided to patient.     Sheral Flow, LPN   9/67/2897   Nurse Notes:  Patient is cogitatively intact. There were no vitals filed for this visit. There is no height or weight on file to calculate BMI.

## 2021-11-05 ENCOUNTER — Encounter (INDEPENDENT_AMBULATORY_CARE_PROVIDER_SITE_OTHER): Payer: Medicare PPO | Admitting: Ophthalmology

## 2021-11-05 DIAGNOSIS — E113293 Type 2 diabetes mellitus with mild nonproliferative diabetic retinopathy without macular edema, bilateral: Secondary | ICD-10-CM | POA: Diagnosis not present

## 2021-11-05 DIAGNOSIS — H33302 Unspecified retinal break, left eye: Secondary | ICD-10-CM

## 2021-11-05 DIAGNOSIS — H43813 Vitreous degeneration, bilateral: Secondary | ICD-10-CM

## 2021-11-09 DIAGNOSIS — J189 Pneumonia, unspecified organism: Secondary | ICD-10-CM | POA: Diagnosis not present

## 2021-11-09 DIAGNOSIS — R42 Dizziness and giddiness: Secondary | ICD-10-CM | POA: Diagnosis not present

## 2021-11-09 DIAGNOSIS — E139 Other specified diabetes mellitus without complications: Secondary | ICD-10-CM | POA: Diagnosis not present

## 2021-11-14 ENCOUNTER — Encounter: Payer: Self-pay | Admitting: Internal Medicine

## 2021-11-14 ENCOUNTER — Ambulatory Visit: Payer: Medicare PPO | Admitting: Internal Medicine

## 2021-11-14 ENCOUNTER — Ambulatory Visit (INDEPENDENT_AMBULATORY_CARE_PROVIDER_SITE_OTHER): Payer: Medicare PPO

## 2021-11-14 VITALS — BP 126/82 | HR 65 | Temp 98.0°F | Resp 16 | Wt 179.0 lb

## 2021-11-14 DIAGNOSIS — I10 Essential (primary) hypertension: Secondary | ICD-10-CM | POA: Diagnosis not present

## 2021-11-14 DIAGNOSIS — R051 Acute cough: Secondary | ICD-10-CM | POA: Insufficient documentation

## 2021-11-14 DIAGNOSIS — Z23 Encounter for immunization: Secondary | ICD-10-CM | POA: Diagnosis not present

## 2021-11-14 DIAGNOSIS — J45909 Unspecified asthma, uncomplicated: Secondary | ICD-10-CM | POA: Diagnosis not present

## 2021-11-14 DIAGNOSIS — E785 Hyperlipidemia, unspecified: Secondary | ICD-10-CM | POA: Diagnosis not present

## 2021-11-14 DIAGNOSIS — J439 Emphysema, unspecified: Secondary | ICD-10-CM | POA: Diagnosis not present

## 2021-11-14 DIAGNOSIS — R059 Cough, unspecified: Secondary | ICD-10-CM | POA: Diagnosis not present

## 2021-11-14 MED ORDER — ATORVASTATIN CALCIUM 40 MG PO TABS: 40.0000 mg | ORAL_TABLET | Freq: Every day | ORAL | 1 refills | Status: DC

## 2021-11-14 NOTE — Progress Notes (Signed)
Subjective:  Patient ID: Manuel [name], male    DOB: [birth year]  Age: 88 y.o. MRN: [id number]  CC: Cough   HPI Manuel [name] presents for f/up -   About 4 days ago he developed cough, vertigo, and fatigue.  He is with his daughter today and she tells me he was seen at an urgent care center and was given an injection of an antibiotic and an inhaler.  She tells me he is not taking an oral antibiotic.  His symptoms have improved.  He has an improving nonproductive cough and fatigue but denies fever, chills, night sweats, chest pain, or hemoptysis.  Outpatient Medications Prior to Visit  Medication Sig Dispense Refill   aspirin EC 81 MG EC tablet Take 1 tablet (81 mg total) by mouth daily.     Blood Glucose Monitoring Suppl (ONETOUCH VERIO FLEX SYSTEM) w/Device KIT 1 Device by Does not apply route daily. Use to check blood sugars daily Dx E11.9 1 kit 0   glucose blood (ONETOUCH VERIO) test strip USE TO CHECK BLOOD SUGARS TWICE A DAY 100 each 3   irbesartan (AVAPRO) 150 MG tablet TAKE 1 TABLET BY MOUTH EVERY DAY 90 tablet 2   meclizine (ANTIVERT) 12.5 MG tablet Take one po qhs prn dizziness/vertigo 30 tablet 0   metFORMIN (GLUCOPHAGE) 500 MG tablet TAKE 2 TABLETS BY MOUTH TWICE A DAY WITH A MEAL 360 tablet 1   metoprolol tartrate (LOPRESSOR) 25 MG tablet Take 0.5 tablets (12.5 mg total) by mouth 2 (two) times daily. 90 tablet 1   Multiple Vitamin (MULTIVITAMIN) tablet Take 1 tablet by mouth daily.     mupirocin ointment (BACTROBAN) 2 % Apply 1 application topically 3 (three) times daily. 22 g 1   nitroGLYCERIN (NITROSTAT) 0.4 MG SL tablet Place 1 tablet (0.4 mg total) under the tongue every 5 (five) minutes as needed for chest pain. 25 tablet 3   omeprazole (PRILOSEC) 40 MG capsule TAKE 1 CAPSULE BY MOUTH EVERY DAY 90 capsule 1   ONETOUCH DELICA LANCETS 84X MISC Use to help check blood sugars twice a day Dx E11.9 100 each 3   atorvastatin (LIPITOR) 40 MG tablet Take 1 tablet (40 mg total) by mouth  daily. 90 tablet 1   No facility-administered medications prior to visit.    ROS Review of Systems  Constitutional:  Positive for fatigue. Negative for chills, diaphoresis and unexpected weight change.  HENT: Negative.    Eyes: Negative.   Respiratory:  Positive for cough. Negative for chest tightness, shortness of breath and wheezing.   Cardiovascular:  Negative for chest pain, palpitations and leg swelling.  Gastrointestinal:  Negative for abdominal pain, diarrhea, nausea and vomiting.  Endocrine: Negative.   Genitourinary: Negative.  Negative for difficulty urinating.  Musculoskeletal: Negative.   Skin: Negative.  Negative for color change.  Neurological:  Negative for dizziness and weakness.  Hematological:  Negative for adenopathy. Does not bruise/bleed easily.    Objective:  BP 126/82 (BP Location: Right Arm, Patient Position: Sitting, Cuff Size: Normal)   Pulse 65   Temp 98 F (36.7 C) (Oral)   Resp 16   Wt 179 lb (81.2 kg)   SpO2 98%   BMI 25.68 kg/m   BP Readings from Last 3 Encounters:  11/14/21 126/82  06/20/21 124/80  03/28/21 138/62    Wt Readings from Last 3 Encounters:  11/14/21 179 lb (81.2 kg)  06/20/21 182 lb 3.2 oz (82.6 kg)  03/28/21 178 lb (80.7 kg)  Physical Exam Vitals reviewed.  Constitutional:      General: He is not in acute distress.    Appearance: He is not ill-appearing, toxic-appearing or diaphoretic.  HENT:     Nose: Nose normal.     Mouth/Throat:     Mouth: Mucous membranes are moist.  Eyes:     General: No scleral icterus.    Conjunctiva/sclera: Conjunctivae normal.  Cardiovascular:     Rate and Rhythm: Normal rate and regular rhythm.     Heart sounds: No murmur heard. Pulmonary:     Effort: Pulmonary effort is normal.     Breath sounds: No stridor. No wheezing, rhonchi or rales.  Abdominal:     General: Abdomen is flat.     Palpations: There is no mass.     Tenderness: There is no abdominal tenderness. There is no  guarding.     Hernia: No hernia is present.  Musculoskeletal:        General: Normal range of motion.     Cervical back: Neck supple.     Right lower leg: No edema.     Left lower leg: No edema.  Lymphadenopathy:     Cervical: No cervical adenopathy.  Skin:    General: Skin is warm and dry.  Neurological:     General: No focal deficit present.     Mental Status: He is alert.  Psychiatric:        Mood and Affect: Mood normal.        Behavior: Behavior normal.     Lab Results  Component Value Date   WBC 4.1 03/22/2020   HGB 14.2 03/22/2020   HCT 42.5 03/22/2020   PLT 156.0 03/22/2020   GLUCOSE 119 (H) 06/20/2021   CHOL 155 06/20/2021   TRIG 100.0 06/20/2021   HDL 51.60 06/20/2021   LDLCALC 83 06/20/2021   ALT 25 06/20/2021   AST 27 06/20/2021   NA 137 06/20/2021   K 4.1 06/20/2021   CL 98 06/20/2021   CREATININE 0.97 06/20/2021   BUN 22 06/20/2021   CO2 33 (H) 06/20/2021   TSH 2.13 06/20/2021   INR 1.3 (H) 03/08/2019   HGBA1C 6.4 06/20/2021   MICROALBUR 2.4 (H) 06/20/2021    US Abdomen Complete  Result Date: 04/15/2018 CLINICAL DATA:  RIGHT upper quadrant abdominal pain. EXAM: ABDOMEN ULTRASOUND COMPLETE COMPARISON:  None. FINDINGS: Gallbladder: No gallstones or wall thickening visualized. No sonographic Murphy sign noted by sonographer. Common bile duct: Diameter: Normal at 4 mm Liver: Heterogeneous liver echotexture. No focal lesion. No duct dilatation. Portal vein is patent on color Doppler imaging with normal direction of blood flow towards the liver. IVC: No abnormality visualized. Pancreas: Visualized portion unremarkable. Spleen: Size and appearance within normal limits. Right Kidney: Length: 10.3 cm. 2 anechoic cysts in lower pole measuring 1.73.6 cm. Left Kidney: Length: 9.9 cm. Large anechoic cysts measuring 5 cm. Second smaller 1 mm anechoic cyst. Abdominal aorta: No aneurysm visualized. Other findings: None. IMPRESSION: 1. No evidence of cholecystitis.  No  acute findings. 2. Benign appearing renal cysts. Electronically Signed   By: Suzy Bouchard M.D.   On: 04/15/2018 11:50   DG Chest 2 View  Result Date: 11/14/2021 CLINICAL DATA:  Cough for 5 days, asthma, history of tobacco abuse EXAM: CHEST - 2 VIEW COMPARISON:  06/20/2021 FINDINGS: Frontal and lateral views of the chest demonstrate a stable cardiac silhouette. Chronic bibasilar areas of scarring. Hyperinflation consistent with underlying emphysema. Stable calcified left upper lobe granuloma. No  acute airspace disease, effusion, or pneumothorax. Chronic left pleural thickening and calcification. No acute bony abnormality. IMPRESSION: 1. Stable chest, no acute process. 2. Emphysema. Electronically Signed   By: Randa Ngo M.D.   On: 11/14/2021 14:57     Assessment & Plan:   Manuel [name] was seen today for cough.  Diagnoses and all orders for this visit:  Acute cough- His chest x-ray is negative for infiltrate. -     DG Chest 2 View; Future  Hyperlipidemia with target LDL less than 100- LDL goal achieved. Doing well on the statin  -     atorvastatin (LIPITOR) 40 MG tablet; Take 1 tablet (40 mg total) by mouth daily.  Flu vaccine need -     Flu Vaccine QUAD High Dose(Fluad)  Essential hypertension, benign- His blood pressure is well controlled.   I am having Manuel [name] maintain his aspirin EC, multivitamin, OneTouch Verio Flex System, OneTouch Delica Lancets 07E, glucose blood, nitroGLYCERIN, meclizine, mupirocin ointment, metoprolol tartrate, metFORMIN, irbesartan, omeprazole, and atorvastatin.  Meds ordered this encounter  Medications   atorvastatin (LIPITOR) 40 MG tablet    Sig: Take 1 tablet (40 mg total) by mouth daily.    Dispense:  90 tablet    Refill:  1     Follow-up: Return if symptoms worsen or fail to improve.  Scarlette Calico, MD

## 2021-11-15 ENCOUNTER — Encounter: Payer: Self-pay | Admitting: Internal Medicine

## 2021-11-15 ENCOUNTER — Ambulatory Visit: Payer: Medicare PPO | Admitting: Podiatry

## 2021-11-15 NOTE — Patient Instructions (Signed)

## 2021-11-18 ENCOUNTER — Encounter: Payer: Self-pay | Admitting: Podiatry

## 2021-11-18 ENCOUNTER — Ambulatory Visit: Payer: Medicare PPO | Admitting: Podiatry

## 2021-11-18 DIAGNOSIS — M79675 Pain in left toe(s): Secondary | ICD-10-CM | POA: Diagnosis not present

## 2021-11-18 DIAGNOSIS — M79674 Pain in right toe(s): Secondary | ICD-10-CM | POA: Diagnosis not present

## 2021-11-18 DIAGNOSIS — B351 Tinea unguium: Secondary | ICD-10-CM | POA: Diagnosis not present

## 2021-11-18 DIAGNOSIS — E1149 Type 2 diabetes mellitus with other diabetic neurological complication: Secondary | ICD-10-CM | POA: Diagnosis not present

## 2021-11-18 NOTE — Progress Notes (Signed)
This patient returns to my office for at risk foot care.  This patient requires this care by a professional since this patient will be at risk due to having diabetic neuropathy.   This patient is unable to cut nails himself since the patient cannot reach his nails.These nails are painful walking and wearing shoes.  He presents to the office with his daughter. This patient presents for at risk foot care today.  General Appearance  Alert, conversant and in no acute stress.  Vascular  Dorsalis pedis and posterior tibial  pulses are  weakly palpable  bilaterally.  Capillary return is within normal limits  bilaterally. Temperature is within normal limits  bilaterally.  Neurologic  Senn-Weinstein monofilament wire test within normal limits  bilaterally. Muscle power within normal limits bilaterally.  Nails Thick disfigured discolored nails with subungual debris  from hallux to fifth toes bilaterally. No evidence of bacterial infection or drainage bilaterally.  Orthopedic  No limitations of motion  feet .  No crepitus or effusions noted.  No bony pathology or digital deformities noted.  Skin  normotropic skin with no porokeratosis noted bilaterally.  No signs of infections or ulcers noted.     Onychomycosis  Pain in right toes  Pain in left toes  Consent was obtained for treatment procedures.   Mechanical debridement of nails 1-5  bilaterally performed with a nail nipper.  Filed with dremel without incident.    Return office visit    3 months                 Told patient to return for periodic foot care and evaluation due to potential at risk complications.   Gardiner Barefoot DPM

## 2021-12-24 DIAGNOSIS — L814 Other melanin hyperpigmentation: Secondary | ICD-10-CM | POA: Diagnosis not present

## 2021-12-24 DIAGNOSIS — L570 Actinic keratosis: Secondary | ICD-10-CM | POA: Diagnosis not present

## 2021-12-24 DIAGNOSIS — L821 Other seborrheic keratosis: Secondary | ICD-10-CM | POA: Diagnosis not present

## 2021-12-24 DIAGNOSIS — Z85828 Personal history of other malignant neoplasm of skin: Secondary | ICD-10-CM | POA: Diagnosis not present

## 2021-12-24 DIAGNOSIS — D225 Melanocytic nevi of trunk: Secondary | ICD-10-CM | POA: Diagnosis not present

## 2021-12-24 DIAGNOSIS — L578 Other skin changes due to chronic exposure to nonionizing radiation: Secondary | ICD-10-CM | POA: Diagnosis not present

## 2022-01-15 ENCOUNTER — Other Ambulatory Visit: Payer: Self-pay | Admitting: Internal Medicine

## 2022-01-15 DIAGNOSIS — I2510 Atherosclerotic heart disease of native coronary artery without angina pectoris: Secondary | ICD-10-CM

## 2022-01-15 DIAGNOSIS — I10 Essential (primary) hypertension: Secondary | ICD-10-CM

## 2022-01-17 ENCOUNTER — Other Ambulatory Visit: Payer: Self-pay | Admitting: Internal Medicine

## 2022-01-17 DIAGNOSIS — K2100 Gastro-esophageal reflux disease with esophagitis, without bleeding: Secondary | ICD-10-CM

## 2022-01-28 LAB — HM DIABETES EYE EXAM

## 2022-02-19 ENCOUNTER — Encounter: Payer: Self-pay | Admitting: Podiatry

## 2022-02-19 ENCOUNTER — Ambulatory Visit: Payer: Medicare PPO | Admitting: Podiatry

## 2022-02-19 DIAGNOSIS — B351 Tinea unguium: Secondary | ICD-10-CM

## 2022-02-19 DIAGNOSIS — M79674 Pain in right toe(s): Secondary | ICD-10-CM | POA: Diagnosis not present

## 2022-02-19 DIAGNOSIS — E1149 Type 2 diabetes mellitus with other diabetic neurological complication: Secondary | ICD-10-CM

## 2022-02-19 DIAGNOSIS — M79675 Pain in left toe(s): Secondary | ICD-10-CM

## 2022-02-19 NOTE — Progress Notes (Signed)
This patient returns to my office for at risk foot care.  This patient requires this care by a professional since this patient will be at risk due to having diabetic neuropathy.   This patient is unable to cut nails himself since the patient cannot reach his nails.These nails are painful walking and wearing shoes.  He presents to the office with his daughter. This patient presents for at risk foot care today.  General Appearance  Alert, conversant and in no acute stress.  Vascular  Dorsalis pedis and posterior tibial  pulses are  weakly palpable  bilaterally.  Capillary return is within normal limits  bilaterally. Temperature is within normal limits  bilaterally.  Neurologic  Senn-Weinstein monofilament wire test within normal limits  bilaterally. Muscle power within normal limits bilaterally.  Nails Thick disfigured discolored nails with subungual debris  from hallux to fifth toes bilaterally. No evidence of bacterial infection or drainage bilaterally.  Orthopedic  No limitations of motion  feet .  No crepitus or effusions noted.  No bony pathology or digital deformities noted.  Skin  normotropic skin with no porokeratosis noted bilaterally.  No signs of infections or ulcers noted.     Onychomycosis  Pain in right toes  Pain in left toes  Consent was obtained for treatment procedures.   Mechanical debridement of nails 1-5  bilaterally performed with a nail nipper.  Filed with dremel without incident.    Return office visit    3 months                 Told patient to return for periodic foot care and evaluation due to potential at risk complications.   Gardiner Barefoot DPM

## 2022-03-01 DIAGNOSIS — H10022 Other mucopurulent conjunctivitis, left eye: Secondary | ICD-10-CM | POA: Diagnosis not present

## 2022-03-01 DIAGNOSIS — Z6825 Body mass index (BMI) 25.0-25.9, adult: Secondary | ICD-10-CM | POA: Diagnosis not present

## 2022-03-01 DIAGNOSIS — I10 Essential (primary) hypertension: Secondary | ICD-10-CM | POA: Diagnosis not present

## 2022-03-01 DIAGNOSIS — Z23 Encounter for immunization: Secondary | ICD-10-CM | POA: Diagnosis not present

## 2022-05-10 ENCOUNTER — Other Ambulatory Visit: Payer: Self-pay | Admitting: Cardiology

## 2022-05-19 ENCOUNTER — Encounter: Payer: Self-pay | Admitting: Podiatry

## 2022-05-19 ENCOUNTER — Ambulatory Visit: Payer: Medicare PPO | Admitting: Podiatry

## 2022-05-19 DIAGNOSIS — M79674 Pain in right toe(s): Secondary | ICD-10-CM

## 2022-05-19 DIAGNOSIS — M79675 Pain in left toe(s): Secondary | ICD-10-CM | POA: Diagnosis not present

## 2022-05-19 DIAGNOSIS — B351 Tinea unguium: Secondary | ICD-10-CM

## 2022-05-19 DIAGNOSIS — E1149 Type 2 diabetes mellitus with other diabetic neurological complication: Secondary | ICD-10-CM

## 2022-05-19 NOTE — Progress Notes (Signed)
This patient returns to my office for at risk foot care.  This patient requires this care by a professional since this patient will be at risk due to having diabetic neuropathy.   This patient is unable to cut nails himself since the patient cannot reach his nails.These nails are painful walking and wearing shoes.  He presents to the office with his daughter. This patient presents for at risk foot care today.  General Appearance  Alert, conversant and in no acute stress.  Vascular  Dorsalis pedis and posterior tibial  pulses are  weakly palpable  bilaterally.  Capillary return is within normal limits  bilaterally. Temperature is within normal limits  bilaterally.  Neurologic  Senn-Weinstein monofilament wire test within normal limits  bilaterally. Muscle power within normal limits bilaterally.  Nails Thick disfigured discolored nails with subungual debris  from hallux to fifth toes bilaterally. No evidence of bacterial infection or drainage bilaterally.  Orthopedic  No limitations of motion  feet .  No crepitus or effusions noted.  No bony pathology or digital deformities noted.  Skin  normotropic skin with no porokeratosis noted bilaterally.  No signs of infections or ulcers noted.     Onychomycosis  Pain in right toes  Pain in left toes  Consent was obtained for treatment procedures.   Mechanical debridement of nails 1-5  bilaterally performed with a nail nipper.  Filed with dremel without incident.    Return office visit    3 months                 Told patient to return for periodic foot care and evaluation due to potential at risk complications.   Raea Magallon DPM   

## 2022-05-21 ENCOUNTER — Ambulatory Visit: Payer: Medicare PPO | Admitting: Podiatry

## 2022-06-13 ENCOUNTER — Other Ambulatory Visit (HOSPITAL_COMMUNITY): Payer: Self-pay

## 2022-06-13 ENCOUNTER — Other Ambulatory Visit: Payer: Self-pay

## 2022-06-13 ENCOUNTER — Encounter (HOSPITAL_COMMUNITY): Payer: Self-pay

## 2022-06-13 ENCOUNTER — Other Ambulatory Visit: Payer: Self-pay | Admitting: Internal Medicine

## 2022-06-13 ENCOUNTER — Ambulatory Visit: Payer: Medicare PPO | Attending: Cardiology | Admitting: Cardiology

## 2022-06-13 ENCOUNTER — Encounter: Payer: Self-pay | Admitting: Cardiology

## 2022-06-13 DIAGNOSIS — I10 Essential (primary) hypertension: Secondary | ICD-10-CM | POA: Diagnosis not present

## 2022-06-13 DIAGNOSIS — I2510 Atherosclerotic heart disease of native coronary artery without angina pectoris: Secondary | ICD-10-CM | POA: Diagnosis not present

## 2022-06-13 DIAGNOSIS — E1149 Type 2 diabetes mellitus with other diabetic neurological complication: Secondary | ICD-10-CM

## 2022-06-13 DIAGNOSIS — E785 Hyperlipidemia, unspecified: Secondary | ICD-10-CM

## 2022-06-13 MED ORDER — IRBESARTAN 150 MG PO TABS
150.0000 mg | ORAL_TABLET | Freq: Every day | ORAL | 3 refills | Status: DC
  Filled 2022-06-13: qty 90, 90d supply, fill #0

## 2022-06-13 MED ORDER — NITROGLYCERIN 0.4 MG SL SUBL
0.4000 mg | SUBLINGUAL_TABLET | SUBLINGUAL | 3 refills | Status: DC | PRN
  Filled 2022-06-13: qty 25, 7d supply, fill #0

## 2022-06-13 MED ORDER — ATORVASTATIN CALCIUM 40 MG PO TABS
40.0000 mg | ORAL_TABLET | Freq: Every day | ORAL | 3 refills | Status: DC
  Filled 2022-06-13: qty 90, 90d supply, fill #0

## 2022-06-13 MED ORDER — METOPROLOL TARTRATE 25 MG PO TABS
12.5000 mg | ORAL_TABLET | Freq: Two times a day (BID) | ORAL | 3 refills | Status: DC
  Filled 2022-06-13: qty 90, 90d supply, fill #0

## 2022-06-13 NOTE — Patient Instructions (Signed)
Medication Instructions:  The current medical regimen is effective;  continue present plan and medications.  *If you need a refill on your cardiac medications before your next appointment, please call your pharmacy*  Follow-Up: At [facility], you and your health needs are our priority.  As part of our continuing mission to provide you with exceptional heart care, we have created designated Provider Care Teams.  These Care Teams include your primary Cardiologist (physician) and Advanced Practice Providers (APPs -  Physician Assistants and Nurse Practitioners) who all work together to provide you with the care you need, when you need it.  We recommend signing up for the patient portal called "MyChart".  Sign up information is provided on this After Visit Summary.  MyChart is used to connect with patients for Virtual Visits (Telemedicine).  Patients are able to view lab/test results, encounter notes, upcoming appointments, etc.  Non-urgent messages can be sent to your provider as well.   To learn more about what you can do with MyChart, go to [email or web address].    Your next appointment:   1 year(s)  Provider:   Mark Skains, MD      

## 2022-06-13 NOTE — Progress Notes (Signed)
Cardiology Office Note:    Date:  06/13/2022   ID:  Manuel [name], DOB [birth year], MRN [id number]  PCP:  Etta Grandchild, MD   [facility] Providers Cardiologist:  Donato Schultz, MD     Referring MD: Etta Grandchild, MD    History of Present Illness:    Manuel [name] is a 89 y.o. male here for the follow-up of coronary artery disease status postmyocardial infarction in January 2011 with diabetes hypertension hyperlipidemia.  Has had a longstanding history of vertiginous-like symptoms.  Previously at 1 point he felt like getting out of bed he was going in circles.  Had been given Epley's maneuver from physical therapy.  Orthostatics previously done have been normal.  Rarely has these dizzy spells anymore.  Enjoys walking [place] in the past [address] walks 5 days a week three quarters of a mile.  Also enjoys golfing.  He slowed down a little bit around Thanksgiving when he was having more vertigo-like symptoms.  Overall he is feeling much clearer now.  Here with his daughter for historical supplementation.  We are transitioning his pharmacy to [facility] outpatient pharmacy.  1. Asthma: Allergic component 2. CAD: s/p MI in 1/11.  LHC (1/11) with 99% mCFX treated with 2.5 x 16 Xience V DES; 50% mLAD; EF 55%.  ETT-myoview (10/12): 4'51", no significant ST segment changes, EF 56%, no ischemia or infarction. ETT-Cardiolite (2/15): 4'15", probably normal with no ischemia/infarction, EF 68%.  3. GERD 4. Appendectomy 5. HTN 6. Hyperlipidemia 7. Diabetes mellitus type II 8. Lightheaded spells: Holter (10/12): Frequent PACs and PVCs.  No significant bradycardia.  Probably due primarily to BPPV.      FH: Parents lived into their 43s.  No premature CAD.     SH: Moved from [place], [place] to [place].  Married, originally from [place].  Now living in daughter's house.  Quit smoking 1990, retired Medical illustrator, occasional smoker.      Prior patient of Dr. Alford Highland  Past Medical  History:  Diagnosis Date   Allergic rhinitis    childhood   Asthma    Allergic component   CAD (coronary artery disease)    s/p MI in 1/11. LHC (1/11) with 99% mCFX treated with 2.5 x 16 Xience V DES; 50% mLAD; EF 55%. ETT-myoview (10/12): 4'51", no significant ST segment changes, EF 56%, no ischemia or infarction.   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 No significant bradycardia.    GERD (gastroesophageal reflux disease)    Hx of cardiovascular stress test    ETT-Myoview (03/2013):  Inf defect on short axis images only (not felt to be significant), EF 68%; low risk.   Hyperlipidemia    Hypertension    MI (myocardial infarction) 02/2009   Pneumonia    age 21    Past Surgical History:  Procedure Laterality Date   APPENDECTOMY     CORONARY STENT PLACEMENT      Current Medications: Current Meds  Medication Sig   aspirin EC 81 MG EC tablet Take 1 tablet (81 mg total) by mouth daily.   Blood Glucose Monitoring Suppl (ONETOUCH VERIO FLEX SYSTEM) w/Device KIT 1 Device by Does not apply route daily. Use to check blood sugars daily Dx E11.9   glucose blood (ONETOUCH VERIO) test strip USE TO CHECK BLOOD SUGARS TWICE A  DAY   meclizine (ANTIVERT) 12.5 MG tablet Take one po qhs prn dizziness/vertigo   metFORMIN (GLUCOPHAGE) 500 MG tablet TAKE 2 TABLETS BY MOUTH TWICE A DAY WITH A MEAL   Multiple Vitamin (MULTIVITAMIN) tablet Take 1 tablet by mouth daily.   mupirocin ointment (BACTROBAN) 2 % Apply 1 application topically 3 (three) times daily.   omeprazole (PRILOSEC) 40 MG capsule TAKE 1 CAPSULE BY MOUTH EVERY DAY   ONETOUCH DELICA LANCETS 33G MISC Use to help check blood sugars twice a day Dx E11.9   [DISCONTINUED] atorvastatin (LIPITOR) 40 MG tablet Take 1 tablet (40 mg total) by mouth daily.   [DISCONTINUED] irbesartan (AVAPRO) 150 MG tablet TAKE 1 TABLET BY MOUTH EVERY DAY    [DISCONTINUED] metoprolol tartrate (LOPRESSOR) 25 MG tablet TAKE 0.5 TABLETS BY MOUTH 2 TIMES DAILY.   [DISCONTINUED] nitroGLYCERIN (NITROSTAT) 0.4 MG SL tablet Place 1 tablet (0.4 mg total) under the tongue every 5 (five) minutes as needed for chest pain.     Allergies:   Other   Social History   Socioeconomic History   Marital status: Single    Spouse name: Not on file   Number of children: Not on file   Years of education: Not on file   Highest education level: Not on file  Occupational History   Occupation: retired  Tobacco Use   Smoking status: Former    Types: Cigarettes    Quit date: 06/09/1966    Years since quitting: 56.0   Smokeless tobacco: Never  Vaping Use   Vaping Use: Never used  Substance and Sexual Activity   Alcohol use: No    Alcohol/week: 0.0 standard drinks of alcohol   Drug use: No   Sexual activity: Not Currently  Other Topics Concern   Not on file  Social History Narrative   Associates degree.    Social Determinants of Health   Financial Resource Strain: Low Risk  (10/18/2021)   Overall Financial Resource Strain (CARDIA)    Difficulty of Paying Living Expenses: Not hard at all  Food Insecurity: No Food Insecurity (10/18/2021)   Hunger Vital Sign    Worried About Running Out of Food in the Last Year: Never true    Ran Out of Food in the Last Year: Never true  Transportation Needs: No Transportation Needs (10/18/2021)   PRAPARE - Administrator, Civil Service (Medical): No    Lack of Transportation (Non-Medical): No  Physical Activity: Sufficiently Active (10/18/2021)   Exercise Vital Sign    Days of Exercise per Week: 5 days    Minutes of Exercise per Session: 60 min  Stress: No Stress Concern Present (10/18/2021)   [employer] of Occupational Health - Occupational Stress Questionnaire    Feeling of Stress : Not at all  Social Connections: Socially Integrated (10/18/2021)   Social Connection and Isolation Panel [NHANES]     Frequency of Communication with Friends and Family: More than three times a week    Frequency of Social Gatherings with Friends and Family: More than three times a week    Attends Religious Services: More than 4 times per year    Active Member of [employer] or Organizations: Yes    Attends Engineer, structural: More than 4 times per year    Marital Status: Married     Family History: The patient's family history includes Asthma in his father; Diabetes in his mother; Heart attack in his father; Heart failure in his mother. There is no history  of Cancer.  ROS:   Please see the history of present illness.    No fevers chills nausea vomiting syncope all other systems reviewed and are negative.  EKGs/Labs/Other Studies Reviewed:    The following studies were reviewed today: Nuclear stress test 2015-low risk no significant ischemia identified.  EKG:   06/13/2022-normal sinus rhythm 67 left axis deviation nonspecific T wave abnormality. Prior sinus rhythm first-degree AV block 214 ms with heart rate of 66 bpm  Recent Labs: 06/20/2021: ALT 25; BUN 22; Creatinine, Ser 0.97; Potassium 4.1; Sodium 137; TSH 2.13  Recent Lipid Panel    Component Value Date/Time   CHOL 155 06/20/2021 1530   TRIG 100.0 06/20/2021 1530   HDL 51.60 06/20/2021 1530   CHOLHDL 3 06/20/2021 1530   VLDL 20.0 06/20/2021 1530   LDLCALC 83 06/20/2021 1530     Risk Assessment/Calculations:              Physical Exam:    VS:  BP (!) 144/73 (BP Location: Left Arm, Patient Position: Sitting, Cuff Size: Normal)   Pulse 67   Ht 5\' 10"  (1.778 m)   Wt 174 lb (78.9 kg)   BMI 24.97 kg/m     Wt Readings from Last 3 Encounters:  06/13/22 174 lb (78.9 kg)  11/14/21 179 lb (81.2 kg)  06/20/21 182 lb 3.2 oz (82.6 kg)     GEN:  Well nourished, well developed in no acute distress HEENT: Normal NECK: No JVD; No carotid bruits LYMPHATICS: No lymphadenopathy CARDIAC: RRR, no murmurs, no rubs,  gallops RESPIRATORY:  Clear to auscultation without rales, wheezing or rhonchi  ABDOMEN: Soft, non-tender, non-distended MUSCULOSKELETAL:  No edema; No deformity  SKIN: Warm and dry NEUROLOGIC:  Alert and oriented x 3 PSYCHIATRIC:  Normal affect   ASSESSMENT:    1. Atherosclerosis of native coronary artery of native heart without angina pectoris   2. Essential hypertension, benign   3. Hyperlipidemia with target LDL less than 100     PLAN:    In order of problems listed above:   Coronary atherosclerosis Prior circumflex stent, DES in 2011 in the setting of MI.  Stable no anginal symptoms.  Previous stress test was reassuring.  His daughter used to run the phase 3 cardiac rehab program at [facility].  Still doing very well.  No issues.  Does a good job. NTG only 3 times since 2011.   Hyperlipidemia with target LDL less than 100 On atorvastatin 40 mg a day high intensity statin.  Doing well without any myalgias.  Previously check LDL 83.  Goal is less than 70.  Continuing to be compliant with medication.  Essential hypertension, benign At 1 point blood pressures were quite low 103/69 for instance.  We had previously discontinued his HCTZ 12.5 and decrease his irbesartan 150.  Seems to be quite stable on this regimen.  He is also on metoprolol 25 mg.  No changes made today.  Nonalcoholic steatohepatitis (NASH) Chronically elevated ALT and AST.  Interestingly, his daughter has had the same type of profile.  No changes made.  They are not at the 3 times upper limit of normal.  No changes             Medication Adjustments/Labs and Tests Ordered: Current medicines are reviewed at length with the patient today.  Concerns regarding medicines are outlined above.  Orders Placed This Encounter  Procedures   EKG 12-Lead   Meds ordered this encounter  Medications  irbesartan (AVAPRO) 150 MG tablet    Sig: Take 1 tablet (150 mg total) by mouth daily.    Dispense:  90  tablet    Refill:  3    Pt requests delivery services   nitroGLYCERIN (NITROSTAT) 0.4 MG SL tablet    Sig: Place 1 tablet (0.4 mg total) under the tongue every 5 (five) minutes as needed for chest pain.    Dispense:  25 tablet    Refill:  3    Pt requests delivery services   metoprolol tartrate (LOPRESSOR) 25 MG tablet    Sig: Take 0.5 tablets (12.5 mg total) by mouth 2 (two) times daily.    Dispense:  90 tablet    Refill:  3    Pt requests delivery services   atorvastatin (LIPITOR) 40 MG tablet    Sig: Take 1 tablet (40 mg total) by mouth daily.    Dispense:  90 tablet    Refill:  3    Pt requests delivery services    Patient Instructions  Medication Instructions:  The current medical regimen is effective;  continue present plan and medications.  *If you need a refill on your cardiac medications before your next appointment, please call your pharmacy*  Follow-Up: At [facility], you and your health needs are our priority.  As part of our continuing mission to provide you with exceptional heart care, we have created designated Provider Care Teams.  These Care Teams include your primary Cardiologist (physician) and Advanced Practice Providers (APPs -  Physician Assistants and Nurse Practitioners) who all work together to provide you with the care you need, when you need it.  We recommend signing up for the patient portal called "MyChart".  Sign up information is provided on this After Visit Summary.  MyChart is used to connect with patients for Virtual Visits (Telemedicine).  Patients are able to view lab/test results, encounter notes, upcoming appointments, etc.  Non-urgent messages can be sent to your provider as well.   To learn more about what you can do with MyChart, go to [email or web address].    Your next appointment:   1 year(s)  Provider:   Donato Schultz, MD        Signed, Donato Schultz, MD  06/13/2022 9:53 AM    [facility]

## 2022-07-07 ENCOUNTER — Other Ambulatory Visit: Payer: Self-pay | Admitting: Internal Medicine

## 2022-07-07 DIAGNOSIS — I10 Essential (primary) hypertension: Secondary | ICD-10-CM

## 2022-07-07 DIAGNOSIS — K2100 Gastro-esophageal reflux disease with esophagitis, without bleeding: Secondary | ICD-10-CM

## 2022-07-07 DIAGNOSIS — E785 Hyperlipidemia, unspecified: Secondary | ICD-10-CM

## 2022-07-07 DIAGNOSIS — I2510 Atherosclerotic heart disease of native coronary artery without angina pectoris: Secondary | ICD-10-CM

## 2022-07-16 ENCOUNTER — Telehealth: Payer: Self-pay | Admitting: Internal Medicine

## 2022-07-16 NOTE — Telephone Encounter (Signed)
Patient's daughter Marisue Ivan called and would like to change the pharmacy for only metFORMIN (GLUCOPHAGE) 500 MG tablet to the [facility] for mail order. The medication is not due for refill yet, but she wanted to change it for the next refill for only the above medication. Best callback for Marisue Ivan is [phone].

## 2022-07-16 NOTE — Telephone Encounter (Signed)
LVM for pt to make appt for medication refill

## 2022-07-16 NOTE — Telephone Encounter (Signed)
He is overdue for an office visit with labs

## 2022-07-18 NOTE — Telephone Encounter (Signed)
Appt made for med refill 08/18/22

## 2022-07-24 ENCOUNTER — Other Ambulatory Visit (HOSPITAL_COMMUNITY): Payer: Self-pay

## 2022-08-18 ENCOUNTER — Encounter (HOSPITAL_COMMUNITY): Payer: Self-pay

## 2022-08-18 ENCOUNTER — Ambulatory Visit: Payer: Medicare PPO | Admitting: Internal Medicine

## 2022-08-18 ENCOUNTER — Other Ambulatory Visit (HOSPITAL_COMMUNITY): Payer: Self-pay

## 2022-08-18 ENCOUNTER — Ambulatory Visit: Payer: Medicare PPO | Admitting: Podiatry

## 2022-08-18 ENCOUNTER — Encounter: Payer: Self-pay | Admitting: Podiatry

## 2022-08-18 ENCOUNTER — Encounter: Payer: Self-pay | Admitting: Internal Medicine

## 2022-08-18 VITALS — BP 144/86 | HR 67 | Temp 97.9°F | Ht 70.0 in | Wt 175.0 lb

## 2022-08-18 DIAGNOSIS — I10 Essential (primary) hypertension: Secondary | ICD-10-CM

## 2022-08-18 DIAGNOSIS — E1149 Type 2 diabetes mellitus with other diabetic neurological complication: Secondary | ICD-10-CM

## 2022-08-18 DIAGNOSIS — E785 Hyperlipidemia, unspecified: Secondary | ICD-10-CM | POA: Diagnosis not present

## 2022-08-18 DIAGNOSIS — Z7984 Long term (current) use of oral hypoglycemic drugs: Secondary | ICD-10-CM

## 2022-08-18 DIAGNOSIS — I2510 Atherosclerotic heart disease of native coronary artery without angina pectoris: Secondary | ICD-10-CM

## 2022-08-18 DIAGNOSIS — B351 Tinea unguium: Secondary | ICD-10-CM | POA: Diagnosis not present

## 2022-08-18 DIAGNOSIS — M79675 Pain in left toe(s): Secondary | ICD-10-CM

## 2022-08-18 DIAGNOSIS — K2100 Gastro-esophageal reflux disease with esophagitis, without bleeding: Secondary | ICD-10-CM | POA: Diagnosis not present

## 2022-08-18 DIAGNOSIS — M79674 Pain in right toe(s): Secondary | ICD-10-CM | POA: Diagnosis not present

## 2022-08-18 MED ORDER — ONETOUCH DELICA LANCETS 33G MISC
3 refills | Status: DC
Start: 2022-08-18 — End: 2023-06-16
  Filled 2022-08-18: qty 100, 50d supply, fill #0

## 2022-08-18 MED ORDER — METFORMIN HCL 500 MG PO TABS
1000.0000 mg | ORAL_TABLET | Freq: Two times a day (BID) | ORAL | 1 refills | Status: DC
Start: 2022-08-18 — End: 2023-06-16
  Filled 2022-08-18 – 2022-11-17 (×2): qty 360, 90d supply, fill #0
  Filled 2023-01-05: qty 120, 30d supply, fill #0
  Filled 2023-02-11: qty 360, 90d supply, fill #0
  Filled 2023-05-20: qty 360, 90d supply, fill #1

## 2022-08-18 MED ORDER — ATORVASTATIN CALCIUM 40 MG PO TABS
40.0000 mg | ORAL_TABLET | Freq: Every day | ORAL | 1 refills | Status: DC
Start: 2022-08-18 — End: 2023-01-05
  Filled 2022-08-18: qty 90, 90d supply, fill #0
  Filled 2022-11-17: qty 90, 90d supply, fill #1

## 2022-08-18 MED ORDER — OMEPRAZOLE 40 MG PO CPDR
40.0000 mg | DELAYED_RELEASE_CAPSULE | Freq: Every day | ORAL | 1 refills | Status: DC
Start: 2022-08-18 — End: 2023-01-05
  Filled 2022-08-18: qty 90, 90d supply, fill #0
  Filled 2022-11-17: qty 90, 90d supply, fill #1

## 2022-08-18 MED ORDER — METFORMIN HCL 500 MG PO TABS
ORAL_TABLET | ORAL | 1 refills | Status: DC
Start: 2022-08-18 — End: 2022-08-18
  Filled 2022-08-18: qty 360, fill #0

## 2022-08-18 MED ORDER — ONETOUCH VERIO VI STRP
ORAL_STRIP | 3 refills | Status: DC
Start: 2022-08-18 — End: 2023-06-16
  Filled 2022-08-18: qty 100, 50d supply, fill #0

## 2022-08-18 MED ORDER — METOPROLOL TARTRATE 25 MG PO TABS
12.5000 mg | ORAL_TABLET | Freq: Two times a day (BID) | ORAL | 1 refills | Status: DC
Start: 2022-08-18 — End: 2023-01-05
  Filled 2022-08-18: qty 90, 90d supply, fill #0
  Filled 2022-11-17: qty 90, 90d supply, fill #1

## 2022-08-18 NOTE — Progress Notes (Signed)
This patient returns to my office for at risk foot care.  This patient requires this care by a professional since this patient will be at risk due to having diabetic neuropathy.   This patient is unable to cut nails himself since the patient cannot reach his nails.These nails are painful walking and wearing shoes.  He presents to the office with his daughter. This patient presents for at risk foot care today.  General Appearance  Alert, conversant and in no acute stress.  Vascular  Dorsalis pedis and posterior tibial  pulses are  weakly palpable  bilaterally.  Capillary return is within normal limits  bilaterally. Temperature is within normal limits  bilaterally.  Neurologic  Senn-Weinstein monofilament wire test within normal limits  bilaterally. Muscle power within normal limits bilaterally.  Nails Thick disfigured discolored nails with subungual debris  from hallux to fifth toes bilaterally. No evidence of bacterial infection or drainage bilaterally.  Orthopedic  No limitations of motion  feet .  No crepitus or effusions noted.  No bony pathology or digital deformities noted.  Skin  normotropic skin with no porokeratosis noted bilaterally.  No signs of infections or ulcers noted.     Onychomycosis  Pain in right toes  Pain in left toes  Consent was obtained for treatment procedures.   Mechanical debridement of nails 1-5  bilaterally performed with a nail nipper.  Filed with dremel without incident.    Return office visit    3 months                 Told patient to return for periodic foot care and evaluation due to potential at risk complications.   Taima Rada DPM   

## 2022-08-18 NOTE — Progress Notes (Signed)
Subjective:  Patient ID: Manuel [name], male    DOB: 1928-04-25  Age: 89 y.o. MRN: [id number]  CC: Hypertension, Diabetes, and Hyperlipidemia   HPI Manuel [name] presents for f/up  ---  He walks one mile/day. He has good endurance and denies DOE, CP, SOB, edema.       Outpatient Medications Prior to Visit  Medication Sig Dispense Refill   aspirin EC 81 MG EC tablet Take 1 tablet (81 mg total) by mouth daily.     Blood Glucose Monitoring Suppl (ONETOUCH VERIO FLEX SYSTEM) w/Device KIT 1 Device by Does not apply route daily. Use to check blood sugars daily Dx E11.9 1 kit 0   irbesartan (AVAPRO) 150 MG tablet Take 1 tablet (150 mg total) by mouth daily. 90 tablet 3   Multiple Vitamin (MULTIVITAMIN) tablet Take 1 tablet by mouth daily.     nitroGLYCERIN (NITROSTAT) 0.4 MG SL tablet Place 1 tablet (0.4 mg total) under the tongue every 5 (five) minutes as needed for chest pain. 25 tablet 3   atorvastatin (LIPITOR) 40 MG tablet Take 1 tablet (40 mg total) by mouth daily. 90 tablet 3   glucose blood (ONETOUCH VERIO) test strip USE TO CHECK BLOOD SUGARS TWICE A DAY 100 each 3   meclizine (ANTIVERT) 12.5 MG tablet Take one po qhs prn dizziness/vertigo 30 tablet 0   metFORMIN (GLUCOPHAGE) 500 MG tablet TAKE 2 TABLETS BY MOUTH TWICE A DAY WITH A MEAL 360 tablet 1   metoprolol tartrate (LOPRESSOR) 25 MG tablet Take 0.5 tablets (12.5 mg total) by mouth 2 (two) times daily. 90 tablet 3   mupirocin ointment (BACTROBAN) 2 % Apply 1 application topically 3 (three) times daily. 22 g 1   omeprazole (PRILOSEC) 40 MG capsule TAKE 1 CAPSULE BY MOUTH EVERY DAY 90 capsule 1   ONETOUCH DELICA LANCETS 33G MISC Use to help check blood sugars twice a day Dx E11.9 100 each 3   No facility-administered medications prior to visit.    ROS Review of Systems  Constitutional: Negative.  Negative for diaphoresis and fatigue.  HENT: Negative.    Eyes: Negative.   Respiratory:  Negative for cough, chest tightness,  shortness of breath and wheezing.   Cardiovascular:  Negative for chest pain, palpitations and leg swelling.  Gastrointestinal:  Negative for abdominal pain, constipation, diarrhea, nausea and vomiting.  Endocrine: Negative.   Genitourinary: Negative.  Negative for difficulty urinating.  Musculoskeletal:  Negative for arthralgias and myalgias.  Skin: Negative.   Neurological:  Negative for dizziness, weakness, light-headedness and headaches.  Hematological:  Negative for adenopathy. Does not bruise/bleed easily.  Psychiatric/Behavioral: Negative.      Objective:  BP (!) 144/86 (BP Location: Right Arm, Patient Position: Sitting, Cuff Size: Large)   Pulse 67   Temp 97.9 F (36.6 C) (Oral)   Ht 5\' 10"  (1.778 m)   Wt 175 lb (79.4 kg)   SpO2 90%   BMI 25.11 kg/m   BP Readings from Last 3 Encounters:  08/18/22 (!) 144/86  06/13/22 (!) 144/73  11/14/21 126/82    Wt Readings from Last 3 Encounters:  08/18/22 175 lb (79.4 kg)  06/13/22 174 lb (78.9 kg)  11/14/21 179 lb (81.2 kg)    Physical Exam Vitals reviewed.  Constitutional:      Appearance: Normal appearance.  HENT:     Mouth/Throat:     Mouth: Mucous membranes are moist.  Eyes:     General: No scleral icterus.  Conjunctiva/sclera: Conjunctivae normal.  Cardiovascular:     Rate and Rhythm: Normal rate and regular rhythm.     Heart sounds: No murmur heard. Pulmonary:     Effort: Pulmonary effort is normal.     Breath sounds: No stridor. No wheezing, rhonchi or rales.  Abdominal:     General: Abdomen is flat.     Palpations: There is no mass.     Tenderness: There is no abdominal tenderness. There is no guarding.     Hernia: No hernia is present.  Musculoskeletal:        General: Normal range of motion.     Cervical back: Neck supple.     Right lower leg: No edema.     Left lower leg: No edema.  Lymphadenopathy:     Cervical: No cervical adenopathy.  Skin:    General: Skin is warm and dry.  Neurological:      General: No focal deficit present.     Mental Status: He is alert. Mental status is at baseline.  Psychiatric:        Mood and Affect: Mood normal.        Behavior: Behavior normal.     Lab Results  Component Value Date   WBC 4.1 03/22/2020   HGB 14.2 03/22/2020   HCT 42.5 03/22/2020   PLT 156.0 03/22/2020   GLUCOSE 119 (H) 06/20/2021   CHOL 155 06/20/2021   TRIG 100.0 06/20/2021   HDL 51.60 06/20/2021   LDLCALC 83 06/20/2021   ALT 25 06/20/2021   AST 27 06/20/2021   NA 137 06/20/2021   K 4.1 06/20/2021   CL 98 06/20/2021   CREATININE 0.97 06/20/2021   BUN 22 06/20/2021   CO2 33 (H) 06/20/2021   TSH 2.13 06/20/2021   INR 1.3 (H) 03/08/2019   HGBA1C 6.4 06/20/2021   MICROALBUR 2.4 (H) 06/20/2021    US Abdomen Complete  Result Date: 04/15/2018 CLINICAL DATA:  RIGHT upper quadrant abdominal pain. EXAM: ABDOMEN ULTRASOUND COMPLETE COMPARISON:  None. FINDINGS: Gallbladder: No gallstones or wall thickening visualized. No sonographic Murphy sign noted by sonographer. Common bile duct: Diameter: Normal at 4 mm Liver: Heterogeneous liver echotexture. No focal lesion. No duct dilatation. Portal vein is patent on color Doppler imaging with normal direction of blood flow towards the liver. IVC: No abnormality visualized. Pancreas: Visualized portion unremarkable. Spleen: Size and appearance within normal limits. Right Kidney: Length: 10.3 cm. 2 anechoic cysts in lower pole measuring 1.73.6 cm. Left Kidney: Length: 9.9 cm. Large anechoic cysts measuring 5 cm. Second smaller 1 mm anechoic cyst. Abdominal aorta: No aneurysm visualized. Other findings: None. IMPRESSION: 1. No evidence of cholecystitis.  No acute findings. 2. Benign appearing renal cysts. Electronically Signed   By: Genevive Bi M.D.   On: 04/15/2018 11:50    Assessment & Plan:   Atherosclerosis of native coronary artery of native heart without angina pectoris -     Atorvastatin Calcium; Take 1 tablet (40 mg total)  by mouth daily.  Dispense: 90 tablet; Refill: 1 -     Metoprolol Tartrate; Take 0.5 tablets (12.5 mg total) by mouth 2 (two) times daily.  Dispense: 90 tablet; Refill: 1  Diabetes mellitus type 2 with neurological manifestations (HCC)- Blood sugar is well controlled. -     OneTouch Verio; USE TO CHECK BLOOD SUGARS TWICE A DAY  Dispense: 100 each; Refill: 3 -     OneTouch Delica Lancets 33G; Use to help check blood sugars twice a day Dx  E11.9  Dispense: 100 each; Refill: 3 -     metFORMIN HCl; Take 2 tablets (1,000 mg total) by mouth 2 (two) times daily with a meal.  Dispense: 360 tablet; Refill: 1 -     HM Diabetes Foot Exam  Gastroesophageal reflux disease with esophagitis- Sx's are well controlled. -     Omeprazole; Take 1 capsule (40 mg total) by mouth daily.  Dispense: 90 capsule; Refill: 1 -     CBC with Differential/Platelet; Future  Hyperlipidemia with target LDL less than 100- LDL goal achieved. Doing well on the statin  -     Atorvastatin Calcium; Take 1 tablet (40 mg total) by mouth daily.  Dispense: 90 tablet; Refill: 1  Essential hypertension, benign - His BP is well controlled. -     Metoprolol Tartrate; Take 0.5 tablets (12.5 mg total) by mouth 2 (two) times daily.  Dispense: 90 tablet; Refill: 1     Follow-up: No follow-ups on file.  Sanda Linger, MD

## 2022-08-19 ENCOUNTER — Other Ambulatory Visit: Payer: Self-pay

## 2022-08-19 ENCOUNTER — Other Ambulatory Visit (HOSPITAL_COMMUNITY): Payer: Self-pay

## 2022-08-20 ENCOUNTER — Ambulatory Visit: Payer: Medicare PPO | Admitting: Podiatry

## 2022-08-22 ENCOUNTER — Other Ambulatory Visit (HOSPITAL_COMMUNITY): Payer: Self-pay

## 2022-08-25 DIAGNOSIS — H33312 Horseshoe tear of retina without detachment, left eye: Secondary | ICD-10-CM | POA: Diagnosis not present

## 2022-08-25 DIAGNOSIS — H04123 Dry eye syndrome of bilateral lacrimal glands: Secondary | ICD-10-CM | POA: Diagnosis not present

## 2022-08-25 DIAGNOSIS — H53143 Visual discomfort, bilateral: Secondary | ICD-10-CM | POA: Diagnosis not present

## 2022-08-25 DIAGNOSIS — H5203 Hypermetropia, bilateral: Secondary | ICD-10-CM | POA: Diagnosis not present

## 2022-08-25 DIAGNOSIS — E119 Type 2 diabetes mellitus without complications: Secondary | ICD-10-CM | POA: Diagnosis not present

## 2022-08-25 DIAGNOSIS — H52223 Regular astigmatism, bilateral: Secondary | ICD-10-CM | POA: Diagnosis not present

## 2022-08-25 DIAGNOSIS — H59812 Chorioretinal scars after surgery for detachment, left eye: Secondary | ICD-10-CM | POA: Diagnosis not present

## 2022-08-25 DIAGNOSIS — H524 Presbyopia: Secondary | ICD-10-CM | POA: Diagnosis not present

## 2022-09-07 ENCOUNTER — Other Ambulatory Visit: Payer: Self-pay

## 2022-09-07 ENCOUNTER — Emergency Department (HOSPITAL_COMMUNITY): Payer: Medicare PPO

## 2022-09-07 ENCOUNTER — Encounter (HOSPITAL_COMMUNITY): Payer: Self-pay

## 2022-09-07 ENCOUNTER — Emergency Department (HOSPITAL_COMMUNITY)
Admission: EM | Admit: 2022-09-07 | Discharge: 2022-09-07 | Disposition: A | Discharge status: Home / Self Care | Payer: Medicare PPO | Attending: Emergency Medicine | Admitting: Emergency Medicine

## 2022-09-07 DIAGNOSIS — N189 Chronic kidney disease, unspecified: Secondary | ICD-10-CM | POA: Diagnosis not present

## 2022-09-07 DIAGNOSIS — M25561 Pain in right knee: Secondary | ICD-10-CM | POA: Diagnosis not present

## 2022-09-07 DIAGNOSIS — Z7982 Long term (current) use of aspirin: Secondary | ICD-10-CM | POA: Insufficient documentation

## 2022-09-07 DIAGNOSIS — S0990XA Unspecified injury of head, initial encounter: Secondary | ICD-10-CM | POA: Diagnosis not present

## 2022-09-07 DIAGNOSIS — Z7984 Long term (current) use of oral hypoglycemic drugs: Secondary | ICD-10-CM | POA: Diagnosis not present

## 2022-09-07 DIAGNOSIS — Z8673 Personal history of transient ischemic attack (TIA), and cerebral infarction without residual deficits: Secondary | ICD-10-CM | POA: Insufficient documentation

## 2022-09-07 DIAGNOSIS — S61211S Laceration without foreign body of left index finger without damage to nail, sequela: Secondary | ICD-10-CM

## 2022-09-07 DIAGNOSIS — S0510XA Contusion of eyeball and orbital tissues, unspecified eye, initial encounter: Secondary | ICD-10-CM | POA: Diagnosis not present

## 2022-09-07 DIAGNOSIS — R0902 Hypoxemia: Secondary | ICD-10-CM | POA: Diagnosis not present

## 2022-09-07 DIAGNOSIS — Z043 Encounter for examination and observation following other accident: Secondary | ICD-10-CM | POA: Diagnosis not present

## 2022-09-07 DIAGNOSIS — I129 Hypertensive chronic kidney disease with stage 1 through stage 4 chronic kidney disease, or unspecified chronic kidney disease: Secondary | ICD-10-CM | POA: Diagnosis not present

## 2022-09-07 DIAGNOSIS — Z79899 Other long term (current) drug therapy: Secondary | ICD-10-CM | POA: Insufficient documentation

## 2022-09-07 DIAGNOSIS — M79642 Pain in left hand: Secondary | ICD-10-CM | POA: Diagnosis not present

## 2022-09-07 DIAGNOSIS — M25462 Effusion, left knee: Secondary | ICD-10-CM | POA: Diagnosis not present

## 2022-09-07 DIAGNOSIS — M1712 Unilateral primary osteoarthritis, left knee: Secondary | ICD-10-CM | POA: Diagnosis not present

## 2022-09-07 DIAGNOSIS — W19XXXA Unspecified fall, initial encounter: Secondary | ICD-10-CM | POA: Diagnosis not present

## 2022-09-07 DIAGNOSIS — S80212A Abrasion, left knee, initial encounter: Secondary | ICD-10-CM | POA: Diagnosis not present

## 2022-09-07 DIAGNOSIS — S51012A Laceration without foreign body of left elbow, initial encounter: Secondary | ICD-10-CM | POA: Diagnosis not present

## 2022-09-07 DIAGNOSIS — S0993XA Unspecified injury of face, initial encounter: Secondary | ICD-10-CM | POA: Diagnosis not present

## 2022-09-07 DIAGNOSIS — W01198A Fall on same level from slipping, tripping and stumbling with subsequent striking against other object, initial encounter: Secondary | ICD-10-CM | POA: Insufficient documentation

## 2022-09-07 DIAGNOSIS — S80211A Abrasion, right knee, initial encounter: Secondary | ICD-10-CM | POA: Diagnosis not present

## 2022-09-07 DIAGNOSIS — I252 Old myocardial infarction: Secondary | ICD-10-CM | POA: Diagnosis not present

## 2022-09-07 DIAGNOSIS — Z87891 Personal history of nicotine dependence: Secondary | ICD-10-CM | POA: Insufficient documentation

## 2022-09-07 DIAGNOSIS — S61412A Laceration without foreign body of left hand, initial encounter: Secondary | ICD-10-CM

## 2022-09-07 DIAGNOSIS — E1122 Type 2 diabetes mellitus with diabetic chronic kidney disease: Secondary | ICD-10-CM | POA: Diagnosis not present

## 2022-09-07 DIAGNOSIS — S01112A Laceration without foreign body of left eyelid and periocular area, initial encounter: Secondary | ICD-10-CM | POA: Diagnosis not present

## 2022-09-07 DIAGNOSIS — S0181XA Laceration without foreign body of other part of head, initial encounter: Secondary | ICD-10-CM | POA: Diagnosis not present

## 2022-09-07 DIAGNOSIS — Z955 Presence of coronary angioplasty implant and graft: Secondary | ICD-10-CM | POA: Insufficient documentation

## 2022-09-07 DIAGNOSIS — M7652 Patellar tendinitis, left knee: Secondary | ICD-10-CM | POA: Diagnosis not present

## 2022-09-07 DIAGNOSIS — M1711 Unilateral primary osteoarthritis, right knee: Secondary | ICD-10-CM | POA: Diagnosis not present

## 2022-09-07 DIAGNOSIS — M25551 Pain in right hip: Secondary | ICD-10-CM | POA: Diagnosis not present

## 2022-09-07 DIAGNOSIS — M19042 Primary osteoarthritis, left hand: Secondary | ICD-10-CM | POA: Diagnosis not present

## 2022-09-07 DIAGNOSIS — I10 Essential (primary) hypertension: Secondary | ICD-10-CM | POA: Diagnosis not present

## 2022-09-07 DIAGNOSIS — M25552 Pain in left hip: Secondary | ICD-10-CM | POA: Diagnosis not present

## 2022-09-07 DIAGNOSIS — M25461 Effusion, right knee: Secondary | ICD-10-CM | POA: Diagnosis not present

## 2022-09-07 DIAGNOSIS — R000 Tachycardia, unspecified: Secondary | ICD-10-CM | POA: Diagnosis not present

## 2022-09-07 MED ORDER — AMOXICILLIN-POT CLAVULANATE 875-125 MG PO TABS: 1.0000 | ORAL_TABLET | Freq: Two times a day (BID) | ORAL | 0 refills | Status: DC

## 2022-09-07 MED ORDER — AMOXICILLIN-POT CLAVULANATE 875-125 MG PO TABS
1.0000 | ORAL_TABLET | Freq: Once | ORAL | Status: AC
  Administered 2022-09-07: 1 via ORAL
  Filled 2022-09-07: qty 1
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
## 2022-09-07 NOTE — ED Provider Notes (Signed)
[place] EMERGENCY DEPARTMENT AT [facility] Provider Note   CSN: [id number] Arrival date & time: 09/07/22  1756     History  Chief Complaint  Patient presents with   Marletta Lor    Manuel [name] is a 89 y.o. male.  Patient brought in by family members.  Patient had a fall he and his wife are outside she started to fall he tried to stop her fall.  He did break her fall but he hit his forehead on the bumper of their car resulting in a laceration above the left eyebrow.  Also a lot of bruising around his left eye.  Skin tear to his left elbow 2 lacerations to the left hand.  1 in the hypothenar eminence since.  Without significant bleeding.  The other 1 was his left index finger that actually looked a little old and family members did confirm that they think that that may have occurred when he was trying to take food away from their dog and there was some question of whether he got bit by the dog.  Looks more like a skin tear.  But certainly could have been the finger is red and swollen he is got good range of motion of that without any increased pain.  Patient also has abrasions to both knees.  He came in with a cervical collar on.  But denied any back pain upper or lower denied any neck pain.  Patient is not on any significant blood thinners.  He is on an aspirin a day.  Tetanus is up-to-date.  He is mentating fine.  Patient is 62 he lives with he and his wife live with the daughter.  He is a former smoker quit in 1968.  Past medical history significant for MI in 2011 diabetes hyperlipidemia chronic kidney disease hypertension.  Past surgical history just significant for appendectomy and coronary stent placement.       Home Medications Prior to Admission medications   Medication Sig Start Date End Date Taking? Authorizing Provider  amoxicillin-clavulanate (AUGMENTIN) 875-125 MG tablet Take 1 tablet by mouth every 12 (twelve) hours. 09/07/22  Yes Vanetta Mulders, MD  aspirin EC 81 MG EC  tablet Take 1 tablet (81 mg total) by mouth daily. 06/20/10  Yes Laurey Morale, MD  atorvastatin (LIPITOR) 40 MG tablet Take 1 tablet (40 mg total) by mouth daily. 08/18/22  Yes Etta Grandchild, MD  irbesartan (AVAPRO) 150 MG tablet Take 1 tablet (150 mg total) by mouth daily. 06/13/22  Yes Jake Bathe, MD  metFORMIN (GLUCOPHAGE) 500 MG tablet Take 2 tablets (1,000 mg total) by mouth 2 (two) times daily with a meal. 08/18/22  Yes Etta Grandchild, MD  metoprolol tartrate (LOPRESSOR) 25 MG tablet Take 0.5 tablets (12.5 mg total) by mouth 2 (two) times daily. 08/18/22  Yes Etta Grandchild, MD  Multiple Vitamin (MULTIVITAMIN) tablet Take 1 tablet by mouth daily.   Yes [provider]  nitroGLYCERIN (NITROSTAT) 0.4 MG SL tablet Place 1 tablet (0.4 mg total) under the tongue every 5 (five) minutes as needed for chest pain. 06/13/22  Yes Jake Bathe, MD  omeprazole (PRILOSEC) 40 MG capsule Take 1 capsule (40 mg total) by mouth daily. 08/18/22  Yes Etta Grandchild, MD  Blood Glucose Monitoring Suppl (ONETOUCH VERIO FLEX SYSTEM) w/Device KIT 1 Device by Does not apply route daily. Use to check blood sugars daily Dx E11.9 02/13/16   Etta Grandchild, MD  glucose blood [facility]  VERIO) test strip USE TO CHECK BLOOD SUGARS TWICE A DAY 08/18/22   Etta Grandchild, MD  OneTouch Delica Lancets 33G MISC Use to help check blood sugars twice a day Dx E11.9 08/18/22   Etta Grandchild, MD      Allergies    Other    Review of Systems   Review of Systems  Constitutional:  Negative for chills and fever.  HENT:  Negative for ear pain and sore throat.   Eyes:  Negative for pain and visual disturbance.  Respiratory:  Negative for cough and shortness of breath.   Cardiovascular:  Negative for chest pain and palpitations.  Gastrointestinal:  Negative for abdominal pain and vomiting.  Genitourinary:  Negative for dysuria and hematuria.  Musculoskeletal:  Negative for arthralgias and back pain.  Skin:  Positive  for wound. Negative for color change and rash.  Neurological:  Negative for seizures and syncope.  All other systems reviewed and are negative.   Physical Exam Updated Vital Signs BP (!) 139/92   Pulse 91   Temp 97.9 F (36.6 C) (Oral)   Resp 20   SpO2 93%  Physical Exam Vitals and nursing note reviewed.  Constitutional:      General: He is not in acute distress.    Appearance: Normal appearance. He is well-developed. He is not ill-appearing.  HENT:     Head: Normocephalic.     Comments: 3 cm laceration above the left eyebrow.  With a little bit of a hematoma.  No active bleeding.  Lots of bruising around the left eye.  Some swelling to the bridge of the nose without any evidence of deformity.  No septal hematoma no nasal bleeding.    Mouth/Throat:     Mouth: Mucous membranes are moist.     Pharynx: Oropharynx is clear.  Eyes:     Extraocular Movements: Extraocular movements intact.     Conjunctiva/sclera: Conjunctivae normal.     Pupils: Pupils are equal, round, and reactive to light.  Neck:     Comments: Cervical collar in place. Cardiovascular:     Rate and Rhythm: Normal rate and regular rhythm.     Heart sounds: No murmur heard. Pulmonary:     Effort: Pulmonary effort is normal. No respiratory distress.     Breath sounds: Normal breath sounds. No wheezing or rales.  Abdominal:     Palpations: Abdomen is soft.     Tenderness: There is no abdominal tenderness. There is no guarding.  Musculoskeletal:        General: Signs of injury present. No swelling.     Comments: Superficial skin tear to the left elbow.  On the left hand with what appears to be an old laceration mentioned in history of present illness to the left index finger.  With a little swelling of the entire finger but not a sausage finger.  Good range of motion flexion extension without any significant pain.  No tender to palpation of the finger.  On the hypothenar emesis of the left hand there there is a  superficial laceration avulsion without any active bleeding.  Good movement of the little finger on that hand.  Cap refill in place and sensation intact.  Good radial pulse.  Bilateral lower extremities with abrasions to both knees no patella dislocation no evidence of any significant effusion.  Distally neurovascularly intact.  Able to move both legs at the hips without any additional  Skin:    General: Skin is warm and dry.  Capillary Refill: Capillary refill takes less than 2 seconds.  Neurological:     General: No focal deficit present.     Mental Status: He is alert and oriented to person, place, and time.  Psychiatric:        Mood and Affect: Mood normal.     ED Results / Procedures / Treatments   Labs (all labs ordered are listed, but only abnormal results are displayed) Labs Reviewed - No data to display  EKG EKG Interpretation Date/Time:  Sunday September 07 2022 18:07:42 EDT Ventricular Rate:  117 PR Interval:    QRS Duration:  88 QT Interval:  336 QTC Calculation: 469 R Axis:   -46  Text Interpretation: Junctional tachycardia Left anterior fascicular block No previous ECGs available Confirmed by Vanetta Mulders [phone]) on 09/07/2022 6:14:42 PM  Radiology DG Hips Bilat W or Wo Pelvis 3-4 Views  Result Date: 09/07/2022 CLINICAL DATA:  Fall, hip pain . EXAM: DG HIP (WITH OR WITHOUT PELVIS) 3-4V BILAT COMPARISON:  None Available. FINDINGS: There is no evidence of hip fracture or dislocation. There is no evidence of arthropathy or other focal bone abnormality. Vascular calcifications noted IMPRESSION: 1. Negative. Electronically Signed   By: Helyn Numbers M.D.   On: 09/07/2022 20:15   DG Knee Complete 4 Views Right  Result Date: 09/07/2022 CLINICAL DATA:  Fall, right knee pain EXAM: RIGHT KNEE - COMPLETE 4+ VIEW COMPARISON:  None Available. FINDINGS: Normal alignment. No acute fracture or dislocation. Mild medial compartment degenerative arthritis. Small right knee effusion.  Vascular calcifications are noted. Mild degenerative enthesopathy seen involving the insertion of the quadriceps tendon upon the patella. Mild prepatellar soft tissue swelling. IMPRESSION: 1. Mild medial compartment degenerative arthritis. 2. Small right knee effusion. Mild prepatellar soft tissue swelling. Electronically Signed   By: Helyn Numbers M.D.   On: 09/07/2022 20:14   DG Knee Complete 4 Views Left  Result Date: 09/07/2022 CLINICAL DATA:  Fall EXAM: LEFT KNEE - COMPLETE 4+ VIEW COMPARISON:  None Available. FINDINGS: Normal alignment. No acute fracture or dislocation. Mild degenerative changes are noted within the medial compartment. Small left knee effusion. Vascular calcifications are noted. Degenerative enthesopathy is seen involving the insertion the quadriceps tendon upon the patella. IMPRESSION: 1. Mild degenerative change. Small left knee effusion. Electronically Signed   By: Helyn Numbers M.D.   On: 09/07/2022 20:13   DG Hand Complete Left  Result Date: 09/07/2022 CLINICAL DATA:  Fall, left hand pain EXAM: LEFT HAND - COMPLETE 3+ VIEW COMPARISON:  None Available. FINDINGS: Normal alignment. No acute fracture or dislocation. Advanced degenerative changes are noted involving the interphalangeal joint of the thumb as well as the PIP and DIP joints of the second through fifth digits. Mild degenerative changes are seen within the carpus. Soft tissues are unremarkable. IMPRESSION: 1. Degenerative changes. No acute fracture or dislocation. Electronically Signed   By: Helyn Numbers M.D.   On: 09/07/2022 20:12   CT Head Wo Contrast  Result Date: 09/07/2022 CLINICAL DATA:  Blunt facial trauma from fall EXAM: CT HEAD WITHOUT CONTRAST CT MAXILLOFACIAL WITHOUT CONTRAST CT CERVICAL SPINE WITHOUT CONTRAST TECHNIQUE: Multidetector CT imaging of the head, cervical spine, and maxillofacial structures were performed using the standard protocol without intravenous contrast. Multiplanar CT image  reconstructions of the cervical spine and maxillofacial structures were also generated. RADIATION DOSE REDUCTION: This exam was performed according to the departmental dose-optimization program which includes automated exposure control, adjustment of the mA and/or kV according to patient size and/or  use of iterative reconstruction technique. COMPARISON:  None Available. FINDINGS: CT HEAD FINDINGS Brain: No intracranial hemorrhage, mass effect, or evidence of acute infarct. No hydrocephalus. No extra-axial fluid collection. Generalized cerebral atrophy. Ill-defined hypoattenuation within the cerebral white matter is nonspecific but consistent with chronic small vessel ischemic disease. Chronic right cerebellar infarct. Prominent CSF space or arachnoid cyst in the anterior left temporal fossa. Vascular: No hyperdense vessel. Intracranial arterial calcification. Skull: No fracture or focal lesion. Left periorbital/forehead hematoma Other: None. CT MAXILLOFACIAL FINDINGS Osseous: No fracture or mandibular dislocation. No destructive process. Orbits: Postoperative changes both globes. Globes are intact. No postseptal hematoma. Sinuses: Mucosal thickening in the ethmoid air cells and left frontal sinus. Leftward nasal septal deviation. Soft tissues: Negative. CT CERVICAL SPINE FINDINGS Alignment: No evidence of traumatic malalignment. Skull base and vertebrae: No acute fracture. No primary bone lesion or focal pathologic process. Soft tissues and spinal canal: No prevertebral fluid or swelling. No visible canal hematoma. Disc levels: Multilevel advanced spondylosis, disc space height loss, and degenerative endplate changes. Advanced facet arthropathy on the left at C3-C4 and C4-C5. Posterior disc osteophyte complexes cause up to mild effacement of thecal sac greatest at C5-C6. No severe spinal canal narrowing. Upper chest: No acute abnormality. Other: Carotid calcification. IMPRESSION: 1. No acute intracranial  abnormality. 2. No calvarial or facial fracture. Large left forehead/periorbital hematoma. 3. No acute fracture in the cervical spine. Electronically Signed   By: Minerva Fester M.D.   On: 09/07/2022 19:57   CT Maxillofacial WO CM  Result Date: 09/07/2022 CLINICAL DATA:  Blunt facial trauma from fall EXAM: CT HEAD WITHOUT CONTRAST CT MAXILLOFACIAL WITHOUT CONTRAST CT CERVICAL SPINE WITHOUT CONTRAST TECHNIQUE: Multidetector CT imaging of the head, cervical spine, and maxillofacial structures were performed using the standard protocol without intravenous contrast. Multiplanar CT image reconstructions of the cervical spine and maxillofacial structures were also generated. RADIATION DOSE REDUCTION: This exam was performed according to the departmental dose-optimization program which includes automated exposure control, adjustment of the mA and/or kV according to patient size and/or use of iterative reconstruction technique. COMPARISON:  None Available. FINDINGS: CT HEAD FINDINGS Brain: No intracranial hemorrhage, mass effect, or evidence of acute infarct. No hydrocephalus. No extra-axial fluid collection. Generalized cerebral atrophy. Ill-defined hypoattenuation within the cerebral white matter is nonspecific but consistent with chronic small vessel ischemic disease. Chronic right cerebellar infarct. Prominent CSF space or arachnoid cyst in the anterior left temporal fossa. Vascular: No hyperdense vessel. Intracranial arterial calcification. Skull: No fracture or focal lesion. Left periorbital/forehead hematoma Other: None. CT MAXILLOFACIAL FINDINGS Osseous: No fracture or mandibular dislocation. No destructive process. Orbits: Postoperative changes both globes. Globes are intact. No postseptal hematoma. Sinuses: Mucosal thickening in the ethmoid air cells and left frontal sinus. Leftward nasal septal deviation. Soft tissues: Negative. CT CERVICAL SPINE FINDINGS Alignment: No evidence of traumatic malalignment.  Skull base and vertebrae: No acute fracture. No primary bone lesion or focal pathologic process. Soft tissues and spinal canal: No prevertebral fluid or swelling. No visible canal hematoma. Disc levels: Multilevel advanced spondylosis, disc space height loss, and degenerative endplate changes. Advanced facet arthropathy on the left at C3-C4 and C4-C5. Posterior disc osteophyte complexes cause up to mild effacement of thecal sac greatest at C5-C6. No severe spinal canal narrowing. Upper chest: No acute abnormality. Other: Carotid calcification. IMPRESSION: 1. No acute intracranial abnormality. 2. No calvarial or facial fracture. Large left forehead/periorbital hematoma. 3. No acute fracture in the cervical spine. Electronically Signed   By: Minerva Fester  M.D.   On: 09/07/2022 19:57   CT Cervical Spine Wo Contrast  Result Date: 09/07/2022 CLINICAL DATA:  Blunt facial trauma from fall EXAM: CT HEAD WITHOUT CONTRAST CT MAXILLOFACIAL WITHOUT CONTRAST CT CERVICAL SPINE WITHOUT CONTRAST TECHNIQUE: Multidetector CT imaging of the head, cervical spine, and maxillofacial structures were performed using the standard protocol without intravenous contrast. Multiplanar CT image reconstructions of the cervical spine and maxillofacial structures were also generated. RADIATION DOSE REDUCTION: This exam was performed according to the departmental dose-optimization program which includes automated exposure control, adjustment of the mA and/or kV according to patient size and/or use of iterative reconstruction technique. COMPARISON:  None Available. FINDINGS: CT HEAD FINDINGS Brain: No intracranial hemorrhage, mass effect, or evidence of acute infarct. No hydrocephalus. No extra-axial fluid collection. Generalized cerebral atrophy. Ill-defined hypoattenuation within the cerebral white matter is nonspecific but consistent with chronic small vessel ischemic disease. Chronic right cerebellar infarct. Prominent CSF space or arachnoid  cyst in the anterior left temporal fossa. Vascular: No hyperdense vessel. Intracranial arterial calcification. Skull: No fracture or focal lesion. Left periorbital/forehead hematoma Other: None. CT MAXILLOFACIAL FINDINGS Osseous: No fracture or mandibular dislocation. No destructive process. Orbits: Postoperative changes both globes. Globes are intact. No postseptal hematoma. Sinuses: Mucosal thickening in the ethmoid air cells and left frontal sinus. Leftward nasal septal deviation. Soft tissues: Negative. CT CERVICAL SPINE FINDINGS Alignment: No evidence of traumatic malalignment. Skull base and vertebrae: No acute fracture. No primary bone lesion or focal pathologic process. Soft tissues and spinal canal: No prevertebral fluid or swelling. No visible canal hematoma. Disc levels: Multilevel advanced spondylosis, disc space height loss, and degenerative endplate changes. Advanced facet arthropathy on the left at C3-C4 and C4-C5. Posterior disc osteophyte complexes cause up to mild effacement of thecal sac greatest at C5-C6. No severe spinal canal narrowing. Upper chest: No acute abnormality. Other: Carotid calcification. IMPRESSION: 1. No acute intracranial abnormality. 2. No calvarial or facial fracture. Large left forehead/periorbital hematoma. 3. No acute fracture in the cervical spine. Electronically Signed   By: Minerva Fester M.D.   On: 09/07/2022 19:57    Procedures .Marland KitchenLaceration Repair  Date/Time: 09/08/2022 12:06 AM  Performed by: Vanetta Mulders, MD Authorized by: Vanetta Mulders, MD   Consent:    Consent obtained:  Verbal   Consent given by:  Patient   Risks, benefits, and alternatives were discussed: yes     Risks discussed:  Infection, poor cosmetic result and poor wound healing   Alternatives discussed:  No treatment Universal protocol:    Procedure explained and questions answered to patient or proxy's satisfaction: yes     Relevant documents present and verified: yes     Imaging  studies available: yes     Immediately prior to procedure, a time out was called: yes     Patient identity confirmed:  Verbally with patient Anesthesia:    Anesthesia method:  None Laceration details:    Location:  Face   Face location:  L eyebrow   Length (cm):  3 Exploration:    Limited defect created (wound extended): no     Hemostasis achieved with:  Direct pressure   Imaging outcome: foreign body not noted     Wound exploration: wound explored through full range of motion and entire depth of wound visualized     Contaminated: no   Treatment:    Area cleansed with:  Shur-Clens   Visualized foreign bodies/material removed: no     Debridement:  None   Undermining:  None  Scar revision: no   Skin repair:    Repair method:  Tissue adhesive Approximation:    Approximation:  Close Repair type:    Repair type:  Simple Post-procedure details:    Dressing:  Open (no dressing)   Procedure completion:  Tolerated well, no immediate complications     Medications Ordered in ED Medications  amoxicillin-clavulanate (AUGMENTIN) 875-125 MG per tablet 1 tablet (1 tablet Oral Given 09/07/22 2135)    ED Course/ Medical Decision Making/ A&P                             Medical Decision Making Amount and/or Complexity of Data Reviewed Radiology: ordered.  Risk Prescription drug management.  CT head and neck without any acute findings.  X-rays of both hips and pelvis without any bony abnormalities.  X-rays of both knees showed a little bit of small right knee effusion.  The right knee had mild medial compartment degenerative arthritis.  The left knee had similar findings.  X-ray of the left hand degenerative changes no acute fracture or dislocation.  As mentioned in the history of present illness.  Most likely the index injury may have been secondary to dog bite patient has not been on any antibiotics dose of Augmentin given here and discharged home on Augmentin.  Follow-up with primary  care doctor precautions for any evidence of tenosynovitis developing which there is no evidence currently.  For the wound to the left forehead Dermabond was applied.  See note for that.  Patient's cervical collar was removed and he was able to move his neck fine without any significant problems.  The hand wounds were cleaned with Shur-Clens irrigated.  Cannot be left open.  Particular the index finger that may have been a dog bite.  X-ray showed no bony abnormalities to that index finger.    Final Clinical Impression(s) / ED Diagnoses Final diagnoses:  Fall, initial encounter  Abrasion of knee, bilateral  Laceration of left index finger, sequela  Laceration of left hand without foreign body, initial encounter  Laceration of forehead, initial encounter    Rx / DC Orders ED Discharge Orders          Ordered    amoxicillin-clavulanate (AUGMENTIN) 875-125 MG tablet  Every 12 hours        09/07/22 2150              Vanetta Mulders, MD 09/08/22 0009

## 2022-09-07 NOTE — Discharge Instructions (Signed)
CT head neck and face without any acute bony abnormalities or any brain injury.  X-rays of both knees without any bony abnormality x-ray of left hand without any bony abnormality.  As we discussed the left index finger may very well be secondary to a dog bite accidental.  Take the Augmentin as directed.  If this gets very painful to move that can be a sign of a deep space infection and needs to get seen right away.  Would recommend following up with primary care doctor this week.  The forehead laceration was glued.  Leave the glue in place.  The abrasions to the knees can be washed with soap and water and antibiotic ointment can be applied twice a day.  The left hand has a laceration to the hyperthenar surface of the palm this also can be washed with soap and water and antibiotic ointment can be applied.  Return for any new or worse symptoms.  Prescription for Augmentin provided and first dose of Augmentin provided here tonight.

## 2022-09-07 NOTE — ED Provider Notes (Incomplete)
[facility] Provider Note   CSN: [id number] Arrival date & time: 09/07/22  1756     History {Add pertinent medical, surgical, social history, OB history to HPI:1} Chief Complaint  Patient presents with  . Fall    Manuel [name] is a 89 y.o. male.  Patient brought in by family members.  Patient had a fall he and his wife are outside she started to fall he tried to stop her fall.  He did break her fall but he hit his forehead on the bumper of their car resulting in a laceration above the left eyebrow.  Also a lot of bruising around his left eye.  Skin tear to his left elbow 2 lacerations to the left hand.  1 in the hypothenar eminence since.  Without significant bleeding.  The other 1 was his left index finger that actually looked a little old and family members did confirm that they think that that may have occurred when he was trying to take food away from their dog and there was some question of whether he got bit by the dog.  Looks more like a skin tear.  But certainly could have been the finger is red and swollen he is got good range of motion of that without any increased pain.  Patient also has abrasions to both knees.  He came in with a cervical collar on.  But denied any back pain upper or lower denied any neck pain.  Patient is not on any significant blood thinners.  He is on an aspirin a day.  Tetanus is up-to-date.  He is mentating fine.  Patient is 71 he lives with he and his wife live with the daughter.  He is a former smoker quit in 1968.  Past medical history significant for MI in 2011 diabetes hyperlipidemia chronic kidney disease hypertension.  Past surgical history just significant for appendectomy and coronary stent placement.       Home Medications Prior to Admission medications   Medication Sig Start Date End Date Taking? Authorizing Provider  amoxicillin-clavulanate (AUGMENTIN) 875-125 MG tablet Take 1 tablet by mouth every 12  (twelve) hours. 09/07/22  Yes Vanetta Mulders, MD  aspirin EC 81 MG EC tablet Take 1 tablet (81 mg total) by mouth daily. 06/20/10  Yes Laurey Morale, MD  atorvastatin (LIPITOR) 40 MG tablet Take 1 tablet (40 mg total) by mouth daily. 08/18/22  Yes Etta Grandchild, MD  irbesartan (AVAPRO) 150 MG tablet Take 1 tablet (150 mg total) by mouth daily. 06/13/22  Yes Jake Bathe, MD  metFORMIN (GLUCOPHAGE) 500 MG tablet Take 2 tablets (1,000 mg total) by mouth 2 (two) times daily with a meal. 08/18/22  Yes Etta Grandchild, MD  metoprolol tartrate (LOPRESSOR) 25 MG tablet Take 0.5 tablets (12.5 mg total) by mouth 2 (two) times daily. 08/18/22  Yes Etta Grandchild, MD  Multiple Vitamin (MULTIVITAMIN) tablet Take 1 tablet by mouth daily.   Yes [provider]  nitroGLYCERIN (NITROSTAT) 0.4 MG SL tablet Place 1 tablet (0.4 mg total) under the tongue every 5 (five) minutes as needed for chest pain. 06/13/22  Yes Jake Bathe, MD  omeprazole (PRILOSEC) 40 MG capsule Take 1 capsule (40 mg total) by mouth daily. 08/18/22  Yes Etta Grandchild, MD  Blood Glucose Monitoring Suppl (ONETOUCH VERIO FLEX SYSTEM) w/Device KIT 1 Device by Does not apply route daily. Use to check blood sugars daily Dx E11.9 02/13/16  Etta Grandchild, MD  glucose blood (ONETOUCH VERIO) test strip USE TO CHECK BLOOD SUGARS TWICE A DAY 08/18/22   Etta Grandchild, MD  OneTouch Delica Lancets 33G MISC Use to help check blood sugars twice a day Dx E11.9 08/18/22   Etta Grandchild, MD      Allergies    Other    Review of Systems   Review of Systems  Constitutional:  Negative for chills and fever.  HENT:  Negative for ear pain and sore throat.   Eyes:  Negative for pain and visual disturbance.  Respiratory:  Negative for cough and shortness of breath.   Cardiovascular:  Negative for chest pain and palpitations.  Gastrointestinal:  Negative for abdominal pain and vomiting.  Genitourinary:  Negative for dysuria and hematuria.   Musculoskeletal:  Negative for arthralgias and back pain.  Skin:  Positive for wound. Negative for color change and rash.  Neurological:  Negative for seizures and syncope.  All other systems reviewed and are negative.   Physical Exam Updated Vital Signs BP (!) 139/92   Pulse 91   Temp 97.9 F (36.6 C) (Oral)   Resp 20   SpO2 93%  Physical Exam  ED Results / Procedures / Treatments   Labs (all labs ordered are listed, but only abnormal results are displayed) Labs Reviewed - No data to display  EKG EKG Interpretation Date/Time:  Sunday September 07 2022 18:07:42 EDT Ventricular Rate:  117 PR Interval:    QRS Duration:  88 QT Interval:  336 QTC Calculation: 469 R Axis:   -46  Text Interpretation: Junctional tachycardia Left anterior fascicular block No previous ECGs available Confirmed by Vanetta Mulders [phone]) on 09/07/2022 6:14:42 PM  Radiology DG Hips Bilat W or Wo Pelvis 3-4 Views  Result Date: 09/07/2022 CLINICAL DATA:  Fall, hip pain . EXAM: DG HIP (WITH OR WITHOUT PELVIS) 3-4V BILAT COMPARISON:  None Available. FINDINGS: There is no evidence of hip fracture or dislocation. There is no evidence of arthropathy or other focal bone abnormality. Vascular calcifications noted IMPRESSION: 1. Negative. Electronically Signed   By: Helyn Numbers M.D.   On: 09/07/2022 20:15   DG Knee Complete 4 Views Right  Result Date: 09/07/2022 CLINICAL DATA:  Fall, right knee pain EXAM: RIGHT KNEE - COMPLETE 4+ VIEW COMPARISON:  None Available. FINDINGS: Normal alignment. No acute fracture or dislocation. Mild medial compartment degenerative arthritis. Small right knee effusion. Vascular calcifications are noted. Mild degenerative enthesopathy seen involving the insertion of the quadriceps tendon upon the patella. Mild prepatellar soft tissue swelling. IMPRESSION: 1. Mild medial compartment degenerative arthritis. 2. Small right knee effusion. Mild prepatellar soft tissue swelling. Electronically  Signed   By: Helyn Numbers M.D.   On: 09/07/2022 20:14   DG Knee Complete 4 Views Left  Result Date: 09/07/2022 CLINICAL DATA:  Fall EXAM: LEFT KNEE - COMPLETE 4+ VIEW COMPARISON:  None Available. FINDINGS: Normal alignment. No acute fracture or dislocation. Mild degenerative changes are noted within the medial compartment. Small left knee effusion. Vascular calcifications are noted. Degenerative enthesopathy is seen involving the insertion the quadriceps tendon upon the patella. IMPRESSION: 1. Mild degenerative change. Small left knee effusion. Electronically Signed   By: Helyn Numbers M.D.   On: 09/07/2022 20:13   DG Hand Complete Left  Result Date: 09/07/2022 CLINICAL DATA:  Fall, left hand pain EXAM: LEFT HAND - COMPLETE 3+ VIEW COMPARISON:  None Available. FINDINGS: Normal alignment. No acute fracture or dislocation. Advanced degenerative changes are  noted involving the interphalangeal joint of the thumb as well as the PIP and DIP joints of the second through fifth digits. Mild degenerative changes are seen within the carpus. Soft tissues are unremarkable. IMPRESSION: 1. Degenerative changes. No acute fracture or dislocation. Electronically Signed   By: Helyn Numbers M.D.   On: 09/07/2022 20:12   CT Head Wo Contrast  Result Date: 09/07/2022 CLINICAL DATA:  Blunt facial trauma from fall EXAM: CT HEAD WITHOUT CONTRAST CT MAXILLOFACIAL WITHOUT CONTRAST CT CERVICAL SPINE WITHOUT CONTRAST TECHNIQUE: Multidetector CT imaging of the head, cervical spine, and maxillofacial structures were performed using the standard protocol without intravenous contrast. Multiplanar CT image reconstructions of the cervical spine and maxillofacial structures were also generated. RADIATION DOSE REDUCTION: This exam was performed according to the departmental dose-optimization program which includes automated exposure control, adjustment of the mA and/or kV according to patient size and/or use of iterative reconstruction  technique. COMPARISON:  None Available. FINDINGS: CT HEAD FINDINGS Brain: No intracranial hemorrhage, mass effect, or evidence of acute infarct. No hydrocephalus. No extra-axial fluid collection. Generalized cerebral atrophy. Ill-defined hypoattenuation within the cerebral white matter is nonspecific but consistent with chronic small vessel ischemic disease. Chronic right cerebellar infarct. Prominent CSF space or arachnoid cyst in the anterior left temporal fossa. Vascular: No hyperdense vessel. Intracranial arterial calcification. Skull: No fracture or focal lesion. Left periorbital/forehead hematoma Other: None. CT MAXILLOFACIAL FINDINGS Osseous: No fracture or mandibular dislocation. No destructive process. Orbits: Postoperative changes both globes. Globes are intact. No postseptal hematoma. Sinuses: Mucosal thickening in the ethmoid air cells and left frontal sinus. Leftward nasal septal deviation. Soft tissues: Negative. CT CERVICAL SPINE FINDINGS Alignment: No evidence of traumatic malalignment. Skull base and vertebrae: No acute fracture. No primary bone lesion or focal pathologic process. Soft tissues and spinal canal: No prevertebral fluid or swelling. No visible canal hematoma. Disc levels: Multilevel advanced spondylosis, disc space height loss, and degenerative endplate changes. Advanced facet arthropathy on the left at C3-C4 and C4-C5. Posterior disc osteophyte complexes cause up to mild effacement of thecal sac greatest at C5-C6. No severe spinal canal narrowing. Upper chest: No acute abnormality. Other: Carotid calcification. IMPRESSION: 1. No acute intracranial abnormality. 2. No calvarial or facial fracture. Large left forehead/periorbital hematoma. 3. No acute fracture in the cervical spine. Electronically Signed   By: Minerva Fester M.D.   On: 09/07/2022 19:57   CT Maxillofacial WO CM  Result Date: 09/07/2022 CLINICAL DATA:  Blunt facial trauma from fall EXAM: CT HEAD WITHOUT CONTRAST CT  MAXILLOFACIAL WITHOUT CONTRAST CT CERVICAL SPINE WITHOUT CONTRAST TECHNIQUE: Multidetector CT imaging of the head, cervical spine, and maxillofacial structures were performed using the standard protocol without intravenous contrast. Multiplanar CT image reconstructions of the cervical spine and maxillofacial structures were also generated. RADIATION DOSE REDUCTION: This exam was performed according to the departmental dose-optimization program which includes automated exposure control, adjustment of the mA and/or kV according to patient size and/or use of iterative reconstruction technique. COMPARISON:  None Available. FINDINGS: CT HEAD FINDINGS Brain: No intracranial hemorrhage, mass effect, or evidence of acute infarct. No hydrocephalus. No extra-axial fluid collection. Generalized cerebral atrophy. Ill-defined hypoattenuation within the cerebral white matter is nonspecific but consistent with chronic small vessel ischemic disease. Chronic right cerebellar infarct. Prominent CSF space or arachnoid cyst in the anterior left temporal fossa. Vascular: No hyperdense vessel. Intracranial arterial calcification. Skull: No fracture or focal lesion. Left periorbital/forehead hematoma Other: None. CT MAXILLOFACIAL FINDINGS Osseous: No fracture or mandibular dislocation. No destructive  process. Orbits: Postoperative changes both globes. Globes are intact. No postseptal hematoma. Sinuses: Mucosal thickening in the ethmoid air cells and left frontal sinus. Leftward nasal septal deviation. Soft tissues: Negative. CT CERVICAL SPINE FINDINGS Alignment: No evidence of traumatic malalignment. Skull base and vertebrae: No acute fracture. No primary bone lesion or focal pathologic process. Soft tissues and spinal canal: No prevertebral fluid or swelling. No visible canal hematoma. Disc levels: Multilevel advanced spondylosis, disc space height loss, and degenerative endplate changes. Advanced facet arthropathy on the left at C3-C4  and C4-C5. Posterior disc osteophyte complexes cause up to mild effacement of thecal sac greatest at C5-C6. No severe spinal canal narrowing. Upper chest: No acute abnormality. Other: Carotid calcification. IMPRESSION: 1. No acute intracranial abnormality. 2. No calvarial or facial fracture. Large left forehead/periorbital hematoma. 3. No acute fracture in the cervical spine. Electronically Signed   By: Minerva Fester M.D.   On: 09/07/2022 19:57   CT Cervical Spine Wo Contrast  Result Date: 09/07/2022 CLINICAL DATA:  Blunt facial trauma from fall EXAM: CT HEAD WITHOUT CONTRAST CT MAXILLOFACIAL WITHOUT CONTRAST CT CERVICAL SPINE WITHOUT CONTRAST TECHNIQUE: Multidetector CT imaging of the head, cervical spine, and maxillofacial structures were performed using the standard protocol without intravenous contrast. Multiplanar CT image reconstructions of the cervical spine and maxillofacial structures were also generated. RADIATION DOSE REDUCTION: This exam was performed according to the departmental dose-optimization program which includes automated exposure control, adjustment of the mA and/or kV according to patient size and/or use of iterative reconstruction technique. COMPARISON:  None Available. FINDINGS: CT HEAD FINDINGS Brain: No intracranial hemorrhage, mass effect, or evidence of acute infarct. No hydrocephalus. No extra-axial fluid collection. Generalized cerebral atrophy. Ill-defined hypoattenuation within the cerebral white matter is nonspecific but consistent with chronic small vessel ischemic disease. Chronic right cerebellar infarct. Prominent CSF space or arachnoid cyst in the anterior left temporal fossa. Vascular: No hyperdense vessel. Intracranial arterial calcification. Skull: No fracture or focal lesion. Left periorbital/forehead hematoma Other: None. CT MAXILLOFACIAL FINDINGS Osseous: No fracture or mandibular dislocation. No destructive process. Orbits: Postoperative changes both globes. Globes  are intact. No postseptal hematoma. Sinuses: Mucosal thickening in the ethmoid air cells and left frontal sinus. Leftward nasal septal deviation. Soft tissues: Negative. CT CERVICAL SPINE FINDINGS Alignment: No evidence of traumatic malalignment. Skull base and vertebrae: No acute fracture. No primary bone lesion or focal pathologic process. Soft tissues and spinal canal: No prevertebral fluid or swelling. No visible canal hematoma. Disc levels: Multilevel advanced spondylosis, disc space height loss, and degenerative endplate changes. Advanced facet arthropathy on the left at C3-C4 and C4-C5. Posterior disc osteophyte complexes cause up to mild effacement of thecal sac greatest at C5-C6. No severe spinal canal narrowing. Upper chest: No acute abnormality. Other: Carotid calcification. IMPRESSION: 1. No acute intracranial abnormality. 2. No calvarial or facial fracture. Large left forehead/periorbital hematoma. 3. No acute fracture in the cervical spine. Electronically Signed   By: Minerva Fester M.D.   On: 09/07/2022 19:57    Procedures Procedures  {Document cardiac monitor, telemetry assessment procedure when appropriate:1}  Medications Ordered in ED Medications  amoxicillin-clavulanate (AUGMENTIN) 875-125 MG per tablet 1 tablet (1 tablet Oral Given 09/07/22 2135)    ED Course/ Medical Decision Making/ A&P   {   Click here for ABCD2, HEART and other calculatorsREFRESH Note before signing :1}                          Medical Decision  Making Amount and/or Complexity of Data Reviewed Radiology: ordered.  Risk Prescription drug management.   ***  {Document critical care time when appropriate:1} {Document review of labs and clinical decision tools ie heart score, Chads2Vasc2 etc:1}  {Document your independent review of radiology images, and any outside records:1} {Document your discussion with family members, caretakers, and with consultants:1} {Document social determinants of health  affecting pt's care:1} {Document your decision making why or why not admission, treatments were needed:1} Final Clinical Impression(s) / ED Diagnoses Final diagnoses:  Fall, initial encounter  Abrasion of knee, bilateral  Laceration of left index finger, sequela  Laceration of left hand without foreign body, initial encounter  Laceration of forehead, initial encounter    Rx / DC Orders ED Discharge Orders          Ordered    amoxicillin-clavulanate (AUGMENTIN) 875-125 MG tablet  Every 12 hours        09/07/22 2150

## 2022-09-11 ENCOUNTER — Telehealth: Payer: Self-pay

## 2022-09-11 NOTE — Transitions of Care (Post Inpatient/ED Visit) (Signed)
   09/11/2022  Name: Manuel [name] MRN: [id number] DOB: Dec 22, 1928  Today's TOC FU Call Status: Today's TOC FU Call Status:: Unsuccessul Call (1st Attempt) Unsuccessful Call (1st Attempt) Date: 09/11/22  Attempted to reach the patient regarding the most recent Inpatient/ED visit.  Follow Up Plan: Additional outreach attempts will be made to reach the patient to complete the Transitions of Care (Post Inpatient/ED visit) call.      Antionette Fairy, RN,BSN,CCM [facility] Care Management Care Management Community Coordinator Direct Phone: [phone] Toll Free: [phone] Fax: [phone]

## 2022-09-11 NOTE — Transitions of Care (Post Inpatient/ED Visit) (Signed)
   09/11/2022  Name: Manuel [name] MRN: [id number] DOB: 12/07/1928  Today's TOC FU Call Status: Today's TOC FU Call Status:: Unsuccessful Call (2nd Attempt) Unsuccessful Call (2nd Attempt) Date: 09/11/22 (Voicemail message received from [email or web address]-return call to her.)  Attempted to reach the patient regarding the most recent Inpatient/ED visit.  Follow Up Plan: Additional outreach attempts will be made to reach the patient to complete the Transitions of Care (Post Inpatient/ED visit) call.     Antionette Fairy, RN,BSN,CCM [facility] Care Management Care Management Community Coordinator Direct Phone: [phone] Toll Free: [phone] Fax: [phone]

## 2022-09-12 ENCOUNTER — Telehealth: Payer: Self-pay

## 2022-09-12 IMAGING — DX DG CHEST 2V
2 series · 2 of 2 positions shown · non-contrast
Comparison: Chest two views 07/02/2010

CLINICAL DATA: Recent cough.  History of asthma.

EXAM:
CHEST - 2 VIEW

[chest pa]
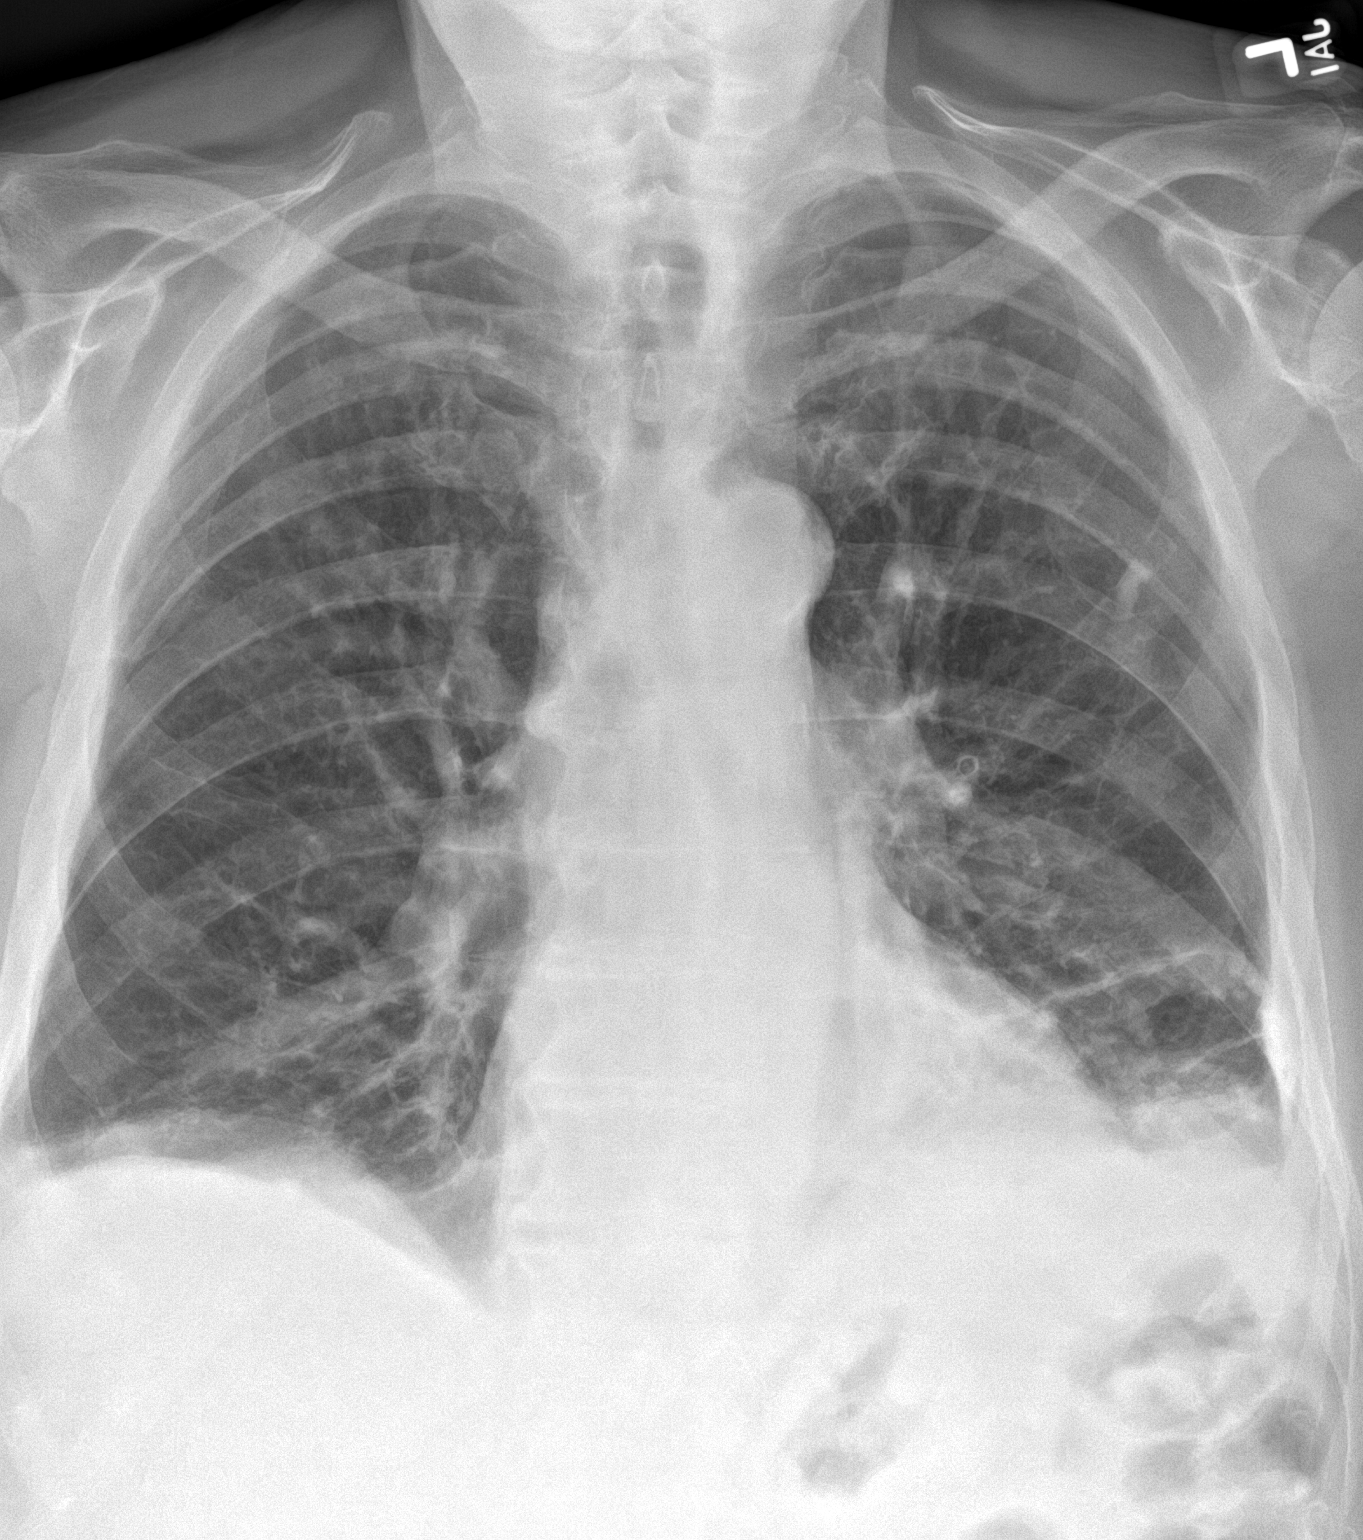

[chest lat]
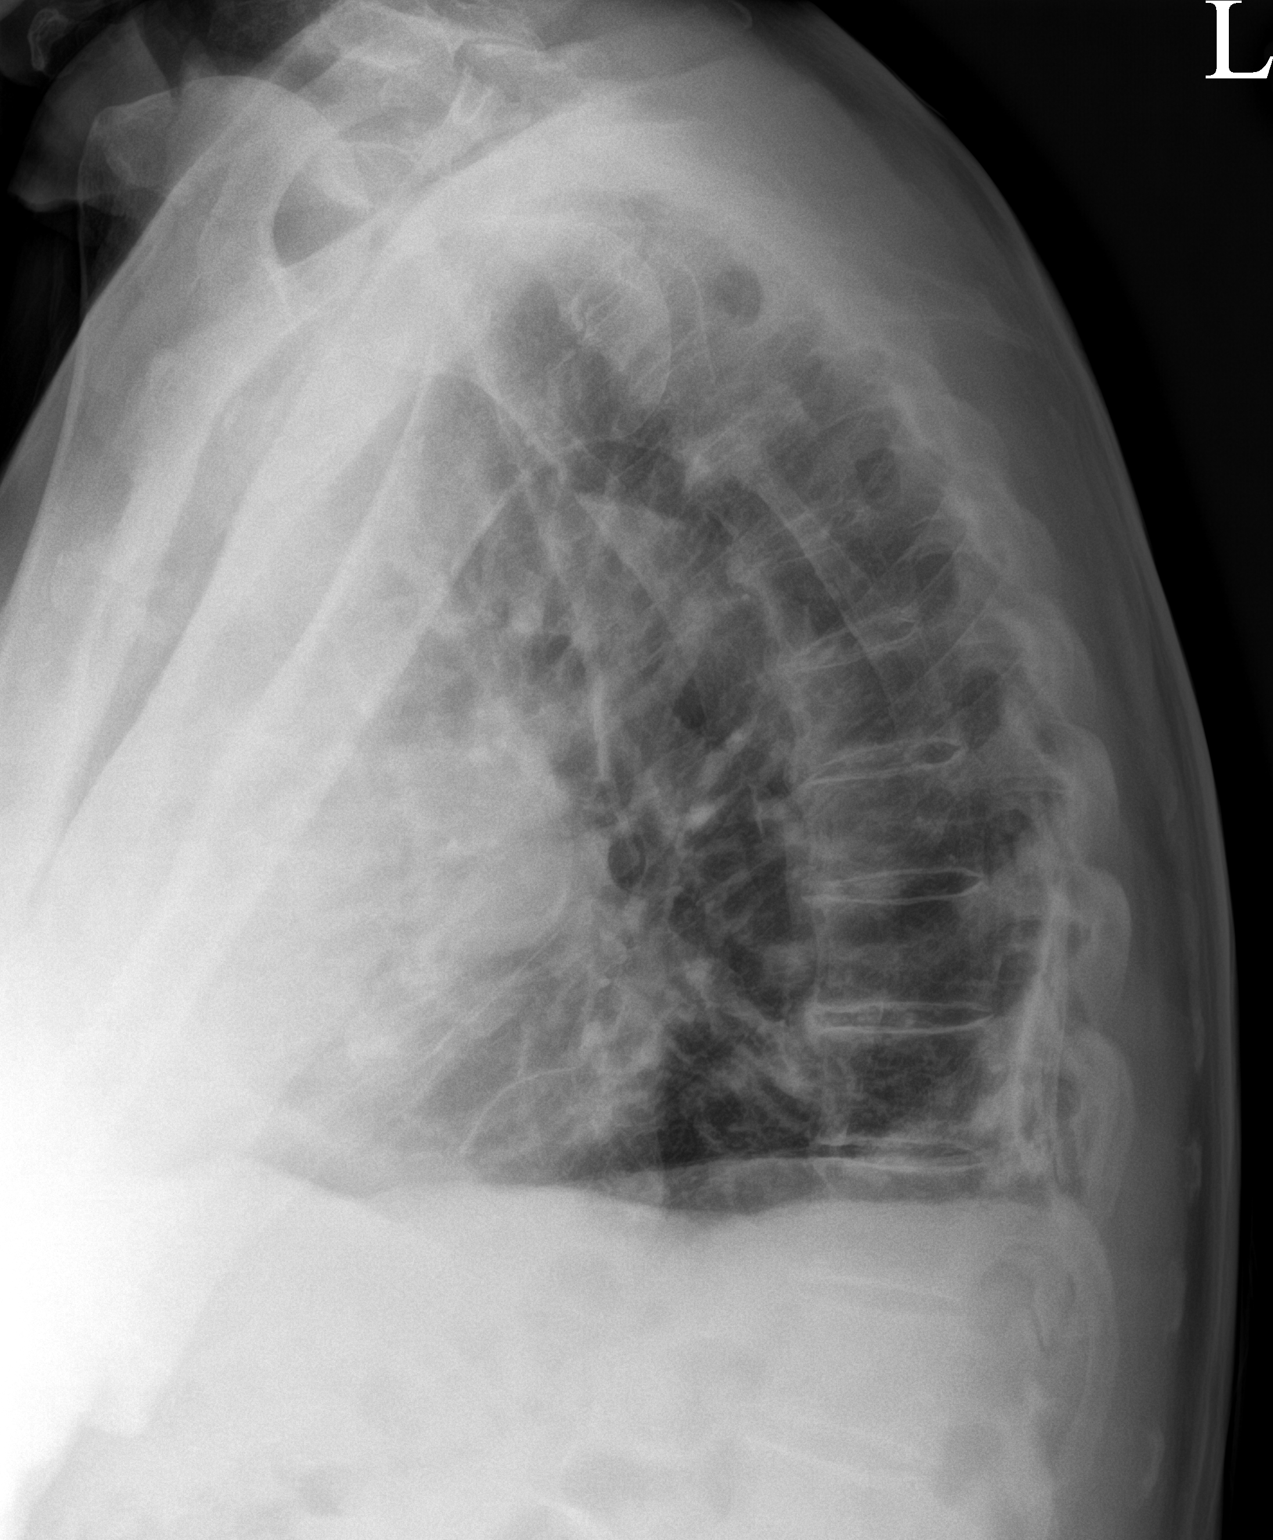

[2 of 2 positions shown; findings below may reference images not displayed]

FINDINGS: Cardiac silhouette and mediastinal contours are within normal limits
mild calcification within aortic arch. Left-greater-than-right
basilar linear scarring appears similar to prior. There is a likely
calcified nodule overlying the mid to upper lateral left lung that
is unchanged from prior, benign.

No definite pleural effusion is seen. There is left basilar
heterogeneous airspace opacification that appears unchanged. No
pneumothorax. Moderate multilevel degenerative bridging osteophytes
of the thoracic spine.
IMPRESSION: There are left-greater-than-right basilar linear opacities with left
basilar heterogeneous opacities on frontal view however these all
appear unchanged from 07/02/2010 and appear to represent scarring.
No definite new airspace opacity to indicate pneumonia.

## 2022-09-12 NOTE — Transitions of Care (Post Inpatient/ED Visit) (Signed)
   09/12/2022  Name: Manuel [name] MRN: [id number] DOB: Sep 02, 1928  Today's TOC FU Call Status: Today's TOC FU Call Status:: Unsuccessful Call (3rd Attempt) Unsuccessful Call (3rd Attempt) Date: 09/12/22  Attempted to reach the patient regarding the most recent Inpatient/ED visit.  Follow Up Plan: No further outreach attempts will be made at this time. We have been unable to contact the patient.  Antionette Fairy, RN,BSN,CCM [facility] Care Management Care Management Community Coordinator Direct Phone: [phone] Toll Free: [phone] Fax: [phone]

## 2022-09-18 ENCOUNTER — Encounter: Payer: Self-pay | Admitting: Internal Medicine

## 2022-09-18 ENCOUNTER — Ambulatory Visit: Payer: Medicare PPO | Admitting: Internal Medicine

## 2022-09-18 VITALS — BP 148/76 | HR 62 | Temp 98.3°F | Ht 70.0 in | Wt 172.0 lb

## 2022-09-18 DIAGNOSIS — I10 Essential (primary) hypertension: Secondary | ICD-10-CM

## 2022-09-18 DIAGNOSIS — Z23 Encounter for immunization: Secondary | ICD-10-CM

## 2022-09-18 DIAGNOSIS — S51812D Laceration without foreign body of left forearm, subsequent encounter: Secondary | ICD-10-CM

## 2022-09-18 DIAGNOSIS — S80219D Abrasion, unspecified knee, subsequent encounter: Secondary | ICD-10-CM

## 2022-09-18 NOTE — Progress Notes (Signed)
Subjective:  Patient ID: Manuel [name], male    DOB: Jul 29, 1928  Age: 89 y.o. MRN: [id number]  CC: Wound Check and Hypertension   HPI Manuel [name] presents for f/up ----  Discussed the use of AI scribe software for clinical note transcription with the patient, who gave verbal consent to proceed.  History of Present Illness   The patient, with a recent fall presented with no complaints of headache, nausea, vomiting, neck or back pain, or joint immobility. He reported achy knees, which is consistent with his chronic condition. The patient has been managing the knee pain with Meloxicam, which has been effective. He also reported a recent wound for which he has been applying Neosporin and bandaging. The patient's tetanus vaccination status was uncertain, with the last known shot being more than 10 years ago.       Outpatient Medications Prior to Visit  Medication Sig Dispense Refill   amoxicillin-clavulanate (AUGMENTIN) 875-125 MG tablet Take 1 tablet by mouth every 12 (twelve) hours. 14 tablet 0   aspirin EC 81 MG EC tablet Take 1 tablet (81 mg total) by mouth daily.     atorvastatin (LIPITOR) 40 MG tablet Take 1 tablet (40 mg total) by mouth daily. 90 tablet 1   Blood Glucose Monitoring Suppl (ONETOUCH VERIO FLEX SYSTEM) w/Device KIT 1 Device by Does not apply route daily. Use to check blood sugars daily Dx E11.9 1 kit 0   glucose blood (ONETOUCH VERIO) test strip USE TO CHECK BLOOD SUGARS TWICE A DAY 100 each 3   irbesartan (AVAPRO) 150 MG tablet Take 1 tablet (150 mg total) by mouth daily. 90 tablet 3   metFORMIN (GLUCOPHAGE) 500 MG tablet Take 2 tablets (1,000 mg total) by mouth 2 (two) times daily with a meal. 360 tablet 1   metoprolol tartrate (LOPRESSOR) 25 MG tablet Take 0.5 tablets (12.5 mg total) by mouth 2 (two) times daily. 90 tablet 1   Multiple Vitamin (MULTIVITAMIN) tablet Take 1 tablet by mouth daily.     nitroGLYCERIN (NITROSTAT) 0.4 MG SL tablet Place 1 tablet  (0.4 mg total) under the tongue every 5 (five) minutes as needed for chest pain. 25 tablet 3   omeprazole (PRILOSEC) 40 MG capsule Take 1 capsule (40 mg total) by mouth daily. 90 capsule 1   OneTouch Delica Lancets 33G MISC Use to help check blood sugars twice a day Dx E11.9 100 each 3   No facility-administered medications prior to visit.    ROS Review of Systems  Constitutional: Negative.   HENT: Negative.    Eyes: Negative.   Respiratory:  Negative for cough, chest tightness, shortness of breath and wheezing.   Cardiovascular:  Negative for chest pain, palpitations and leg swelling.  Gastrointestinal:  Negative for abdominal pain, constipation, diarrhea, nausea and vomiting.  Musculoskeletal:  Positive for arthralgias. Negative for back pain and neck pain.  Skin:  Positive for wound.  Neurological: Negative.  Negative for dizziness, weakness and numbness.  Hematological:  Negative for adenopathy. Does not bruise/bleed easily.  Psychiatric/Behavioral: Negative.      Objective:  BP (!) 148/76 (BP Location: Right Arm, Patient Position: Sitting, Cuff Size: Normal)   Pulse 62   Temp 98.3 F (36.8 C) (Oral)   Ht 5\' 10"  (1.778 m)   Wt 172 lb (78 kg)   SpO2 90%   BMI 24.68 kg/m   BP Readings from Last 3 Encounters:  09/18/22 (!) 148/76  09/07/22 [name])  139/92  08/18/22 (!) 144/86    Wt Readings from Last 3 Encounters:  09/18/22 172 lb (78 kg)  08/18/22 175 lb (79.4 kg)  06/13/22 174 lb (78.9 kg)    Physical Exam Eyes:     General: No scleral icterus.    Conjunctiva/sclera: Conjunctivae normal.  Cardiovascular:     Rate and Rhythm: Normal rate and regular rhythm.     Heart sounds: No murmur heard. Pulmonary:     Effort: Pulmonary effort is normal.     Breath sounds: No stridor. No wheezing, rhonchi or rales.  Abdominal:     General: Abdomen is flat.     Palpations: There is no mass.     Tenderness: There is no abdominal tenderness. There is no guarding.     Hernia:  No hernia is present.  Musculoskeletal:        General: Deformity present.  Lymphadenopathy:     Cervical: No cervical adenopathy.  Skin:    General: Skin is warm and dry.  Neurological:     General: No focal deficit present.     Mental Status: He is alert.  Psychiatric:        Mood and Affect: Mood normal.     Lab Results  Component Value Date   WBC 4.1 03/22/2020   HGB 14.2 03/22/2020   HCT 42.5 03/22/2020   PLT 156.0 03/22/2020   GLUCOSE 119 (H) 06/20/2021   CHOL 155 06/20/2021   TRIG 100.0 06/20/2021   HDL 51.60 06/20/2021   LDLCALC 83 06/20/2021   ALT 25 06/20/2021   AST 27 06/20/2021   NA 137 06/20/2021   K 4.1 06/20/2021   CL 98 06/20/2021   CREATININE 0.97 06/20/2021   BUN 22 06/20/2021   CO2 33 (H) 06/20/2021   TSH 2.13 06/20/2021   INR 1.3 (H) 03/08/2019   HGBA1C 6.4 06/20/2021   MICROALBUR 2.4 (H) 06/20/2021    DG Hips Bilat W or Wo Pelvis 3-4 Views  Result Date: 09/07/2022 CLINICAL DATA:  Fall, hip pain . EXAM: DG HIP (WITH OR WITHOUT PELVIS) 3-4V BILAT COMPARISON:  None Available. FINDINGS: There is no evidence of hip fracture or dislocation. There is no evidence of arthropathy or other focal bone abnormality. Vascular calcifications noted IMPRESSION: 1. Negative. Electronically Signed   By: Helyn Numbers M.D.   On: 09/07/2022 20:15   DG Knee Complete 4 Views Right  Result Date: 09/07/2022 CLINICAL DATA:  Fall, right knee pain EXAM: RIGHT KNEE - COMPLETE 4+ VIEW COMPARISON:  None Available. FINDINGS: Normal alignment. No acute fracture or dislocation. Mild medial compartment degenerative arthritis. Small right knee effusion. Vascular calcifications are noted. Mild degenerative enthesopathy seen involving the insertion of the quadriceps tendon upon the patella. Mild prepatellar soft tissue swelling. IMPRESSION: 1. Mild medial compartment degenerative arthritis. 2. Small right knee effusion. Mild prepatellar soft tissue swelling. Electronically Signed   By:  Helyn Numbers M.D.   On: 09/07/2022 20:14   DG Knee Complete 4 Views Left  Result Date: 09/07/2022 CLINICAL DATA:  Fall EXAM: LEFT KNEE - COMPLETE 4+ VIEW COMPARISON:  None Available. FINDINGS: Normal alignment. No acute fracture or dislocation. Mild degenerative changes are noted within the medial compartment. Small left knee effusion. Vascular calcifications are noted. Degenerative enthesopathy is seen involving the insertion the quadriceps tendon upon the patella. IMPRESSION: 1. Mild degenerative change. Small left knee effusion. Electronically Signed   By: Helyn Numbers M.D.   On: 09/07/2022 20:13   DG Hand Complete Left  Result Date: 09/07/2022 CLINICAL DATA:  Fall, left hand pain EXAM: LEFT HAND - COMPLETE 3+ VIEW COMPARISON:  None Available. FINDINGS: Normal alignment. No acute fracture or dislocation. Advanced degenerative changes are noted involving the interphalangeal joint of the thumb as well as the PIP and DIP joints of the second through fifth digits. Mild degenerative changes are seen within the carpus. Soft tissues are unremarkable. IMPRESSION: 1. Degenerative changes. No acute fracture or dislocation. Electronically Signed   By: Helyn Numbers M.D.   On: 09/07/2022 20:12   CT Head Wo Contrast  Result Date: 09/07/2022 CLINICAL DATA:  Blunt facial trauma from fall EXAM: CT HEAD WITHOUT CONTRAST CT MAXILLOFACIAL WITHOUT CONTRAST CT CERVICAL SPINE WITHOUT CONTRAST TECHNIQUE: Multidetector CT imaging of the head, cervical spine, and maxillofacial structures were performed using the standard protocol without intravenous contrast. Multiplanar CT image reconstructions of the cervical spine and maxillofacial structures were also generated. RADIATION DOSE REDUCTION: This exam was performed according to the departmental dose-optimization program which includes automated exposure control, adjustment of the mA and/or kV according to patient size and/or use of iterative reconstruction technique.  COMPARISON:  None Available. FINDINGS: CT HEAD FINDINGS Brain: No intracranial hemorrhage, mass effect, or evidence of acute infarct. No hydrocephalus. No extra-axial fluid collection. Generalized cerebral atrophy. Ill-defined hypoattenuation within the cerebral white matter is nonspecific but consistent with chronic small vessel ischemic disease. Chronic right cerebellar infarct. Prominent CSF space or arachnoid cyst in the anterior left temporal fossa. Vascular: No hyperdense vessel. Intracranial arterial calcification. Skull: No fracture or focal lesion. Left periorbital/forehead hematoma Other: None. CT MAXILLOFACIAL FINDINGS Osseous: No fracture or mandibular dislocation. No destructive process. Orbits: Postoperative changes both globes. Globes are intact. No postseptal hematoma. Sinuses: Mucosal thickening in the ethmoid air cells and left frontal sinus. Leftward nasal septal deviation. Soft tissues: Negative. CT CERVICAL SPINE FINDINGS Alignment: No evidence of traumatic malalignment. Skull base and vertebrae: No acute fracture. No primary bone lesion or focal pathologic process. Soft tissues and spinal canal: No prevertebral fluid or swelling. No visible canal hematoma. Disc levels: Multilevel advanced spondylosis, disc space height loss, and degenerative endplate changes. Advanced facet arthropathy on the left at C3-C4 and C4-C5. Posterior disc osteophyte complexes cause up to mild effacement of thecal sac greatest at C5-C6. No severe spinal canal narrowing. Upper chest: No acute abnormality. Other: Carotid calcification. IMPRESSION: 1. No acute intracranial abnormality. 2. No calvarial or facial fracture. Large left forehead/periorbital hematoma. 3. No acute fracture in the cervical spine. Electronically Signed   By: Minerva Fester M.D.   On: 09/07/2022 19:57   CT Maxillofacial WO CM  Result Date: 09/07/2022 CLINICAL DATA:  Blunt facial trauma from fall EXAM: CT HEAD WITHOUT CONTRAST CT MAXILLOFACIAL  WITHOUT CONTRAST CT CERVICAL SPINE WITHOUT CONTRAST TECHNIQUE: Multidetector CT imaging of the head, cervical spine, and maxillofacial structures were performed using the standard protocol without intravenous contrast. Multiplanar CT image reconstructions of the cervical spine and maxillofacial structures were also generated. RADIATION DOSE REDUCTION: This exam was performed according to the departmental dose-optimization program which includes automated exposure control, adjustment of the mA and/or kV according to patient size and/or use of iterative reconstruction technique. COMPARISON:  None Available. FINDINGS: CT HEAD FINDINGS Brain: No intracranial hemorrhage, mass effect, or evidence of acute infarct. No hydrocephalus. No extra-axial fluid collection. Generalized cerebral atrophy. Ill-defined hypoattenuation within the cerebral white matter is nonspecific but consistent with chronic small vessel ischemic disease. Chronic right cerebellar infarct. Prominent CSF space or arachnoid cyst in the  anterior left temporal fossa. Vascular: No hyperdense vessel. Intracranial arterial calcification. Skull: No fracture or focal lesion. Left periorbital/forehead hematoma Other: None. CT MAXILLOFACIAL FINDINGS Osseous: No fracture or mandibular dislocation. No destructive process. Orbits: Postoperative changes both globes. Globes are intact. No postseptal hematoma. Sinuses: Mucosal thickening in the ethmoid air cells and left frontal sinus. Leftward nasal septal deviation. Soft tissues: Negative. CT CERVICAL SPINE FINDINGS Alignment: No evidence of traumatic malalignment. Skull base and vertebrae: No acute fracture. No primary bone lesion or focal pathologic process. Soft tissues and spinal canal: No prevertebral fluid or swelling. No visible canal hematoma. Disc levels: Multilevel advanced spondylosis, disc space height loss, and degenerative endplate changes. Advanced facet arthropathy on the left at C3-C4 and C4-C5.  Posterior disc osteophyte complexes cause up to mild effacement of thecal sac greatest at C5-C6. No severe spinal canal narrowing. Upper chest: No acute abnormality. Other: Carotid calcification. IMPRESSION: 1. No acute intracranial abnormality. 2. No calvarial or facial fracture. Large left forehead/periorbital hematoma. 3. No acute fracture in the cervical spine. Electronically Signed   By: Minerva Fester M.D.   On: 09/07/2022 19:57   CT Cervical Spine Wo Contrast  Result Date: 09/07/2022 CLINICAL DATA:  Blunt facial trauma from fall EXAM: CT HEAD WITHOUT CONTRAST CT MAXILLOFACIAL WITHOUT CONTRAST CT CERVICAL SPINE WITHOUT CONTRAST TECHNIQUE: Multidetector CT imaging of the head, cervical spine, and maxillofacial structures were performed using the standard protocol without intravenous contrast. Multiplanar CT image reconstructions of the cervical spine and maxillofacial structures were also generated. RADIATION DOSE REDUCTION: This exam was performed according to the departmental dose-optimization program which includes automated exposure control, adjustment of the mA and/or kV according to patient size and/or use of iterative reconstruction technique. COMPARISON:  None Available. FINDINGS: CT HEAD FINDINGS Brain: No intracranial hemorrhage, mass effect, or evidence of acute infarct. No hydrocephalus. No extra-axial fluid collection. Generalized cerebral atrophy. Ill-defined hypoattenuation within the cerebral white matter is nonspecific but consistent with chronic small vessel ischemic disease. Chronic right cerebellar infarct. Prominent CSF space or arachnoid cyst in the anterior left temporal fossa. Vascular: No hyperdense vessel. Intracranial arterial calcification. Skull: No fracture or focal lesion. Left periorbital/forehead hematoma Other: None. CT MAXILLOFACIAL FINDINGS Osseous: No fracture or mandibular dislocation. No destructive process. Orbits: Postoperative changes both globes. Globes are  intact. No postseptal hematoma. Sinuses: Mucosal thickening in the ethmoid air cells and left frontal sinus. Leftward nasal septal deviation. Soft tissues: Negative. CT CERVICAL SPINE FINDINGS Alignment: No evidence of traumatic malalignment. Skull base and vertebrae: No acute fracture. No primary bone lesion or focal pathologic process. Soft tissues and spinal canal: No prevertebral fluid or swelling. No visible canal hematoma. Disc levels: Multilevel advanced spondylosis, disc space height loss, and degenerative endplate changes. Advanced facet arthropathy on the left at C3-C4 and C4-C5. Posterior disc osteophyte complexes cause up to mild effacement of thecal sac greatest at C5-C6. No severe spinal canal narrowing. Upper chest: No acute abnormality. Other: Carotid calcification. IMPRESSION: 1. No acute intracranial abnormality. 2. No calvarial or facial fracture. Large left forehead/periorbital hematoma. 3. No acute fracture in the cervical spine. Electronically Signed   By: Minerva Fester M.D.   On: 09/07/2022 19:57    Assessment & Plan:   Abrasion of knee, unspecified laterality, subsequent encounter- Wounds are healing with no signs of infection or complications.  Skin tear of left forearm without complication, subsequent encounter- He was given a Td.  Essential hypertension, benign- His blood pressure is adequately well-controlled.  Other orders -  Td vaccine greater than or equal to 7yo preservative free IM     Follow-up: No follow-ups on file.  Sanda Linger, MD

## 2022-10-04 ENCOUNTER — Other Ambulatory Visit: Payer: Self-pay | Admitting: Internal Medicine

## 2022-10-04 DIAGNOSIS — E1149 Type 2 diabetes mellitus with other diabetic neurological complication: Secondary | ICD-10-CM

## 2022-10-08 ENCOUNTER — Other Ambulatory Visit (HOSPITAL_COMMUNITY): Payer: Self-pay

## 2022-10-14 DIAGNOSIS — K08409 Partial loss of teeth, unspecified cause, unspecified class: Secondary | ICD-10-CM | POA: Diagnosis not present

## 2022-10-14 DIAGNOSIS — Z4889 Encounter for other specified surgical aftercare: Secondary | ICD-10-CM | POA: Diagnosis not present

## 2022-11-08 ENCOUNTER — Other Ambulatory Visit: Payer: Self-pay | Admitting: Cardiology

## 2022-11-11 ENCOUNTER — Encounter (INDEPENDENT_AMBULATORY_CARE_PROVIDER_SITE_OTHER): Payer: Medicare PPO | Admitting: Ophthalmology

## 2022-11-18 ENCOUNTER — Other Ambulatory Visit: Payer: Self-pay

## 2022-11-20 ENCOUNTER — Encounter: Payer: Self-pay | Admitting: Podiatry

## 2022-11-20 ENCOUNTER — Ambulatory Visit: Payer: Medicare PPO | Admitting: Podiatry

## 2022-11-20 DIAGNOSIS — M79674 Pain in right toe(s): Secondary | ICD-10-CM | POA: Diagnosis not present

## 2022-11-20 DIAGNOSIS — E1149 Type 2 diabetes mellitus with other diabetic neurological complication: Secondary | ICD-10-CM

## 2022-11-20 DIAGNOSIS — B351 Tinea unguium: Secondary | ICD-10-CM | POA: Diagnosis not present

## 2022-11-20 DIAGNOSIS — M79675 Pain in left toe(s): Secondary | ICD-10-CM | POA: Diagnosis not present

## 2022-11-20 NOTE — Progress Notes (Signed)
This patient returns to my office for at risk foot care.  This patient requires this care by a professional since this patient will be at risk due to having diabetic neuropathy.   This patient is unable to cut nails himself since the patient cannot reach his nails.These nails are painful walking and wearing shoes.  He presents to the office with his daughter. This patient presents for at risk foot care today.  General Appearance  Alert, conversant and in no acute stress.  Vascular  Dorsalis pedis and posterior tibial  pulses are  weakly palpable  bilaterally.  Capillary return is within normal limits  bilaterally. Temperature is within normal limits  bilaterally.  Neurologic  Senn-Weinstein monofilament wire test within normal limits  bilaterally. Muscle power within normal limits bilaterally.  Nails Thick disfigured discolored nails with subungual debris  from hallux to fifth toes bilaterally. No evidence of bacterial infection or drainage bilaterally.  Orthopedic  No limitations of motion  feet .  No crepitus or effusions noted.  No bony pathology or digital deformities noted.  Skin  normotropic skin with no porokeratosis noted bilaterally.  No signs of infections or ulcers noted.     Onychomycosis  Pain in right toes  Pain in left toes  Consent was obtained for treatment procedures.   Mechanical debridement of nails 1-5  bilaterally performed with a nail nipper.  Filed with dremel without incident.    Return office visit    3 months                 Told patient to return for periodic foot care and evaluation due to potential at risk complications.   Helane Gunther DPM

## 2022-12-04 ENCOUNTER — Encounter (INDEPENDENT_AMBULATORY_CARE_PROVIDER_SITE_OTHER): Payer: Medicare PPO | Admitting: Ophthalmology

## 2022-12-04 DIAGNOSIS — E113293 Type 2 diabetes mellitus with mild nonproliferative diabetic retinopathy without macular edema, bilateral: Secondary | ICD-10-CM | POA: Diagnosis not present

## 2022-12-04 DIAGNOSIS — H33302 Unspecified retinal break, left eye: Secondary | ICD-10-CM | POA: Diagnosis not present

## 2022-12-04 DIAGNOSIS — H43813 Vitreous degeneration, bilateral: Secondary | ICD-10-CM

## 2022-12-04 DIAGNOSIS — Z7984 Long term (current) use of oral hypoglycemic drugs: Secondary | ICD-10-CM

## 2022-12-26 ENCOUNTER — Other Ambulatory Visit (HOSPITAL_COMMUNITY): Payer: Self-pay

## 2022-12-26 DIAGNOSIS — L814 Other melanin hyperpigmentation: Secondary | ICD-10-CM | POA: Diagnosis not present

## 2022-12-26 DIAGNOSIS — Z85828 Personal history of other malignant neoplasm of skin: Secondary | ICD-10-CM | POA: Diagnosis not present

## 2022-12-26 DIAGNOSIS — D225 Melanocytic nevi of trunk: Secondary | ICD-10-CM | POA: Diagnosis not present

## 2022-12-26 DIAGNOSIS — L821 Other seborrheic keratosis: Secondary | ICD-10-CM | POA: Diagnosis not present

## 2022-12-26 DIAGNOSIS — L578 Other skin changes due to chronic exposure to nonionizing radiation: Secondary | ICD-10-CM | POA: Diagnosis not present

## 2022-12-26 DIAGNOSIS — L570 Actinic keratosis: Secondary | ICD-10-CM | POA: Diagnosis not present

## 2023-01-04 ENCOUNTER — Other Ambulatory Visit: Payer: Self-pay | Admitting: Internal Medicine

## 2023-01-04 DIAGNOSIS — E1149 Type 2 diabetes mellitus with other diabetic neurological complication: Secondary | ICD-10-CM

## 2023-01-05 ENCOUNTER — Other Ambulatory Visit: Payer: Self-pay

## 2023-01-05 ENCOUNTER — Other Ambulatory Visit: Payer: Self-pay | Admitting: Internal Medicine

## 2023-01-05 ENCOUNTER — Other Ambulatory Visit: Payer: Self-pay | Admitting: Cardiology

## 2023-01-05 ENCOUNTER — Other Ambulatory Visit (HOSPITAL_COMMUNITY): Payer: Self-pay

## 2023-01-05 DIAGNOSIS — E785 Hyperlipidemia, unspecified: Secondary | ICD-10-CM

## 2023-01-05 DIAGNOSIS — K2100 Gastro-esophageal reflux disease with esophagitis, without bleeding: Secondary | ICD-10-CM

## 2023-01-05 DIAGNOSIS — I10 Essential (primary) hypertension: Secondary | ICD-10-CM

## 2023-01-05 DIAGNOSIS — I2510 Atherosclerotic heart disease of native coronary artery without angina pectoris: Secondary | ICD-10-CM

## 2023-01-05 MED ORDER — IRBESARTAN 150 MG PO TABS
150.0000 mg | ORAL_TABLET | Freq: Every day | ORAL | 0 refills | Status: DC
  Filled 2023-01-05 – 2023-02-11 (×2): qty 90, 90d supply, fill #0

## 2023-01-05 MED ORDER — OMEPRAZOLE 40 MG PO CPDR
40.0000 mg | DELAYED_RELEASE_CAPSULE | Freq: Every day | ORAL | 0 refills | Status: DC
Start: 2023-01-05 — End: 2023-06-16
  Filled 2023-01-05 – 2023-02-11 (×2): qty 90, 90d supply, fill #0

## 2023-01-05 MED ORDER — METOPROLOL TARTRATE 25 MG PO TABS
12.5000 mg | ORAL_TABLET | Freq: Two times a day (BID) | ORAL | 0 refills | Status: DC
Start: 2023-01-05 — End: 2023-05-24
  Filled 2023-01-05 – 2023-02-11 (×2): qty 90, 90d supply, fill #0

## 2023-01-05 MED ORDER — ATORVASTATIN CALCIUM 40 MG PO TABS
40.0000 mg | ORAL_TABLET | Freq: Every day | ORAL | 0 refills | Status: DC
Start: 2023-01-05 — End: 2023-06-16
  Filled 2023-01-05 – 2023-02-11 (×2): qty 90, 90d supply, fill #0

## 2023-01-06 NOTE — Telephone Encounter (Signed)
Pharmacy has been updated in the system.

## 2023-01-06 NOTE — Telephone Encounter (Signed)
Patient's daughter stopped by states that all medications should be going to the [facility] from now, no medications should be sent to the [facility]. She asked if this could be changed in the system.

## 2023-02-11 ENCOUNTER — Other Ambulatory Visit: Payer: Self-pay

## 2023-02-20 ENCOUNTER — Ambulatory Visit: Payer: Medicare PPO | Admitting: Podiatry

## 2023-02-20 ENCOUNTER — Encounter: Payer: Self-pay | Admitting: Podiatry

## 2023-02-20 DIAGNOSIS — B351 Tinea unguium: Secondary | ICD-10-CM | POA: Diagnosis not present

## 2023-02-20 DIAGNOSIS — E1149 Type 2 diabetes mellitus with other diabetic neurological complication: Secondary | ICD-10-CM

## 2023-02-20 DIAGNOSIS — M79674 Pain in right toe(s): Secondary | ICD-10-CM | POA: Diagnosis not present

## 2023-02-20 DIAGNOSIS — M79675 Pain in left toe(s): Secondary | ICD-10-CM

## 2023-02-20 NOTE — Progress Notes (Signed)
This patient returns to my office for at risk foot care.  This patient requires this care by a professional since this patient will be at risk due to having diabetic neuropathy.   This patient is unable to cut nails himself since the patient cannot reach his nails.These nails are painful walking and wearing shoes.  He presents to the office with his daughter. This patient presents for at risk foot care today.  General Appearance  Alert, conversant and in no acute stress.  Vascular  Dorsalis pedis and posterior tibial  pulses are  weakly palpable  bilaterally.  Capillary return is within normal limits  bilaterally. Temperature is within normal limits  bilaterally.  Neurologic  Senn-Weinstein monofilament wire test within normal limits  bilaterally. Muscle power within normal limits bilaterally.  Nails Thick disfigured discolored nails with subungual debris  from hallux to fifth toes bilaterally. No evidence of bacterial infection or drainage bilaterally.  Orthopedic  No limitations of motion  feet .  No crepitus or effusions noted.  No bony pathology or digital deformities noted.  Skin  normotropic skin with no porokeratosis noted bilaterally.  No signs of infections or ulcers noted.     Onychomycosis  Pain in right toes  Pain in left toes  Consent was obtained for treatment procedures.   Mechanical debridement of nails 1-5  bilaterally performed with a nail nipper.  Filed with dremel without incident.    Return office visit    3 months                 Told patient to return for periodic foot care and evaluation due to potential at risk complications.   Gardiner Barefoot DPM

## 2023-05-20 ENCOUNTER — Other Ambulatory Visit: Payer: Self-pay | Admitting: Internal Medicine

## 2023-05-20 ENCOUNTER — Other Ambulatory Visit: Payer: Self-pay

## 2023-05-20 ENCOUNTER — Other Ambulatory Visit: Payer: Self-pay | Admitting: Cardiology

## 2023-05-20 ENCOUNTER — Other Ambulatory Visit (HOSPITAL_COMMUNITY): Payer: Self-pay

## 2023-05-20 DIAGNOSIS — E785 Hyperlipidemia, unspecified: Secondary | ICD-10-CM

## 2023-05-20 DIAGNOSIS — I2510 Atherosclerotic heart disease of native coronary artery without angina pectoris: Secondary | ICD-10-CM

## 2023-05-20 DIAGNOSIS — K2100 Gastro-esophageal reflux disease with esophagitis, without bleeding: Secondary | ICD-10-CM

## 2023-05-20 DIAGNOSIS — I10 Essential (primary) hypertension: Secondary | ICD-10-CM

## 2023-05-20 MED ORDER — IRBESARTAN 150 MG PO TABS
150.0000 mg | ORAL_TABLET | Freq: Every day | ORAL | 0 refills | Status: DC
  Filled 2023-05-20: qty 90, 90d supply, fill #0

## 2023-05-21 ENCOUNTER — Other Ambulatory Visit: Payer: Self-pay

## 2023-05-22 ENCOUNTER — Encounter: Payer: Self-pay | Admitting: Podiatry

## 2023-05-22 ENCOUNTER — Other Ambulatory Visit (HOSPITAL_COMMUNITY): Payer: Self-pay

## 2023-05-22 ENCOUNTER — Ambulatory Visit: Payer: Medicare PPO | Admitting: Podiatry

## 2023-05-22 DIAGNOSIS — E1149 Type 2 diabetes mellitus with other diabetic neurological complication: Secondary | ICD-10-CM

## 2023-05-22 DIAGNOSIS — B351 Tinea unguium: Secondary | ICD-10-CM | POA: Diagnosis not present

## 2023-05-22 DIAGNOSIS — M79675 Pain in left toe(s): Secondary | ICD-10-CM

## 2023-05-22 DIAGNOSIS — M79674 Pain in right toe(s): Secondary | ICD-10-CM | POA: Diagnosis not present

## 2023-05-22 NOTE — Progress Notes (Signed)
This patient returns to my office for at risk foot care.  This patient requires this care by a professional since this patient will be at risk due to having diabetic neuropathy.   This patient is unable to cut nails himself since the patient cannot reach his nails.These nails are painful walking and wearing shoes.  He presents to the office with his daughter. This patient presents for at risk foot care today.  General Appearance  Alert, conversant and in no acute stress.  Vascular  Dorsalis pedis and posterior tibial  pulses are  weakly palpable  bilaterally.  Capillary return is within normal limits  bilaterally. Temperature is within normal limits  bilaterally.  Neurologic  Senn-Weinstein monofilament wire test within normal limits  bilaterally. Muscle power within normal limits bilaterally.  Nails Thick disfigured discolored nails with subungual debris  from hallux to fifth toes bilaterally. No evidence of bacterial infection or drainage bilaterally.  Orthopedic  No limitations of motion  feet .  No crepitus or effusions noted.  No bony pathology or digital deformities noted.  Skin  normotropic skin with no porokeratosis noted bilaterally.  No signs of infections or ulcers noted.     Onychomycosis  Pain in right toes  Pain in left toes  Consent was obtained for treatment procedures.   Mechanical debridement of nails 1-5  bilaterally performed with a nail nipper.  Filed with dremel without incident.    Return office visit    3 months                 Told patient to return for periodic foot care and evaluation due to potential at risk complications.   Gardiner Barefoot DPM

## 2023-05-22 NOTE — Telephone Encounter (Signed)
 Copied from CRM [id number]. Topic: Clinical - Medication Question >> May 22, 2023  1:56 PM Jon Gills C wrote: Reason for CRM: Patient daughter called in wanted to know since patient will not be able to see Dr.Jones until 04/22 can he get a prescription for metoprolol tartrate (LOPRESSOR) 25 MG tablet until they are able to be seen , would like it to be sent on and also be called if able to get that filled       - [facility] [address] [place] [address] Phone: [phone] Fax: [phone] Hours: Mon-Fri 7:30am-6pm; Sat 8:00am-4:30pm

## 2023-05-24 ENCOUNTER — Other Ambulatory Visit: Payer: Self-pay | Admitting: Internal Medicine

## 2023-05-24 DIAGNOSIS — I10 Essential (primary) hypertension: Secondary | ICD-10-CM

## 2023-05-24 DIAGNOSIS — I2510 Atherosclerotic heart disease of native coronary artery without angina pectoris: Secondary | ICD-10-CM

## 2023-05-25 ENCOUNTER — Other Ambulatory Visit: Payer: Self-pay

## 2023-05-25 ENCOUNTER — Other Ambulatory Visit (HOSPITAL_COMMUNITY): Payer: Self-pay

## 2023-05-25 MED ORDER — METOPROLOL TARTRATE 25 MG PO TABS
12.5000 mg | ORAL_TABLET | Freq: Two times a day (BID) | ORAL | 0 refills | Status: DC
Start: 2023-05-25 — End: 2023-08-17
  Filled 2023-05-25 (×2): qty 90, 90d supply, fill #0

## 2023-05-30 ENCOUNTER — Other Ambulatory Visit (HOSPITAL_COMMUNITY): Payer: Self-pay

## 2023-06-16 ENCOUNTER — Ambulatory Visit: Admitting: Internal Medicine

## 2023-06-16 ENCOUNTER — Encounter: Payer: Self-pay | Admitting: Internal Medicine

## 2023-06-16 VITALS — BP 148/76 | HR 67 | Temp 98.0°F | Ht 70.0 in | Wt 170.6 lb

## 2023-06-16 DIAGNOSIS — K2100 Gastro-esophageal reflux disease with esophagitis, without bleeding: Secondary | ICD-10-CM | POA: Diagnosis not present

## 2023-06-16 DIAGNOSIS — N481 Balanitis: Secondary | ICD-10-CM | POA: Insufficient documentation

## 2023-06-16 DIAGNOSIS — I2510 Atherosclerotic heart disease of native coronary artery without angina pectoris: Secondary | ICD-10-CM | POA: Diagnosis not present

## 2023-06-16 DIAGNOSIS — I10 Essential (primary) hypertension: Secondary | ICD-10-CM

## 2023-06-16 DIAGNOSIS — Z23 Encounter for immunization: Secondary | ICD-10-CM

## 2023-06-16 DIAGNOSIS — Z7984 Long term (current) use of oral hypoglycemic drugs: Secondary | ICD-10-CM

## 2023-06-16 DIAGNOSIS — Z0001 Encounter for general adult medical examination with abnormal findings: Secondary | ICD-10-CM

## 2023-06-16 DIAGNOSIS — B356 Tinea cruris: Secondary | ICD-10-CM | POA: Insufficient documentation

## 2023-06-16 DIAGNOSIS — E1149 Type 2 diabetes mellitus with other diabetic neurological complication: Secondary | ICD-10-CM | POA: Diagnosis not present

## 2023-06-16 DIAGNOSIS — L578 Other skin changes due to chronic exposure to nonionizing radiation: Secondary | ICD-10-CM | POA: Insufficient documentation

## 2023-06-16 DIAGNOSIS — E785 Hyperlipidemia, unspecified: Secondary | ICD-10-CM | POA: Diagnosis not present

## 2023-06-16 DIAGNOSIS — D225 Melanocytic nevi of trunk: Secondary | ICD-10-CM | POA: Insufficient documentation

## 2023-06-16 DIAGNOSIS — N1831 Chronic kidney disease, stage 3a: Secondary | ICD-10-CM | POA: Insufficient documentation

## 2023-06-16 DIAGNOSIS — K7581 Nonalcoholic steatohepatitis (NASH): Secondary | ICD-10-CM

## 2023-06-16 DIAGNOSIS — I493 Ventricular premature depolarization: Secondary | ICD-10-CM | POA: Insufficient documentation

## 2023-06-16 DIAGNOSIS — L814 Other melanin hyperpigmentation: Secondary | ICD-10-CM | POA: Insufficient documentation

## 2023-06-16 LAB — CBC WITH DIFFERENTIAL/PLATELET
Basophils Absolute: 0 10*3/uL (ref 0.0–0.1)
Basophils Relative: 0.9 % (ref 0.0–3.0)
Eosinophils Absolute: 0.2 10*3/uL (ref 0.0–0.7)
Eosinophils Relative: 4.9 % (ref 0.0–5.0)
HCT: 40.5 % (ref 39.0–52.0)
Hemoglobin: 13.2 g/dL (ref 13.0–17.0)
Lymphocytes Relative: 21.1 % (ref 12.0–46.0)
Lymphs Abs: 1 10*3/uL (ref 0.7–4.0)
MCHC: 32.7 g/dL (ref 30.0–36.0)
MCV: 96.1 fl (ref 78.0–100.0)
Monocytes Absolute: 0.4 10*3/uL (ref 0.1–1.0)
Monocytes Relative: 8.8 % (ref 3.0–12.0)
Neutro Abs: 3 10*3/uL (ref 1.4–7.7)
Neutrophils Relative %: 64.3 % (ref 43.0–77.0)
Platelets: 189 10*3/uL (ref 150.0–400.0)
RBC: 4.22 Mil/uL (ref 4.22–5.81)
RDW: 13.9 % (ref 11.5–15.5)
WBC: 4.7 10*3/uL (ref 4.0–10.5)

## 2023-06-16 LAB — URINALYSIS, ROUTINE W REFLEX MICROSCOPIC
Bilirubin Urine: NEGATIVE
Hgb urine dipstick: NEGATIVE
Ketones, ur: NEGATIVE
Leukocytes,Ua: NEGATIVE
Nitrite: NEGATIVE
RBC / HPF: NONE SEEN (ref 0–?)
Specific Gravity, Urine: 1.01 (ref 1.000–1.030)
Total Protein, Urine: NEGATIVE
Urine Glucose: NEGATIVE
Urobilinogen, UA: 2 — AB (ref 0.0–1.0)
pH: 7.5 (ref 5.0–8.0)

## 2023-06-16 LAB — BASIC METABOLIC PANEL WITH GFR
BUN: 17 mg/dL (ref 6–23)
CO2: 34 meq/L — ABNORMAL HIGH (ref 19–32)
Calcium: 9.6 mg/dL (ref 8.4–10.5)
Chloride: 97 meq/L (ref 96–112)
Creatinine, Ser: 0.93 mg/dL (ref 0.40–1.50)
GFR: 70.97 mL/min (ref >60.00)
Glucose, Bld: 111 mg/dL — ABNORMAL HIGH (ref 70–99)
Potassium: 4.6 meq/L (ref 3.5–5.1)
Sodium: 139 meq/L (ref 135–145)

## 2023-06-16 LAB — HEPATIC FUNCTION PANEL
ALT: 26 U/L (ref 0–53)
AST: 29 U/L (ref 0–37)
Albumin: 4.2 g/dL (ref 3.5–5.2)
Alkaline Phosphatase: 78 U/L (ref 39–117)
Bilirubin, Direct: 0.1 mg/dL (ref 0.0–0.3)
Total Bilirubin: 0.6 mg/dL (ref 0.2–1.2)
Total Protein: 7.5 g/dL (ref 6.0–8.3)

## 2023-06-16 LAB — HEMOGLOBIN A1C: Hgb A1c MFr Bld: 6.4 % (ref 4.6–6.5)

## 2023-06-16 LAB — LIPID PANEL
Cholesterol: 155 mg/dL (ref 0–200)
HDL: 56.7 mg/dL (ref >39.00)
LDL Cholesterol: 79 mg/dL (ref 0–99)
NonHDL: 97.98
Total CHOL/HDL Ratio: 3
Triglycerides: 95 mg/dL (ref 0.0–149.0)
VLDL: 19 mg/dL (ref 0.0–40.0)

## 2023-06-16 LAB — TSH: TSH: 2.1 u[IU]/mL (ref 0.35–5.50)

## 2023-06-16 LAB — PROTIME-INR
INR: 1.2 ratio — ABNORMAL HIGH (ref 0.8–1.0)
Prothrombin Time: 13.1 s (ref 9.6–13.1)

## 2023-06-16 LAB — MICROALBUMIN / CREATININE URINE RATIO
Creatinine,U: 66.4 mg/dL
Microalb Creat Ratio: 51 mg/g — ABNORMAL HIGH (ref 0.0–30.0)
Microalb, Ur: 3.4 mg/dL — ABNORMAL HIGH (ref 0.0–1.9)

## 2023-06-16 MED ORDER — OMEPRAZOLE 40 MG PO CPDR
40.0000 mg | DELAYED_RELEASE_CAPSULE | Freq: Every day | ORAL | 1 refills | Status: DC
  Filled 2023-06-16: qty 90, 90d supply, fill #0
  Filled 2023-09-08: qty 90, 90d supply, fill #1

## 2023-06-16 MED ORDER — ATORVASTATIN CALCIUM 40 MG PO TABS
40.0000 mg | ORAL_TABLET | Freq: Every day | ORAL | 1 refills | Status: DC
  Filled 2023-06-16: qty 90, 90d supply, fill #0
  Filled 2023-09-08: qty 90, 90d supply, fill #1

## 2023-06-16 MED ORDER — METFORMIN HCL ER 500 MG PO TB24
500.0000 mg | ORAL_TABLET | Freq: Every day | ORAL | 1 refills | Status: DC
  Filled 2023-06-16: qty 90, 90d supply, fill #0
  Filled 2023-09-08: qty 90, 90d supply, fill #1

## 2023-06-16 MED ORDER — FLUCONAZOLE 150 MG PO TABS: 150.0000 mg | ORAL_TABLET | ORAL | 0 refills | Status: DC

## 2023-06-16 MED ORDER — SHINGRIX 50 MCG/0.5ML IM SUSR
0.5000 mL | Freq: Once | INTRAMUSCULAR | 1 refills | Status: AC
Start: 2023-06-16 — End: 2023-06-16

## 2023-06-16 NOTE — Progress Notes (Signed)
 Subjective:  Patient ID: Manuel [name], male    DOB: 10-21-1928  Age: 89 y.o. MRN: [id number]  CC: Annual Exam, Hypertension, Hyperlipidemia, and Diabetes   HPI Manuel PANGILINAN presents for a CPX and f/up ----  Discussed the use of AI scribe software for clinical note transcription with the patient, who gave verbal consent to proceed.  History of Present Illness   [name] is a 89 year old male who presents for a routine follow-up and evaluation of skin concerns.  He is experiencing skin issues, including bleeding and bruising, which have been noticed by his daughter. A previous head-to-toe examination was conducted, but he lacks male support at home for further assistance.  He reports a slight weight loss, which he attributes to intentionally eating less, aiming to maintain his weight around 165 pounds. He no longer cleans his place, indicating a decrease in physical activity.  He received a flu shot last year and has not experienced any significant symptoms since then. He feels good while walking, without any chest pain or shortness of breath. He carries nitroglycerin  but has rarely used it, possibly only once or twice since it was prescribed.  He is not currently taking any antibiotics and has not had his blood sugar checked in the past year. No symptoms related to blood sugar issues.  He undergoes regular foot checks at a podiatry clinic every three months.        Outpatient Medications Prior to Visit  Medication Sig Dispense Refill   aspirin EC 81 MG EC tablet Take 1 tablet (81 mg total) by mouth daily.     irbesartan  (AVAPRO ) 150 MG tablet Take 1 tablet (150 mg total) by mouth daily. 90 tablet 0   metoprolol  tartrate (LOPRESSOR ) 25 MG tablet Take 0.5 tablets (12.5 mg total) by mouth 2 (two) times daily. 90 tablet 0   Multiple Vitamin (MULTIVITAMIN) tablet Take 1 tablet by mouth daily.     nitroGLYCERIN  (NITROSTAT ) 0.4 MG SL tablet Place 1 tablet (0.4 mg total) under the  tongue every 5 (five) minutes as needed for chest pain. 25 tablet 3   atorvastatin  (LIPITOR) 40 MG tablet Take 1 tablet (40 mg total) by mouth daily. 90 tablet 0   irbesartan  (AVAPRO ) 150 MG tablet TAKE 1 TABLET BY MOUTH EVERY DAY 90 tablet 1   metFORMIN  (GLUCOPHAGE ) 500 MG tablet Take 2 tablets (1,000 mg total) by mouth 2 (two) times daily with a meal. 360 tablet 1   metFORMIN  (GLUCOPHAGE ) 500 MG tablet TAKE 2 TABLETS BY MOUTH TWICE A DAY WITH MEALS 360 tablet 0   omeprazole  (PRILOSEC) 40 MG capsule Take 1 capsule (40 mg total) by mouth daily. 90 capsule 0   amoxicillin -clavulanate (AUGMENTIN ) 875-125 MG tablet Take 1 tablet by mouth every 12 (twelve) hours. 14 tablet 0   Blood Glucose Monitoring Suppl (ONETOUCH VERIO FLEX SYSTEM) w/Device KIT 1 Device by Does not apply route daily. Use to check blood sugars daily Dx E11.9 1 kit 0   glucose blood (ONETOUCH VERIO) test strip USE TO CHECK BLOOD SUGARS TWICE A DAY 100 each 3   OneTouch Delica Lancets 33G MISC Use to help check blood sugars twice a day Dx E11.9 100 each 3   No facility-administered medications prior to visit.    ROS Review of Systems  Constitutional:  Positive for unexpected weight change (wt loss). Negative for appetite change, chills, diaphoresis and fatigue.  HENT: Negative.    Eyes: Negative.  Negative for  visual disturbance.  Respiratory: Negative.  Negative for cough, chest tightness, shortness of breath and wheezing.   Cardiovascular:  Negative for chest pain, palpitations and leg swelling.  Gastrointestinal: Negative.  Negative for abdominal pain, constipation, diarrhea, nausea and vomiting.  Endocrine: Negative.   Genitourinary: Negative.  Negative for difficulty urinating, dysuria and genital sores.  Musculoskeletal:  Positive for gait problem. Negative for back pain.  Neurological:  Negative for dizziness and weakness.  Hematological:  Negative for adenopathy. Bruises/bleeds easily.  Psychiatric/Behavioral:   Positive for confusion and decreased concentration.     Objective:  BP (!) 148/76 (BP Location: Left Arm, Patient Position: Sitting, Cuff Size: Normal)   Pulse 67   Temp 98 F (36.7 C) (Oral)   Ht 5\' 10"  (1.778 m)   Wt 170 lb 9.6 oz (77.4 kg)   SpO2 92%   BMI 24.48 kg/m   BP Readings from Last 3 Encounters:  06/16/23 (!) 148/76  09/18/22 (!) 148/76  09/07/22 (!) 139/92    Wt Readings from Last 3 Encounters:  06/16/23 170 lb 9.6 oz (77.4 kg)  09/18/22 172 lb (78 kg)  08/18/22 175 lb (79.4 kg)    Physical Exam Vitals reviewed.  Constitutional:      Appearance: Normal appearance.  HENT:     Nose: Nose normal.     Mouth/Throat:     Mouth: Mucous membranes are moist.  Eyes:     General: No scleral icterus.    Conjunctiva/sclera: Conjunctivae normal.  Cardiovascular:     Rate and Rhythm: Normal rate and regular rhythm. Frequent Extrasystoles are present.    Pulses: Normal pulses.     Heart sounds: No murmur heard.    No friction rub. No gallop.     Comments: EKG- SR with frequent PVC's, 62 bpm (new) LAD NS T wave changes No LVH or Q waves     Pulmonary:     Effort: Pulmonary effort is normal.     Breath sounds: No stridor. No wheezing, rhonchi or rales.  Abdominal:     General: Abdomen is protuberant. Bowel sounds are normal. There is no distension or abdominal bruit. There are no signs of injury.     Palpations: Abdomen is soft. There is no hepatomegaly, splenomegaly or mass.     Tenderness: There is no abdominal tenderness. There is no guarding or rebound.     Hernia: A hernia is present. Hernia is present in the ventral area. There is no hernia in the left inguinal area or right inguinal area.  Genitourinary:    Pubic Area: Rash present.     Penis: Uncircumcised. Erythema present. No phimosis, paraphimosis, tenderness, discharge, swelling or lesions.      Testes: Normal.     Epididymis:     Right: Normal.     Left: Normal.     Prostate: Normal. Not  enlarged, not tender and no nodules present.     Rectum: Normal. Guaiac result negative. No mass, tenderness, anal fissure, external hemorrhoid or internal hemorrhoid. Normal anal tone.    Musculoskeletal:        General: No swelling or deformity.     Cervical back: Neck supple.     Right lower leg: Edema (trace pitting) present.     Left lower leg: Edema (trace pittinig) present.  Lymphadenopathy:     Cervical: No cervical adenopathy.     Lower Body: No right inguinal adenopathy. No left inguinal adenopathy.  Skin:    General: Skin is warm.  Coloration: Skin is not jaundiced or pale.     Findings: Erythema and rash present.  Neurological:     General: No focal deficit present.     Mental Status: He is alert. Mental status is at baseline.  Psychiatric:        Mood and Affect: Mood normal.        Behavior: Behavior normal.     Lab Results  Component Value Date   WBC 4.7 06/16/2023   HGB 13.2 06/16/2023   HCT 40.5 06/16/2023   PLT 189.0 06/16/2023   GLUCOSE 111 (H) 06/16/2023   CHOL 155 06/16/2023   TRIG 95.0 06/16/2023   HDL 56.70 06/16/2023   LDLCALC 79 06/16/2023   ALT 26 06/16/2023   AST 29 06/16/2023   NA 139 06/16/2023   K 4.6 06/16/2023   CL 97 06/16/2023   CREATININE 0.93 06/16/2023   BUN 17 06/16/2023   CO2 34 (H) 06/16/2023   TSH 2.10 06/16/2023   INR 1.2 (H) 06/16/2023   HGBA1C 6.4 06/16/2023   MICROALBUR 3.4 (H) 06/16/2023    DG Hips Bilat W or Wo Pelvis 3-4 Views Result Date: 09/07/2022 CLINICAL DATA:  Fall, hip pain . EXAM: DG HIP (WITH OR WITHOUT PELVIS) 3-4V BILAT COMPARISON:  None Available. FINDINGS: There is no evidence of hip fracture or dislocation. There is no evidence of arthropathy or other focal bone abnormality. Vascular calcifications noted IMPRESSION: 1. Negative. Electronically Signed   By: Worthy Heads M.D.   On: 09/07/2022 20:15   DG Knee Complete 4 Views Right Result Date: 09/07/2022 CLINICAL DATA:  Fall, right knee pain EXAM:  RIGHT KNEE - COMPLETE 4+ VIEW COMPARISON:  None Available. FINDINGS: Normal alignment. No acute fracture or dislocation. Mild medial compartment degenerative arthritis. Small right knee effusion. Vascular calcifications are noted. Mild degenerative enthesopathy seen involving the insertion of the quadriceps tendon upon the patella. Mild prepatellar soft tissue swelling. IMPRESSION: 1. Mild medial compartment degenerative arthritis. 2. Small right knee effusion. Mild prepatellar soft tissue swelling. Electronically Signed   By: Worthy Heads M.D.   On: 09/07/2022 20:14   DG Knee Complete 4 Views Left Result Date: 09/07/2022 CLINICAL DATA:  Fall EXAM: LEFT KNEE - COMPLETE 4+ VIEW COMPARISON:  None Available. FINDINGS: Normal alignment. No acute fracture or dislocation. Mild degenerative changes are noted within the medial compartment. Small left knee effusion. Vascular calcifications are noted. Degenerative enthesopathy is seen involving the insertion the quadriceps tendon upon the patella. IMPRESSION: 1. Mild degenerative change. Small left knee effusion. Electronically Signed   By: Worthy Heads M.D.   On: 09/07/2022 20:13   DG Hand Complete Left Result Date: 09/07/2022 CLINICAL DATA:  Fall, left hand pain EXAM: LEFT HAND - COMPLETE 3+ VIEW COMPARISON:  None Available. FINDINGS: Normal alignment. No acute fracture or dislocation. Advanced degenerative changes are noted involving the interphalangeal joint of the thumb as well as the PIP and DIP joints of the second through fifth digits. Mild degenerative changes are seen within the carpus. Soft tissues are unremarkable. IMPRESSION: 1. Degenerative changes. No acute fracture or dislocation. Electronically Signed   By: Worthy Heads M.D.   On: 09/07/2022 20:12   CT Head Wo Contrast Result Date: 09/07/2022 CLINICAL DATA:  Blunt facial trauma from fall EXAM: CT HEAD WITHOUT CONTRAST CT MAXILLOFACIAL WITHOUT CONTRAST CT CERVICAL SPINE WITHOUT CONTRAST  TECHNIQUE: Multidetector CT imaging of the head, cervical spine, and maxillofacial structures were performed using the standard protocol without intravenous contrast. Multiplanar CT image  reconstructions of the cervical spine and maxillofacial structures were also generated. RADIATION DOSE REDUCTION: This exam was performed according to the departmental dose-optimization program which includes automated exposure control, adjustment of the mA and/or kV according to patient size and/or use of iterative reconstruction technique. COMPARISON:  None Available. FINDINGS: CT HEAD FINDINGS Brain: No intracranial hemorrhage, mass effect, or evidence of acute infarct. No hydrocephalus. No extra-axial fluid collection. Generalized cerebral atrophy. Ill-defined hypoattenuation within the cerebral white matter is nonspecific but consistent with chronic small vessel ischemic disease. Chronic right cerebellar infarct. Prominent CSF space or arachnoid cyst in the anterior left temporal fossa. Vascular: No hyperdense vessel. Intracranial arterial calcification. Skull: No fracture or focal lesion. Left periorbital/forehead hematoma Other: None. CT MAXILLOFACIAL FINDINGS Osseous: No fracture or mandibular dislocation. No destructive process. Orbits: Postoperative changes both globes. Globes are intact. No postseptal hematoma. Sinuses: Mucosal thickening in the ethmoid air cells and left frontal sinus. Leftward nasal septal deviation. Soft tissues: Negative. CT CERVICAL SPINE FINDINGS Alignment: No evidence of traumatic malalignment. Skull base and vertebrae: No acute fracture. No primary bone lesion or focal pathologic process. Soft tissues and spinal canal: No prevertebral fluid or swelling. No visible canal hematoma. Disc levels: Multilevel advanced spondylosis, disc space height loss, and degenerative endplate changes. Advanced facet arthropathy on the left at C3-C4 and C4-C5. Posterior disc osteophyte complexes cause up to mild  effacement of thecal sac greatest at C5-C6. No severe spinal canal narrowing. Upper chest: No acute abnormality. Other: Carotid calcification. IMPRESSION: 1. No acute intracranial abnormality. 2. No calvarial or facial fracture. Large left forehead/periorbital hematoma. 3. No acute fracture in the cervical spine. Electronically Signed   By: Rozell Cornet M.D.   On: 09/07/2022 19:57   CT Maxillofacial WO CM Result Date: 09/07/2022 CLINICAL DATA:  Blunt facial trauma from fall EXAM: CT HEAD WITHOUT CONTRAST CT MAXILLOFACIAL WITHOUT CONTRAST CT CERVICAL SPINE WITHOUT CONTRAST TECHNIQUE: Multidetector CT imaging of the head, cervical spine, and maxillofacial structures were performed using the standard protocol without intravenous contrast. Multiplanar CT image reconstructions of the cervical spine and maxillofacial structures were also generated. RADIATION DOSE REDUCTION: This exam was performed according to the departmental dose-optimization program which includes automated exposure control, adjustment of the mA and/or kV according to patient size and/or use of iterative reconstruction technique. COMPARISON:  None Available. FINDINGS: CT HEAD FINDINGS Brain: No intracranial hemorrhage, mass effect, or evidence of acute infarct. No hydrocephalus. No extra-axial fluid collection. Generalized cerebral atrophy. Ill-defined hypoattenuation within the cerebral white matter is nonspecific but consistent with chronic small vessel ischemic disease. Chronic right cerebellar infarct. Prominent CSF space or arachnoid cyst in the anterior left temporal fossa. Vascular: No hyperdense vessel. Intracranial arterial calcification. Skull: No fracture or focal lesion. Left periorbital/forehead hematoma Other: None. CT MAXILLOFACIAL FINDINGS Osseous: No fracture or mandibular dislocation. No destructive process. Orbits: Postoperative changes both globes. Globes are intact. No postseptal hematoma. Sinuses: Mucosal thickening in the  ethmoid air cells and left frontal sinus. Leftward nasal septal deviation. Soft tissues: Negative. CT CERVICAL SPINE FINDINGS Alignment: No evidence of traumatic malalignment. Skull base and vertebrae: No acute fracture. No primary bone lesion or focal pathologic process. Soft tissues and spinal canal: No prevertebral fluid or swelling. No visible canal hematoma. Disc levels: Multilevel advanced spondylosis, disc space height loss, and degenerative endplate changes. Advanced facet arthropathy on the left at C3-C4 and C4-C5. Posterior disc osteophyte complexes cause up to mild effacement of thecal sac greatest at C5-C6. No severe spinal canal narrowing. Upper  chest: No acute abnormality. Other: Carotid calcification. IMPRESSION: 1. No acute intracranial abnormality. 2. No calvarial or facial fracture. Large left forehead/periorbital hematoma. 3. No acute fracture in the cervical spine. Electronically Signed   By: Rozell Cornet M.D.   On: 09/07/2022 19:57   CT Cervical Spine Wo Contrast Result Date: 09/07/2022 CLINICAL DATA:  Blunt facial trauma from fall EXAM: CT HEAD WITHOUT CONTRAST CT MAXILLOFACIAL WITHOUT CONTRAST CT CERVICAL SPINE WITHOUT CONTRAST TECHNIQUE: Multidetector CT imaging of the head, cervical spine, and maxillofacial structures were performed using the standard protocol without intravenous contrast. Multiplanar CT image reconstructions of the cervical spine and maxillofacial structures were also generated. RADIATION DOSE REDUCTION: This exam was performed according to the departmental dose-optimization program which includes automated exposure control, adjustment of the mA and/or kV according to patient size and/or use of iterative reconstruction technique. COMPARISON:  None Available. FINDINGS: CT HEAD FINDINGS Brain: No intracranial hemorrhage, mass effect, or evidence of acute infarct. No hydrocephalus. No extra-axial fluid collection. Generalized cerebral atrophy. Ill-defined hypoattenuation  within the cerebral white matter is nonspecific but consistent with chronic small vessel ischemic disease. Chronic right cerebellar infarct. Prominent CSF space or arachnoid cyst in the anterior left temporal fossa. Vascular: No hyperdense vessel. Intracranial arterial calcification. Skull: No fracture or focal lesion. Left periorbital/forehead hematoma Other: None. CT MAXILLOFACIAL FINDINGS Osseous: No fracture or mandibular dislocation. No destructive process. Orbits: Postoperative changes both globes. Globes are intact. No postseptal hematoma. Sinuses: Mucosal thickening in the ethmoid air cells and left frontal sinus. Leftward nasal septal deviation. Soft tissues: Negative. CT CERVICAL SPINE FINDINGS Alignment: No evidence of traumatic malalignment. Skull base and vertebrae: No acute fracture. No primary bone lesion or focal pathologic process. Soft tissues and spinal canal: No prevertebral fluid or swelling. No visible canal hematoma. Disc levels: Multilevel advanced spondylosis, disc space height loss, and degenerative endplate changes. Advanced facet arthropathy on the left at C3-C4 and C4-C5. Posterior disc osteophyte complexes cause up to mild effacement of thecal sac greatest at C5-C6. No severe spinal canal narrowing. Upper chest: No acute abnormality. Other: Carotid calcification. IMPRESSION: 1. No acute intracranial abnormality. 2. No calvarial or facial fracture. Large left forehead/periorbital hematoma. 3. No acute fracture in the cervical spine. Electronically Signed   By: Rozell Cornet M.D.   On: 09/07/2022 19:57    Assessment & Plan:  Need for prophylactic vaccination and inoculation against varicella -     Shingrix ; Inject 0.5 mLs into the muscle once for 1 dose.  Dispense: 0.5 mL; Refill: 1  Hypertension, unspecified type- BP is well controlled. -     EKG 12-Lead -     TSH; Future -     Urinalysis, Routine w reflex microscopic; Future -     CBC with Differential/Platelet;  Future  Hyperlipidemia with target LDL less than 100- LDL goal achieved. Doing well on the statin  -     Lipid panel; Future -     TSH; Future -     Hepatic function panel; Future -     Atorvastatin  Calcium ; Take 1 tablet (40 mg total) by mouth daily.  Dispense: 90 tablet; Refill: 1  Encounter for general adult medical examination with abnormal findings- Exam completed, labs reviewed, vaccines reviewed and updated, no cancer screenings indicated, pt ed material was given.   Gastroesophageal reflux disease with esophagitis without hemorrhage -     CBC with Differential/Platelet; Future  Atherosclerosis of native coronary artery of native heart without angina pectoris -  Atorvastatin  Calcium ; Take 1 tablet (40 mg total) by mouth daily.  Dispense: 90 tablet; Refill: 1  Nonalcoholic steatohepatitis (NASH) -     Hepatic function panel; Future -     Protime-INR; Future  Diabetes mellitus type 2 with neurological manifestations (HCC)- Blood sugar is well controlled. -     Urinalysis, Routine w reflex microscopic; Future -     Hemoglobin A1c; Future -     Microalbumin / creatinine urine ratio; Future -     Basic metabolic panel with GFR; Future -     HM Diabetes Foot Exam -     metFORMIN  HCl ER; Take 1 tablet (500 mg total) by mouth daily with breakfast.  Dispense: 90 tablet; Refill: 1 -     AMB Referral [facility]  Tinea cruris -     Fluconazole ; Take 1 tablet (150 mg total) by mouth once a week.  Dispense: 4 tablet; Refill: 0  Balanitis circinata -     Fluconazole ; Take 1 tablet (150 mg total) by mouth once a week.  Dispense: 4 tablet; Refill: 0  Stage 3a chronic kidney disease (HCC)- Will avoid nephrotoxic agents  -     AMB Referral [facility] Care Management  Asymptomatic PVCs  Gastroesophageal reflux disease with esophagitis -     Omeprazole ; Take 1 capsule (40 mg total) by mouth daily.  Dispense: 90 capsule; Refill: 1     Follow-up: Return in about 6 months (around  12/16/2023).  Sandra Crouch, MD

## 2023-06-16 NOTE — Patient Instructions (Signed)
 Health Maintenance, Male  Adopting a healthy lifestyle and getting preventive care are important in promoting health and wellness. Ask your health care provider about:  The right schedule for you to have regular tests and exams.  Things you can do on your own to prevent diseases and keep yourself healthy.  What should I know about diet, weight, and exercise?  Eat a healthy diet    Eat a diet that includes plenty of vegetables, fruits, low-fat dairy products, and lean protein.  Do not eat a lot of foods that are high in solid fats, added sugars, or sodium.  Maintain a healthy weight  Body mass index (BMI) is a measurement that can be used to identify possible weight problems. It estimates body fat based on height and weight. Your health care provider can help determine your BMI and help you achieve or maintain a healthy weight.  Get regular exercise  Get regular exercise. This is one of the most important things you can do for your health. Most adults should:  Exercise for at least 150 minutes each week. The exercise should increase your heart rate and make you sweat (moderate-intensity exercise).  Do strengthening exercises at least twice a week. This is in addition to the moderate-intensity exercise.  Spend less time sitting. Even light physical activity can be beneficial.  Watch cholesterol and blood lipids  Have your blood tested for lipids and cholesterol at 88 years of age, then have this test every 5 years.  You may need to have your cholesterol levels checked more often if:  Your lipid or cholesterol levels are high.  You are older than 88 years of age.  You are at high risk for heart disease.  What should I know about cancer screening?  Many types of cancers can be detected early and may often be prevented. Depending on your health history and family history, you may need to have cancer screening at various ages. This may include screening for:  Colorectal cancer.  Prostate cancer.  Skin cancer.  Lung  cancer.  What should I know about heart disease, diabetes, and high blood pressure?  Blood pressure and heart disease  High blood pressure causes heart disease and increases the risk of stroke. This is more likely to develop in people who have high blood pressure readings or are overweight.  Talk with your health care provider about your target blood pressure readings.  Have your blood pressure checked:  Every 3-5 years if you are 9-95 years of age.  Every year if you are 85 years old or older.  If you are between the ages of 29 and 29 and are a current or former smoker, ask your health care provider if you should have a one-time screening for abdominal aortic aneurysm (AAA).  Diabetes  Have regular diabetes screenings. This checks your fasting blood sugar level. Have the screening done:  Once every three years after age 23 if you are at a normal weight and have a low risk for diabetes.  More often and at a younger age if you are overweight or have a high risk for diabetes.  What should I know about preventing infection?  Hepatitis B  If you have a higher risk for hepatitis B, you should be screened for this virus. Talk with your health care provider to find out if you are at risk for hepatitis B infection.  Hepatitis C  Blood testing is recommended for:  Everyone born from [birth year] through 1963.  Anyone  with known risk factors for hepatitis C.  Sexually transmitted infections (STIs)  You should be screened each year for STIs, including gonorrhea and chlamydia, if:  You are sexually active and are younger than 88 years of age.  You are older than 88 years of age and your health care provider tells you that you are at risk for this type of infection.  Your sexual activity has changed since you were last screened, and you are at increased risk for chlamydia or gonorrhea. Ask your health care provider if you are at risk.  Ask your health care provider about whether you are at high risk for HIV. Your health care provider  may recommend a prescription medicine to help prevent HIV infection. If you choose to take medicine to prevent HIV, you should first get tested for HIV. You should then be tested every 3 months for as long as you are taking the medicine.  Follow these instructions at home:  Alcohol use  Do not drink alcohol if your health care provider tells you not to drink.  If you drink alcohol:  Limit how much you have to 0-2 drinks a day.  Know how much alcohol is in your drink. In the [place] one drink equals one 12 oz bottle of beer (355 mL), one 5 oz glass of wine (148 mL), or one 1 oz glass of hard liquor (44 mL).  Lifestyle  Do not use any products that contain nicotine or tobacco. These products include cigarettes, chewing tobacco, and vaping devices, such as e-cigarettes. If you need help quitting, ask your health care provider.  Do not use street drugs.  Do not share needles.  Ask your health care provider for help if you need support or information about quitting drugs.  General instructions  Schedule regular health, dental, and eye exams.  Stay current with your vaccines.  Tell your health care provider if:  You often feel depressed.  You have ever been abused or do not feel safe at home.  Summary  Adopting a healthy lifestyle and getting preventive care are important in promoting health and wellness.  Follow your health care provider's instructions about healthy diet, exercising, and getting tested or screened for diseases.  Follow your health care provider's instructions on monitoring your cholesterol and blood pressure.  This information is not intended to replace advice given to you by your health care provider. Make sure you discuss any questions you have with your health care provider.  Document Revised: 07/02/2020 Document Reviewed: 07/02/2020  Elsevier Patient Education  2024 [employer].

## 2023-06-17 ENCOUNTER — Other Ambulatory Visit (HOSPITAL_COMMUNITY): Payer: Self-pay

## 2023-06-17 ENCOUNTER — Other Ambulatory Visit: Payer: Self-pay

## 2023-06-22 ENCOUNTER — Telehealth: Payer: Self-pay | Admitting: *Deleted

## 2023-06-22 NOTE — Progress Notes (Unsigned)
 Care Guide Pharmacy Note  06/22/2023 Name: [name] MRN: [id number] DOB: 1928-09-30  Referred By: Arcadio Knuckles, MD Reason for referral: Complex Care Management and Call Attempt #1 (Outreach to schedule referral with pharmacist )   Manuel [name] is a 89 y.o. year old male who is a primary care patient of Arcadio Knuckles, MD.  [name] was referred to the pharmacist for assistance related to: DMII  An unsuccessful telephone outreach was attempted today to contact the patient who was referred to the pharmacy team for assistance with medication management. Additional attempts will be made to contact the patient.  [name], CMA [place]  [facility], [facility] Guide Direct Dial: [phone]  Fax: [phone] Website: [email or web address]

## 2023-06-24 NOTE — Progress Notes (Signed)
 Care Guide Pharmacy Note  06/24/2023 Name: ESPIRIDION CLAEYS MRN: [id number] DOB: March 23, 1928  Referred By: Arcadio Knuckles, MD Reason for referral: Complex Care Management and Call Attempt #1 (Outreach to schedule referral with pharmacist )   Manuel [name] is a 89 y.o. year old male who is a primary care patient of Arcadio Knuckles, MD.  [name] was referred to the pharmacist for assistance related to: DMII  Successful contact was made with the patient to discuss pharmacy services including being ready for the pharmacist to call at least 5 minutes before the scheduled appointment time and to have medication bottles and any blood pressure readings ready for review. The patient agreed to meet with the pharmacist via telephone visit on 07/07/2023  [name], CMA   [facility], [facility] Guide Direct Dial: [phone]  Fax: [phone] Website: [email or web address]

## 2023-06-24 NOTE — Progress Notes (Signed)
 Care Guide Pharmacy Note  06/24/2023 Name: Manuel [name] MRN: [id number] DOB: Apr 19, 1928  Referred By: Arcadio Knuckles, MD Reason for referral: Complex Care Management and Call Attempt #1 (Outreach to schedule referral with pharmacist )   [name] is a 89 y.o. year old male who is a primary care patient of Arcadio Knuckles, MD.  [name] was referred to the pharmacist for assistance related to: DMII  A second unsuccessful telephone outreach was attempted today to contact the patient who was referred to the pharmacy team for assistance with medication management. Additional attempts will be made to contact the patient.  [name], CMA [place]  [facility], [facility] Guide Direct Dial: [phone]  Fax: [phone] Website: [email or web address]

## 2023-06-26 ENCOUNTER — Ambulatory Visit: Payer: Medicare PPO | Attending: Cardiology | Admitting: Cardiology

## 2023-06-26 VITALS — BP 130/70 | HR 68 | Ht 70.0 in | Wt 171.0 lb

## 2023-06-26 DIAGNOSIS — I10 Essential (primary) hypertension: Secondary | ICD-10-CM

## 2023-06-26 DIAGNOSIS — I2510 Atherosclerotic heart disease of native coronary artery without angina pectoris: Secondary | ICD-10-CM

## 2023-06-26 DIAGNOSIS — E785 Hyperlipidemia, unspecified: Secondary | ICD-10-CM | POA: Diagnosis not present

## 2023-06-26 NOTE — Patient Instructions (Signed)
 Medication Instructions:   Your physician recommends that you continue on your current medications as directed. Please refer to the Current Medication list given to you today.  *If you need a refill on your cardiac medications before your next appointment, please call your pharmacy*    Follow-Up: At [facility], you and your health needs are our priority.  As part of our continuing mission to provide you with exceptional heart care, our providers are all part of one team.  This team includes your primary Cardiologist (physician) and Advanced Practice Providers or APPs (Physician Assistants and Nurse Practitioners) who all work together to provide you with the care you need, when you need it.  Your next appointment:   1 year(s)  Provider:   Dorothye Gathers, MD

## 2023-06-26 NOTE — Progress Notes (Signed)
  Cardiology Office Note:  .   Date:  06/26/2023  ID:  [name], DOB Sep 20, 1928, MRN [id number] PCP: Arcadio Knuckles, MD  [facility] Providers Cardiologist:  Dorothye Gathers, MD    History of Present Illness: .   Manuel [name] is a 89 y.o. male Discussed the use of AI scribe software for clinical note transcription with the patient, who gave verbal consent to proceed.  History of Present Illness Manuel [name] is a 89 year old male with coronary artery disease who presents for follow-up.  He has a history of coronary artery disease, with a myocardial infarction in January 2011. A left heart catheterization at that time revealed a 99% blockage in the midcircumflex artery, treated with a 2.5 by 16 mm drug-eluting stent. He has no current chest pain and has been stable since the stent placement.  His medical history includes diabetes, hypertension, and hyperlipidemia. His blood pressure is well-controlled, and his cholesterol levels are stable, with an LDL of 79 at the last check. He takes aspirin 81 mg daily, atorvastatin  40 mg daily, irbesartan  150 mg daily, and metoprolol  12.5 mg twice daily.  He has nonalcoholic steatohepatitis with chronically elevated ALT and AST levels. His daughter has a similar liver profile.  He experiences vertigo but has not had any recent episodes.  He lives with his daughter in [place], having moved from [place], [place] , and originally from [place], [place] . He enjoys walking almost every day, including in [facility], and maintains an active lifestyle. He reports sleeping well.      ROS: No CP, no SOB  Studies Reviewed: .        Results LABS ALT: chronically elevated AST: chronically elevated LDL: 79  DIAGNOSTIC Left heart catheterization: 99% mid-circumflex stenosis, treated with a 2.5 x 16 mm drug-eluting stent (02/2009) Risk Assessment/Calculations:            Physical Exam:   VS:  BP 130/70 (BP Location: Left Arm, Patient Position:  Sitting, Cuff Size: Normal)   Pulse 68   Ht 5\' 10"  (1.778 m)   Wt 171 lb (77.6 kg)   SpO2 95%   BMI 24.54 kg/m    Wt Readings from Last 3 Encounters:  06/26/23 171 lb (77.6 kg)  06/16/23 170 lb 9.6 oz (77.4 kg)  09/18/22 172 lb (78 kg)    GEN: Well nourished, well developed in no acute distress NECK: No JVD; No carotid bruits CARDIAC: RRR, no murmurs, no rubs, no gallops RESPIRATORY:  Clear to auscultation without rales, wheezing or rhonchi  ABDOMEN: Soft, non-tender, non-distended EXTREMITIES:  No edema; No deformity   ASSESSMENT AND PLAN: .    Assessment and Plan Assessment & Plan Coronary artery disease Coronary artery disease with myocardial infarction in January 2011, treated with a drug-eluting stent in the midcircumflex artery. Currently asymptomatic with good pump function. Continues on aspirin and atorvastatin  for secondary prevention. Cholesterol levels are well-controlled with an LDL of 79, reducing the risk of future cardiac events. - Continue aspirin 81 mg daily. - Continue atorvastatin  40 mg daily. - Follow up in one year.  Hyperlipidemia Hyperlipidemia is well-managed with atorvastatin . LDL cholesterol is at 79, which is within the target range.  Essential hypertension Essential hypertension is well-controlled with current medication regimen.  Nonalcoholic steatohepatitis (NASH) Nonalcoholic steatohepatitis with chronically elevated ALT and AST.           Signed, Dorothye Gathers, MD

## 2023-07-07 ENCOUNTER — Other Ambulatory Visit (INDEPENDENT_AMBULATORY_CARE_PROVIDER_SITE_OTHER): Admitting: Pharmacist

## 2023-07-07 DIAGNOSIS — E1149 Type 2 diabetes mellitus with other diabetic neurological complication: Secondary | ICD-10-CM

## 2023-07-07 NOTE — Progress Notes (Signed)
 07/07/2023 Name: Manuel [name] MRN: [id number] DOB: 09-21-1928  Chief Complaint  Patient presents with   Diabetes   Medication Management    [name] is a 89 y.o. year old male whose daughter Manuel [name] presented for a telephone visit.   They were referred to the pharmacist by their PCP for assistance in managing diabetes.   Subjective:  Care Team: Primary Care Provider: Arcadio Knuckles, MD ; Next Scheduled Visit: N/A Cardiologist: Dr. Dorothye Gathers; Next Scheduled Visit: N/A Podiatrist: Dr. Ruffin Cotton; Next Scheduled Visit: 09/09/23  Medication Access/Adherence  Current Pharmacy:  [facility] [address] [place] [address] Phone: [phone] Fax: [phone]   Patient reports affordability concerns with their medications: No  Patient reports access/transportation concerns to their pharmacy: No  Patient reports adherence concerns with their medications:  No    Diabetes:  Current medications: metformin  500 mg daily  Patient lives with daughter who fills his pill box weekly and ensures patient takes medications as prescribed. Sees podiatrist every 3 months for foot checks.  Daughter voices no concerns with medications and no issues with adherence.  Objective:  Lab Results  Component Value Date   HGBA1C 6.4 06/16/2023    Lab Results  Component Value Date   CREATININE 0.93 06/16/2023   BUN 17 06/16/2023   NA 139 06/16/2023   K 4.6 06/16/2023   CL 97 06/16/2023   CO2 34 (H) 06/16/2023    Lab Results  Component Value Date   CHOL 155 06/16/2023   HDL 56.70 06/16/2023   LDLCALC 79 06/16/2023   TRIG 95.0 06/16/2023   CHOLHDL 3 06/16/2023    Medications Reviewed Today     Reviewed by Marybelle Smiling, RPH (Pharmacist) on 07/07/23 at 1516  Med List Status: <None>   Medication Order Taking? Sig Documenting Provider Last Dose Status Informant  aspirin EC 81 MG EC tablet [id number] Yes Take 1 tablet (81 mg total) by  mouth daily. Darlis Eisenmenger, MD Taking Active Self, Pharmacy Records  atorvastatin  (LIPITOR) 40 MG tablet [id number] Yes Take 1 tablet (40 mg total) by mouth daily. Arcadio Knuckles, MD Taking Active   fluconazole  (DIFLUCAN ) 150 MG tablet [id number] Yes Take 1 tablet (150 mg total) by mouth once a week. Arcadio Knuckles, MD Taking Active            Med Note Vicenta Graft, JESSICA H   Tue Jul 07, 2023  3:16 PM) Completed course  irbesartan  (AVAPRO ) 150 MG tablet [id number] Yes Take 1 tablet (150 mg total) by mouth daily. Hugh Madura, MD Taking Active   metFORMIN  (GLUCOPHAGE -XR) 500 MG 24 hr tablet [id number] Yes Take 1 tablet (500 mg total) by mouth daily with breakfast. Arcadio Knuckles, MD Taking Active   metoprolol  tartrate (LOPRESSOR ) 25 MG tablet [id number] Yes Take 0.5 tablets (12.5 mg total) by mouth 2 (two) times daily. Arcadio Knuckles, MD Taking Active   Multiple Vitamin (MULTIVITAMIN) tablet [id number] Yes Take 1 tablet by mouth daily. [provider] Taking Active Self, Pharmacy Records  nitroGLYCERIN  (NITROSTAT ) 0.4 MG SL tablet [id number] Yes Place 1 tablet (0.4 mg total) under the tongue every 5 (five) minutes as needed for chest pain. Hugh Madura, MD Taking Active Self, Pharmacy Records  omeprazole  [facility]) 40 MG capsule [id number] Yes Take 1 capsule (40 mg total) by mouth daily. Arcadio Knuckles, MD Taking Active  Assessment/Plan:   Diabetes: - Currently controlled, A1c 6.4% (06/16/23), A1c goal <8% - Noted elevated mACR on most recent labs, however, this is the first elevated reading since 2022. Can continue to monitor mACR for need for additional therapy, such as SGLT2i. Daughter aware. - Recommend to continue metformin  500 mg daily  Follow Up Plan: as needed Reminded daughter to schedule 65-month follow-up with PCP in October.  Abelina Abide, PharmD PGY1 Pharmacy Resident 07/07/2023 3:28 PM  Rainelle Bur, PharmD, BCPS, CPP Clinical Pharmacist  Practitioner [facility] [phone]

## 2023-07-07 NOTE — Patient Instructions (Addendum)
 It was a pleasure speaking with you today!  Continue metformin  once daily. Schedule 67-month follow-up with PCP in October.  Feel free to call the office with any questions or concerns!  Abelina Abide, PharmD PGY1 Pharmacy Resident 07/07/2023 3:32 PM  Rainelle Bur, PharmD, BCPS, CPP Clinical Pharmacist Practitioner [facility] [phone]

## 2023-08-12 ENCOUNTER — Other Ambulatory Visit: Payer: Self-pay | Admitting: Cardiology

## 2023-08-12 ENCOUNTER — Other Ambulatory Visit: Payer: Self-pay

## 2023-08-12 MED ORDER — IRBESARTAN 150 MG PO TABS
150.0000 mg | ORAL_TABLET | Freq: Every day | ORAL | 3 refills | Status: AC
  Filled 2023-08-12: qty 90, 90d supply, fill #0
  Filled 2023-11-10: qty 90, 90d supply, fill #1
  Filled 2024-02-08: qty 90, 90d supply, fill #2

## 2023-08-17 ENCOUNTER — Other Ambulatory Visit: Payer: Self-pay | Admitting: Internal Medicine

## 2023-08-17 DIAGNOSIS — I10 Essential (primary) hypertension: Secondary | ICD-10-CM

## 2023-08-17 DIAGNOSIS — I2510 Atherosclerotic heart disease of native coronary artery without angina pectoris: Secondary | ICD-10-CM

## 2023-08-19 ENCOUNTER — Other Ambulatory Visit: Payer: Self-pay

## 2023-08-19 ENCOUNTER — Other Ambulatory Visit (HOSPITAL_COMMUNITY): Payer: Self-pay

## 2023-08-19 MED ORDER — METOPROLOL TARTRATE 25 MG PO TABS
12.5000 mg | ORAL_TABLET | Freq: Two times a day (BID) | ORAL | 0 refills | Status: DC
  Filled 2023-08-19: qty 90, 90d supply, fill #0

## 2023-08-25 ENCOUNTER — Encounter: Payer: Self-pay | Admitting: Internal Medicine

## 2023-08-25 ENCOUNTER — Ambulatory Visit: Admitting: Internal Medicine

## 2023-08-25 VITALS — BP 140/86 | HR 61 | Temp 97.7°F | Ht 70.0 in | Wt 175.8 lb

## 2023-08-25 DIAGNOSIS — D485 Neoplasm of uncertain behavior of skin: Secondary | ICD-10-CM | POA: Insufficient documentation

## 2023-08-25 DIAGNOSIS — N481 Balanitis: Secondary | ICD-10-CM | POA: Diagnosis not present

## 2023-08-25 DIAGNOSIS — D692 Other nonthrombocytopenic purpura: Secondary | ICD-10-CM | POA: Insufficient documentation

## 2023-08-25 DIAGNOSIS — B356 Tinea cruris: Secondary | ICD-10-CM | POA: Diagnosis not present

## 2023-08-25 DIAGNOSIS — E1149 Type 2 diabetes mellitus with other diabetic neurological complication: Secondary | ICD-10-CM | POA: Diagnosis not present

## 2023-08-25 DIAGNOSIS — N1831 Chronic kidney disease, stage 3a: Secondary | ICD-10-CM

## 2023-08-25 DIAGNOSIS — L719 Rosacea, unspecified: Secondary | ICD-10-CM | POA: Insufficient documentation

## 2023-08-25 DIAGNOSIS — I10 Essential (primary) hypertension: Secondary | ICD-10-CM | POA: Diagnosis not present

## 2023-08-25 DIAGNOSIS — Z85828 Personal history of other malignant neoplasm of skin: Secondary | ICD-10-CM | POA: Insufficient documentation

## 2023-08-25 DIAGNOSIS — D0439 Carcinoma in situ of skin of other parts of face: Secondary | ICD-10-CM | POA: Insufficient documentation

## 2023-08-25 DIAGNOSIS — D1801 Hemangioma of skin and subcutaneous tissue: Secondary | ICD-10-CM | POA: Insufficient documentation

## 2023-08-25 NOTE — Progress Notes (Unsigned)
 Subjective:  Patient ID: Manuel [name], male    DOB: [birth year]  Age: 89 y.o. MRN: [id number]  CC: Skin Check , Hypertension, and Diabetes   HPI Manuel [name] presents for f/up ----  Discussed the use of AI scribe software for clinical note transcription with the patient, who gave verbal consent to proceed.  History of Present Illness   Manuel [name] is a 89 year old male who presents with skin issues and bleeding concerns. He is accompanied by his daughter.  He has experienced resolution of his rash and jock itch after using a prescribed antifungal medication, with no recurrence of symptoms noted.  He experiences bruising and bleeding, particularly noting an incident this morning where a scab on his ear started bleeding. He attributes this to regular aspirin use. His daughter mentions that he has been losing weight, which has been a concern for the family.       Outpatient Medications Prior to Visit  Medication Sig Dispense Refill   aspirin EC 81 MG EC tablet Take 1 tablet (81 mg total) by mouth daily.     atorvastatin  (LIPITOR) 40 MG tablet Take 1 tablet (40 mg total) by mouth daily. 90 tablet 1   fluconazole  (DIFLUCAN ) 150 MG tablet Take 1 tablet (150 mg total) by mouth once a week. 4 tablet 0   irbesartan  (AVAPRO ) 150 MG tablet Take 1 tablet (150 mg total) by mouth daily. 90 tablet 3   metFORMIN  (GLUCOPHAGE -XR) 500 MG 24 hr tablet Take 1 tablet (500 mg total) by mouth daily with breakfast. 90 tablet 1   metoprolol  tartrate (LOPRESSOR ) 25 MG tablet Take 0.5 tablets (12.5 mg total) by mouth 2 (two) times daily. 90 tablet 0   Multiple Vitamin (MULTIVITAMIN) tablet Take 1 tablet by mouth daily.     nitroGLYCERIN  (NITROSTAT ) 0.4 MG SL tablet Place 1 tablet (0.4 mg total) under the tongue every 5 (five) minutes as needed for chest pain. 25 tablet 3   omeprazole  (PRILOSEC) 40 MG capsule Take 1 capsule (40 mg total) by mouth daily. 90 capsule 1   No facility-administered medications prior  to visit.    ROS Review of Systems  Constitutional:  Negative for appetite change, chills, diaphoresis, fatigue and fever.  HENT: Negative.    Eyes:  Negative for discharge.  Respiratory:  Negative for cough, chest tightness and wheezing.   Cardiovascular:  Negative for chest pain, palpitations and leg swelling.  Gastrointestinal: Negative.  Negative for abdominal pain, constipation, diarrhea, nausea and vomiting.  Genitourinary:  Negative for difficulty urinating.  Musculoskeletal:  Positive for arthralgias.  Skin: Negative.  Negative for color change and rash.  Neurological:  Negative for dizziness and weakness.  Hematological:  Bruises/bleeds easily.  Psychiatric/Behavioral:  Positive for behavioral problems and confusion.     Objective:  BP (!) 140/86 (BP Location: Left Arm, Patient Position: Sitting, Cuff Size: Normal)   Pulse 61   Temp 97.7 F (36.5 C) (Oral)   Ht 5' 10 (1.778 m)   Wt 175 lb 12.8 oz (79.7 kg)   SpO2 96%   BMI 25.22 kg/m   BP Readings from Last 3 Encounters:  08/25/23 (!) 140/86  06/26/23 130/70  06/16/23 (!) 148/76    Wt Readings from Last 3 Encounters:  08/25/23 175 lb 12.8 oz (79.7 kg)  06/26/23 171 lb (77.6 kg)  06/16/23 170 lb 9.6 oz (77.4 kg)    Physical Exam Vitals reviewed.  Constitutional:  Appearance: Normal appearance.  HENT:     Mouth/Throat:     Mouth: Mucous membranes are moist.  Eyes:     General: No scleral icterus.    Conjunctiva/sclera: Conjunctivae normal.  Cardiovascular:     Rate and Rhythm: Normal rate and regular rhythm.     Heart sounds: No murmur heard.    No friction rub. No gallop.  Pulmonary:     Effort: Pulmonary effort is normal.     Breath sounds: No stridor. No wheezing, rhonchi or rales.  Abdominal:     General: Abdomen is flat.     Palpations: There is no mass.     Tenderness: There is no abdominal tenderness. There is no guarding.     Hernia: No hernia is present. There is no hernia in the  left inguinal area or right inguinal area.  Genitourinary:    Pubic Area: No rash.      Penis: Normal and circumcised.      Testes: Normal.     Epididymis:     Right: Normal.     Left: Normal.  Musculoskeletal:     Cervical back: Neck supple.  Lymphadenopathy:     Cervical: No cervical adenopathy.     Lower Body: No right inguinal adenopathy. No left inguinal adenopathy.  Skin:    General: Skin is warm and dry.     Findings: No rash.  Neurological:     General: No focal deficit present.     Mental Status: He is alert.     Gait: Gait normal.  Psychiatric:        Mood and Affect: Mood normal.        Behavior: Behavior normal.     Lab Results  Component Value Date   WBC 4.7 06/16/2023   HGB 13.2 06/16/2023   HCT 40.5 06/16/2023   PLT 189.0 06/16/2023   GLUCOSE 111 (H) 06/16/2023   CHOL 155 06/16/2023   TRIG 95.0 06/16/2023   HDL 56.70 06/16/2023   LDLCALC 79 06/16/2023   ALT 26 06/16/2023   AST 29 06/16/2023   NA 139 06/16/2023   K 4.6 06/16/2023   CL 97 06/16/2023   CREATININE 0.93 06/16/2023   BUN 17 06/16/2023   CO2 34 (H) 06/16/2023   TSH 2.10 06/16/2023   INR 1.2 (H) 06/16/2023   HGBA1C 6.4 06/16/2023   MICROALBUR 3.4 (H) 06/16/2023    DG Hips Bilat W or Wo Pelvis 3-4 Views Result Date: 09/07/2022 CLINICAL DATA:  Fall, hip pain . EXAM: DG HIP (WITH OR WITHOUT PELVIS) 3-4V BILAT COMPARISON:  None Available. FINDINGS: There is no evidence of hip fracture or dislocation. There is no evidence of arthropathy or other focal bone abnormality. Vascular calcifications noted IMPRESSION: 1. Negative. Electronically Signed   By: Dorethia Molt M.D.   On: 09/07/2022 20:15   DG Knee Complete 4 Views Right Result Date: 09/07/2022 CLINICAL DATA:  Fall, right knee pain EXAM: RIGHT KNEE - COMPLETE 4+ VIEW COMPARISON:  None Available. FINDINGS: Normal alignment. No acute fracture or dislocation. Mild medial compartment degenerative arthritis. Small right knee effusion. Vascular  calcifications are noted. Mild degenerative enthesopathy seen involving the insertion of the quadriceps tendon upon the patella. Mild prepatellar soft tissue swelling. IMPRESSION: 1. Mild medial compartment degenerative arthritis. 2. Small right knee effusion. Mild prepatellar soft tissue swelling. Electronically Signed   By: Dorethia Molt M.D.   On: 09/07/2022 20:14   DG Knee Complete 4 Views Left Result Date: 09/07/2022 CLINICAL DATA:  Fall EXAM: LEFT KNEE - COMPLETE 4+ VIEW COMPARISON:  None Available. FINDINGS: Normal alignment. No acute fracture or dislocation. Mild degenerative changes are noted within the medial compartment. Small left knee effusion. Vascular calcifications are noted. Degenerative enthesopathy is seen involving the insertion the quadriceps tendon upon the patella. IMPRESSION: 1. Mild degenerative change. Small left knee effusion. Electronically Signed   By: Dorethia Molt M.D.   On: 09/07/2022 20:13   DG Hand Complete Left Result Date: 09/07/2022 CLINICAL DATA:  Fall, left hand pain EXAM: LEFT HAND - COMPLETE 3+ VIEW COMPARISON:  None Available. FINDINGS: Normal alignment. No acute fracture or dislocation. Advanced degenerative changes are noted involving the interphalangeal joint of the thumb as well as the PIP and DIP joints of the second through fifth digits. Mild degenerative changes are seen within the carpus. Soft tissues are unremarkable. IMPRESSION: 1. Degenerative changes. No acute fracture or dislocation. Electronically Signed   By: Dorethia Molt M.D.   On: 09/07/2022 20:12   CT Head Wo Contrast Result Date: 09/07/2022 CLINICAL DATA:  Blunt facial trauma from fall EXAM: CT HEAD WITHOUT CONTRAST CT MAXILLOFACIAL WITHOUT CONTRAST CT CERVICAL SPINE WITHOUT CONTRAST TECHNIQUE: Multidetector CT imaging of the head, cervical spine, and maxillofacial structures were performed using the standard protocol without intravenous contrast. Multiplanar CT image reconstructions of the  cervical spine and maxillofacial structures were also generated. RADIATION DOSE REDUCTION: This exam was performed according to the departmental dose-optimization program which includes automated exposure control, adjustment of the mA and/or kV according to patient size and/or use of iterative reconstruction technique. COMPARISON:  None Available. FINDINGS: CT HEAD FINDINGS Brain: No intracranial hemorrhage, mass effect, or evidence of acute infarct. No hydrocephalus. No extra-axial fluid collection. Generalized cerebral atrophy. Ill-defined hypoattenuation within the cerebral white matter is nonspecific but consistent with chronic small vessel ischemic disease. Chronic right cerebellar infarct. Prominent CSF space or arachnoid cyst in the anterior left temporal fossa. Vascular: No hyperdense vessel. Intracranial arterial calcification. Skull: No fracture or focal lesion. Left periorbital/forehead hematoma Other: None. CT MAXILLOFACIAL FINDINGS Osseous: No fracture or mandibular dislocation. No destructive process. Orbits: Postoperative changes both globes. Globes are intact. No postseptal hematoma. Sinuses: Mucosal thickening in the ethmoid air cells and left frontal sinus. Leftward nasal septal deviation. Soft tissues: Negative. CT CERVICAL SPINE FINDINGS Alignment: No evidence of traumatic malalignment. Skull base and vertebrae: No acute fracture. No primary bone lesion or focal pathologic process. Soft tissues and spinal canal: No prevertebral fluid or swelling. No visible canal hematoma. Disc levels: Multilevel advanced spondylosis, disc space height loss, and degenerative endplate changes. Advanced facet arthropathy on the left at C3-C4 and C4-C5. Posterior disc osteophyte complexes cause up to mild effacement of thecal sac greatest at C5-C6. No severe spinal canal narrowing. Upper chest: No acute abnormality. Other: Carotid calcification. IMPRESSION: 1. No acute intracranial abnormality. 2. No calvarial or  facial fracture. Large left forehead/periorbital hematoma. 3. No acute fracture in the cervical spine. Electronically Signed   By: Norman Gatlin M.D.   On: 09/07/2022 19:57   CT Maxillofacial WO CM Result Date: 09/07/2022 CLINICAL DATA:  Blunt facial trauma from fall EXAM: CT HEAD WITHOUT CONTRAST CT MAXILLOFACIAL WITHOUT CONTRAST CT CERVICAL SPINE WITHOUT CONTRAST TECHNIQUE: Multidetector CT imaging of the head, cervical spine, and maxillofacial structures were performed using the standard protocol without intravenous contrast. Multiplanar CT image reconstructions of the cervical spine and maxillofacial structures were also generated. RADIATION DOSE REDUCTION: This exam was performed according to the departmental dose-optimization program which  includes automated exposure control, adjustment of the mA and/or kV according to patient size and/or use of iterative reconstruction technique. COMPARISON:  None Available. FINDINGS: CT HEAD FINDINGS Brain: No intracranial hemorrhage, mass effect, or evidence of acute infarct. No hydrocephalus. No extra-axial fluid collection. Generalized cerebral atrophy. Ill-defined hypoattenuation within the cerebral white matter is nonspecific but consistent with chronic small vessel ischemic disease. Chronic right cerebellar infarct. Prominent CSF space or arachnoid cyst in the anterior left temporal fossa. Vascular: No hyperdense vessel. Intracranial arterial calcification. Skull: No fracture or focal lesion. Left periorbital/forehead hematoma Other: None. CT MAXILLOFACIAL FINDINGS Osseous: No fracture or mandibular dislocation. No destructive process. Orbits: Postoperative changes both globes. Globes are intact. No postseptal hematoma. Sinuses: Mucosal thickening in the ethmoid air cells and left frontal sinus. Leftward nasal septal deviation. Soft tissues: Negative. CT CERVICAL SPINE FINDINGS Alignment: No evidence of traumatic malalignment. Skull base and vertebrae: No acute  fracture. No primary bone lesion or focal pathologic process. Soft tissues and spinal canal: No prevertebral fluid or swelling. No visible canal hematoma. Disc levels: Multilevel advanced spondylosis, disc space height loss, and degenerative endplate changes. Advanced facet arthropathy on the left at C3-C4 and C4-C5. Posterior disc osteophyte complexes cause up to mild effacement of thecal sac greatest at C5-C6. No severe spinal canal narrowing. Upper chest: No acute abnormality. Other: Carotid calcification. IMPRESSION: 1. No acute intracranial abnormality. 2. No calvarial or facial fracture. Large left forehead/periorbital hematoma. 3. No acute fracture in the cervical spine. Electronically Signed   By: Norman Gatlin M.D.   On: 09/07/2022 19:57   CT Cervical Spine Wo Contrast Result Date: 09/07/2022 CLINICAL DATA:  Blunt facial trauma from fall EXAM: CT HEAD WITHOUT CONTRAST CT MAXILLOFACIAL WITHOUT CONTRAST CT CERVICAL SPINE WITHOUT CONTRAST TECHNIQUE: Multidetector CT imaging of the head, cervical spine, and maxillofacial structures were performed using the standard protocol without intravenous contrast. Multiplanar CT image reconstructions of the cervical spine and maxillofacial structures were also generated. RADIATION DOSE REDUCTION: This exam was performed according to the departmental dose-optimization program which includes automated exposure control, adjustment of the mA and/or kV according to patient size and/or use of iterative reconstruction technique. COMPARISON:  None Available. FINDINGS: CT HEAD FINDINGS Brain: No intracranial hemorrhage, mass effect, or evidence of acute infarct. No hydrocephalus. No extra-axial fluid collection. Generalized cerebral atrophy. Ill-defined hypoattenuation within the cerebral white matter is nonspecific but consistent with chronic small vessel ischemic disease. Chronic right cerebellar infarct. Prominent CSF space or arachnoid cyst in the anterior left temporal  fossa. Vascular: No hyperdense vessel. Intracranial arterial calcification. Skull: No fracture or focal lesion. Left periorbital/forehead hematoma Other: None. CT MAXILLOFACIAL FINDINGS Osseous: No fracture or mandibular dislocation. No destructive process. Orbits: Postoperative changes both globes. Globes are intact. No postseptal hematoma. Sinuses: Mucosal thickening in the ethmoid air cells and left frontal sinus. Leftward nasal septal deviation. Soft tissues: Negative. CT CERVICAL SPINE FINDINGS Alignment: No evidence of traumatic malalignment. Skull base and vertebrae: No acute fracture. No primary bone lesion or focal pathologic process. Soft tissues and spinal canal: No prevertebral fluid or swelling. No visible canal hematoma. Disc levels: Multilevel advanced spondylosis, disc space height loss, and degenerative endplate changes. Advanced facet arthropathy on the left at C3-C4 and C4-C5. Posterior disc osteophyte complexes cause up to mild effacement of thecal sac greatest at C5-C6. No severe spinal canal narrowing. Upper chest: No acute abnormality. Other: Carotid calcification. IMPRESSION: 1. No acute intracranial abnormality. 2. No calvarial or facial fracture. Large left forehead/periorbital hematoma. 3.  No acute fracture in the cervical spine. Electronically Signed   By: Norman Gatlin M.D.   On: 09/07/2022 19:57    Assessment & Plan:   Stage 3a chronic kidney disease (HCC)- Will avoid nephrotoxic agents   Essential hypertension, benign- BP is adequately well controlled.  Diabetes mellitus type 2 with neurological manifestations (HCC)  Tinea cruris- This has resolved.  Balanitis circinata- This has resolved.     Follow-up: Return in about 3 months (around 11/25/2023).  Debby Molt, MD

## 2023-08-25 NOTE — Patient Instructions (Signed)

## 2023-09-02 ENCOUNTER — Ambulatory Visit

## 2023-09-09 ENCOUNTER — Encounter: Payer: Self-pay | Admitting: Podiatry

## 2023-09-09 ENCOUNTER — Ambulatory Visit: Admitting: Podiatry

## 2023-09-09 DIAGNOSIS — E1149 Type 2 diabetes mellitus with other diabetic neurological complication: Secondary | ICD-10-CM

## 2023-09-09 DIAGNOSIS — M79674 Pain in right toe(s): Secondary | ICD-10-CM | POA: Diagnosis not present

## 2023-09-09 DIAGNOSIS — M79675 Pain in left toe(s): Secondary | ICD-10-CM

## 2023-09-09 DIAGNOSIS — B351 Tinea unguium: Secondary | ICD-10-CM

## 2023-09-09 NOTE — Progress Notes (Signed)
This patient returns to my office for at risk foot care.  This patient requires this care by a professional since this patient will be at risk due to having diabetic neuropathy.   This patient is unable to cut nails himself since the patient cannot reach his nails.These nails are painful walking and wearing shoes.  He presents to the office with his daughter. This patient presents for at risk foot care today.  General Appearance  Alert, conversant and in no acute stress.  Vascular  Dorsalis pedis and posterior tibial  pulses are  weakly palpable  bilaterally.  Capillary return is within normal limits  bilaterally. Temperature is within normal limits  bilaterally.  Neurologic  Senn-Weinstein monofilament wire test within normal limits  bilaterally. Muscle power within normal limits bilaterally.  Nails Thick disfigured discolored nails with subungual debris  from hallux to fifth toes bilaterally. No evidence of bacterial infection or drainage bilaterally.  Orthopedic  No limitations of motion  feet .  No crepitus or effusions noted.  No bony pathology or digital deformities noted.  Skin  normotropic skin with no porokeratosis noted bilaterally.  No signs of infections or ulcers noted.     Onychomycosis  Pain in right toes  Pain in left toes  Consent was obtained for treatment procedures.   Mechanical debridement of nails 1-5  bilaterally performed with a nail nipper.  Filed with dremel without incident.    Return office visit    3 months                 Told patient to return for periodic foot care and evaluation due to potential at risk complications.   Gardiner Barefoot DPM

## 2023-09-15 LAB — HM DIABETES EYE EXAM

## 2023-10-05 DIAGNOSIS — C4442 Squamous cell carcinoma of skin of scalp and neck: Secondary | ICD-10-CM | POA: Diagnosis not present

## 2023-10-05 DIAGNOSIS — L570 Actinic keratosis: Secondary | ICD-10-CM | POA: Diagnosis not present

## 2023-10-05 DIAGNOSIS — D485 Neoplasm of uncertain behavior of skin: Secondary | ICD-10-CM | POA: Diagnosis not present

## 2023-10-12 ENCOUNTER — Ambulatory Visit (INDEPENDENT_AMBULATORY_CARE_PROVIDER_SITE_OTHER)

## 2023-10-12 VITALS — Ht 70.0 in | Wt 175.0 lb

## 2023-10-12 DIAGNOSIS — Z0000 Encounter for general adult medical examination without abnormal findings: Secondary | ICD-10-CM

## 2023-10-12 NOTE — Progress Notes (Unsigned)
 Subjective:   Manuel [name] is a 89 y.o. who presents for a Medicare Wellness preventive visit.  As a reminder, Annual Wellness Visits don't include a physical exam, and some assessments may be limited, especially if this visit is performed virtually. We may recommend an in-person follow-up visit with your provider if needed.  Visit Complete: Virtual I connected with  Manuel [name] on 10/12/23 by a video and audio enabled telemedicine application and verified that I am speaking with the correct person using two identifiers.  Patient Location: Home  Provider Location: Home Office  I discussed the limitations of evaluation and management by telemedicine. The patient expressed understanding and agreed to proceed.  Vital Signs: Because this visit was a virtual/telehealth visit, some criteria may be missing or patient reported. Any vitals not documented were not able to be obtained and vitals that have been documented are patient reported.  Persons Participating in Visit: Patient assisted by his daughter.  AWV Questionnaire: No: Patient Medicare AWV questionnaire was not completed prior to this visit.  Cardiac Risk Factors include: advanced age (>55men, >30 women);hypertension;male gender;diabetes mellitus;Other (see comment);dyslipidemia, Risk factor comments: COPD, CKD stage 3a     Objective:    Today's Vitals   10/12/23 1502  Weight: 175 lb (79.4 kg)  Height: 5' 10 (1.778 m)   Body mass index is 25.11 kg/m.     10/12/2023    3:37 PM 09/07/2022    6:04 PM 10/18/2021    2:37 PM 10/09/2020    1:55 PM 05/06/2016    9:07 AM 02/04/2015   12:44 PM 09/06/2014    4:53 PM  Advanced Directives  Does Patient Have a Medical Advance Directive? Yes Yes Yes Yes Yes  Yes  Yes   Type of Estate agent of [place];Living will  Living will;Healthcare Power of Attorney Living will;Healthcare Power of [employer] Power of [place];Living will Living will;Healthcare Power of  Attorney    Does patient want to make changes to medical advance directive?   No - Patient declined No - Patient declined  No - Patient declined    Copy of Healthcare Power of Attorney in Chart? No - copy requested  No - copy requested No - copy requested No - copy requested  Yes  Yes      Data saved with a previous flowsheet row definition    Current Medications (verified) Outpatient Encounter Medications as of 10/12/2023  Medication Sig   aspirin EC 81 MG EC tablet Take 1 tablet (81 mg total) by mouth daily.   atorvastatin  (LIPITOR) 40 MG tablet Take 1 tablet (40 mg total) by mouth daily.   fluconazole  (DIFLUCAN ) 150 MG tablet Take 1 tablet (150 mg total) by mouth once a week.   irbesartan  (AVAPRO ) 150 MG tablet Take 1 tablet (150 mg total) by mouth daily.   metFORMIN  (GLUCOPHAGE -XR) 500 MG 24 hr tablet Take 1 tablet (500 mg total) by mouth daily with breakfast.   metoprolol  tartrate (LOPRESSOR ) 25 MG tablet Take 0.5 tablets (12.5 mg total) by mouth 2 (two) times daily.   Multiple Vitamin (MULTIVITAMIN) tablet Take 1 tablet by mouth daily.   nitroGLYCERIN  (NITROSTAT ) 0.4 MG SL tablet Place 1 tablet (0.4 mg total) under the tongue every 5 (five) minutes as needed for chest pain.   omeprazole  (PRILOSEC) 40 MG capsule Take 1 capsule (40 mg total) by mouth daily.   No facility-administered encounter medications on file as of 10/12/2023.    Allergies (verified) Other  History: Past Medical History:  Diagnosis Date   Allergic rhinitis    childhood   Asthma    Allergic component   CAD (coronary artery disease)    s/p MI in 1/11. LHC (1/11) with 99% mCFX treated with 2.5 x 16 Xience V DES; 50% mLAD; EF 55%. ETT-myoview  (10/12): 4'51, no significant ST segment changes, EF 56%, no ischemia or infarction.   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 No significant bradycardia.    GERD  (gastroesophageal reflux disease)    Hx of cardiovascular stress test    ETT-Myoview  (03/2013):  Inf defect on short axis images only (not felt to be significant), EF 68%; low risk.   Hyperlipidemia    Hypertension    MI (myocardial infarction) (HCC) 02/2009   Pneumonia    age 61   Past Surgical History:  Procedure Laterality Date   APPENDECTOMY     CORONARY STENT PLACEMENT     Family History  Problem Relation Age of Onset   Diabetes Mother    Heart failure Mother    Heart attack Father    Asthma Father    Cancer Neg Hx    Social History   Socioeconomic History   Marital status: Widowed    Spouse name: Not on file   Number of children: Not on file   Years of education: Not on file   Highest education level: Associate degree: academic program  Occupational History   Occupation: retired  Tobacco Use   Smoking status: Former    Current packs/day: 0.00    Types: Cigarettes    Quit date: 06/09/1966    Years since quitting: 57.3   Smokeless tobacco: Never  Vaping Use   Vaping status: Never Used  Substance and Sexual Activity   Alcohol use: No    Alcohol/week: 0.0 standard drinks of alcohol   Drug use: No   Sexual activity: Not Currently  Other Topics Concern   Not on file  Social History Narrative   Associates degree.       Lives alone/2025 Apartment attached to daughters home   Social Drivers of Health   Financial Resource Strain: Low Risk  (10/12/2023)   Overall Financial Resource Strain (CARDIA)    Difficulty of Paying Living Expenses: Not hard at all  Food Insecurity: No Food Insecurity (10/12/2023)   Hunger Vital Sign    Worried About Running Out of Food in the Last Year: Never true    Ran Out of Food in the Last Year: Never true  Transportation Needs: No Transportation Needs (10/12/2023)   PRAPARE - Administrator, Civil Service (Medical): No    Lack of Transportation (Non-Medical): No  Physical Activity: Insufficiently Active (10/12/2023)    Exercise Vital Sign    Days of Exercise per Week: 7 days    Minutes of Exercise per Session: 20 min  Stress: No Stress Concern Present (10/12/2023)   [employer] of Occupational Health - Occupational Stress Questionnaire    Feeling of Stress: Not at all  Social Connections: Moderately Integrated (10/12/2023)   Social Connection and Isolation Panel    Frequency of Communication with Friends and Family: Twice a week    Frequency of Social Gatherings with Friends and Family: More than three times a week    Attends Religious Services: More than 4 times  per year    Active Member of Clubs or Organizations: Yes    Attends Banker Meetings: Never    Marital Status: Widowed    Tobacco Counseling Counseling given: Not Answered    Clinical Intake:  Pre-visit preparation completed: Yes  Pain : No/denies pain     BMI - recorded: 25.11 Nutritional Status: BMI 25 -29 Overweight Nutritional Risks: None Diabetes: Yes CBG done?: No Did pt. bring in CBG monitor from home?: No  Lab Results  Component Value Date   HGBA1C 6.4 06/16/2023   HGBA1C 6.4 06/20/2021   HGBA1C 6.8 (H) 10/09/2020     How often do you need to have someone help you when you read instructions, pamphlets, or other written materials from your doctor or pharmacy?: 1 - Never  Interpreter Needed?: No  Information entered by :: Briggette Najarian, RMA   Activities of Daily Living     10/12/2023    3:03 PM  In your present state of health, do you have any difficulty performing the following activities:  Hearing? 0  Vision? 0  Difficulty concentrating or making decisions? 0  Walking or climbing stairs? 0  Dressing or bathing? 0  Doing errands, shopping? 0  Preparing Food and eating ? N  Using the Toilet? N  In the past six months, have you accidently leaked urine? N  Do you have problems with loss of bowel control? N  Managing your Medications? N  Managing your Finances? N  Housekeeping or  managing your Housekeeping? N    Patient Care Team: Joshua Debby CROME, MD as PCP - General (Internal Medicine) Jeffrie Oneil BROCKS, MD as PCP - Cardiology (Cardiology) Cleotilde Sewer, OD as Consulting Physician (Optometry) Alvia Manuel BIRCH, MD as Consulting Physician (Ophthalmology)  I have updated your Care Teams any recent Medical Services you may have received from other providers in the past year.     Assessment:   This is a routine wellness examination for Manuel [name].  Hearing/Vision screen Hearing Screening - Comments:: Wears hearing aides Vision Screening - Comments:: Wears eyeglasses/Dr. Cleotilde   Goals Addressed   None    Depression Screen     10/12/2023    3:39 PM 08/25/2023    8:46 AM 06/16/2023    9:51 AM 11/14/2021    2:32 PM 10/18/2021    2:43 PM 06/20/2021    3:00 PM 10/09/2020    1:54 PM  PHQ 2/9 Scores  PHQ - 2 Score 0 0 0 1 0 0 0  PHQ- 9 Score 0 0  3  0     Fall Risk     10/12/2023    3:37 PM 08/25/2023    8:46 AM 06/16/2023    9:51 AM 11/14/2021    2:32 PM 10/18/2021    2:38 PM  Fall Risk   Falls in the past year? 0 0  1 1  Number falls in past yr: 0 0 0 0 0  Injury with Fall? 0 0 0 0 0  Risk for fall due to : No Fall Risks No Fall Risks Impaired balance/gait;Impaired mobility History of fall(s) No Fall Risks  Follow up Falls evaluation completed;Falls prevention discussed Falls evaluation completed Falls evaluation completed Falls evaluation completed  Falls evaluation completed      Data saved with a previous flowsheet row definition    MEDICARE RISK AT HOME:  Medicare Risk at Home Any stairs in or around the home?: No (has a ramp) If so, are there any without  handrails?: No Home free of loose throw rugs in walkways, pet beds, electrical cords, etc?: Yes Adequate lighting in your home to reduce risk of falls?: Yes Life alert?: No Use of a cane, walker or w/c?: No Grab bars in the bathroom?: Yes Shower chair or bench in shower?: Yes Elevated toilet seat or a  handicapped toilet?: Yes  TIMED UP AND GO:  Was the test performed?  No  Cognitive Function: Declined/Normal: No cognitive concerns noted by patient or family. Patient alert, oriented, able to answer questions appropriately and recall recent events. No signs of memory loss or confusion.    09/06/2014    4:56 PM  MMSE - Mini Mental State Exam  Not completed: Unable to complete        10/18/2021    2:50 PM  6CIT Screen  What Year? 0 points  What month? 0 points  What time? 0 points  Count back from 20 0 points  Months in reverse 0 points  Repeat phrase 0 points  Total Score 0 points    Immunizations Immunization History  Administered Date(s) Administered   Fluad Quad(high Dose 65+) 11/14/2021   Influenza Whole 12/31/2010, 12/24/2011   Influenza, High Dose Seasonal PF 12/01/2016, 12/06/2017   Influenza,inj,Quad PF,6+ Mos 01/31/2015   Influenza-Unspecified 12/27/2013, 11/29/2015, 12/19/2019   PFIZER(Purple Top)SARS-COV-2 Vaccination 03/17/2019, 04/07/2019, 01/02/2020, 09/17/2020   Pfizer(Comirnaty)Fall Seasonal Vaccine 12 years and older 03/01/2022   Pneumococcal Conjugate-13 11/15/2009, 09/21/2012, 07/18/2014   Pneumococcal Polysaccharide-23 01/31/2015   Td 09/18/2022   Tdap 05/27/2011   Zoster Recombinant(Shingrix ) 11/07/2020, 01/18/2021   Zoster, Live 02/26/2015   Zoster, Unspecified 05/28/2020, 07/23/2020    Screening Tests Health Maintenance  Topic Date Due   INFLUENZA VACCINE  09/25/2023   FOOT EXAM  06/15/2024   OPHTHALMOLOGY EXAM  09/14/2024   Medicare Annual Wellness (AWV)  10/11/2024   DTaP/Tdap/Td (3 - Td or Tdap) 09/17/2032   Pneumococcal Vaccine: 50+ Years  Completed   Zoster Vaccines- Shingrix   Completed   HPV VACCINES  Aged Out   Meningococcal B Vaccine  Aged Out   COVID-19 Vaccine  Discontinued    Health Maintenance  Health Maintenance Due  Topic Date Due   INFLUENZA VACCINE  09/25/2023   Health Maintenance Items Addressed: See Nurse Notes  at the end of this note  Additional Screening:  Vision Screening: Recommended annual ophthalmology exams for early detection of glaucoma and other disorders of the eye. Would you like a referral to an eye doctor? No    Dental Screening: Recommended annual dental exams for proper oral hygiene  Community Resource Referral / Chronic Care Management: CRR required this visit?  No   CCM required this visit?  No   Plan:    I have personally reviewed and noted the following in the patient's chart:   Medical and social history Use of alcohol, tobacco or illicit drugs  Current medications and supplements including opioid prescriptions. Patient is not currently taking opioid prescriptions. Functional ability and status Nutritional status Physical activity Advanced directives List of other physicians Hospitalizations, surgeries, and ER visits in previous 12 months Vitals Screenings to include cognitive, depression, and falls Referrals and appointments  In addition, I have reviewed and discussed with patient certain preventive protocols, quality metrics, and best practice recommendations. A written personalized care plan for preventive services as well as general preventive health recommendations were provided to patient.   Dimetrius Montfort L Wasim Hurlbut, CMA   10/12/2023   After Visit Summary: (MyChart) Due to this being a  telephonic visit, the after visit summary with patients personalized plan was offered to patient via MyChart   Notes: Nothing significant to report at this time.

## 2023-10-12 NOTE — Patient Instructions (Signed)
 Manuel [name] , Thank you for taking time out of your busy schedule to complete your Annual Wellness Visit with me. I enjoyed our conversation and look forward to speaking with you again next year. I, as well as your care team,  appreciate your ongoing commitment to your health goals. Please review the following plan we discussed and let me know if I can assist you in the future. Your Game plan/ To Do List    Referrals: If you haven't heard from the office you've been referred to, please reach out to them at the phone provided.   Follow up Visits: We will see or speak with you next year for your Next Medicare AWV with our clinical staff Have you seen your provider in the last 6 months (3 months if uncontrolled diabetes)? Yes.  Last office visit on 08/25/2023.  Clinician Recommendations:  Aim for 30 minutes of exercise or brisk walking, 6-8 glasses of water, and 5 servings of fruits and vegetables each day. Keep up the good work.      This is a list of the screenings recommended for you:  Health Maintenance  Topic Date Due   Flu Shot  09/25/2023   Complete foot exam   06/15/2024   Eye exam for diabetics  09/14/2024   Medicare Annual Wellness Visit  10/11/2024   DTaP/Tdap/Td vaccine (3 - Td or Tdap) 09/17/2032   Pneumococcal Vaccine for age over 12  Completed   Zoster (Shingles) Vaccine  Completed   HPV Vaccine  Aged Out   Meningitis B Vaccine  Aged Out   COVID-19 Vaccine  Discontinued    Advanced directives: (Copy Requested) Please bring a copy of your health care power of attorney and living will to the office to be added to your chart at your convenience. You can mail to [facility] [address] [place], [place] [address] or email to [email or web address] Advance Care Planning is important because it:  [x]  Makes sure you receive the medical care that is consistent with your values, goals, and preferences  [x]  It provides guidance to your family and loved ones and  reduces their decisional burden about whether or not they are making the right decisions based on your wishes.  Follow the link provided in your after visit summary or read over the paperwork we have mailed to you to help you started getting your Advance Directives in place. If you need assistance in completing these, please reach out to us  so that we can help you!  See attachments for Preventive Care and Fall Prevention Tips.

## 2023-10-22 ENCOUNTER — Other Ambulatory Visit: Payer: Self-pay

## 2023-10-22 ENCOUNTER — Other Ambulatory Visit (HOSPITAL_COMMUNITY): Payer: Self-pay

## 2023-10-22 ENCOUNTER — Ambulatory Visit: Payer: Self-pay | Admitting: Internal Medicine

## 2023-10-22 ENCOUNTER — Ambulatory Visit: Admitting: Internal Medicine

## 2023-10-22 ENCOUNTER — Encounter: Payer: Self-pay | Admitting: Internal Medicine

## 2023-10-22 VITALS — BP 124/68 | HR 69 | Temp 98.2°F | Resp 16 | Ht 70.0 in | Wt 185.2 lb

## 2023-10-22 DIAGNOSIS — N1831 Chronic kidney disease, stage 3a: Secondary | ICD-10-CM

## 2023-10-22 DIAGNOSIS — B356 Tinea cruris: Secondary | ICD-10-CM

## 2023-10-22 DIAGNOSIS — I10 Essential (primary) hypertension: Secondary | ICD-10-CM | POA: Diagnosis not present

## 2023-10-22 DIAGNOSIS — E1149 Type 2 diabetes mellitus with other diabetic neurological complication: Secondary | ICD-10-CM | POA: Diagnosis not present

## 2023-10-22 LAB — CBC WITH DIFFERENTIAL/PLATELET
Basophils Absolute: 0.1 K/uL (ref 0.0–0.1)
Basophils Relative: 1.2 % (ref 0.0–3.0)
Eosinophils Absolute: 0.3 K/uL (ref 0.0–0.7)
Eosinophils Relative: 6.2 % — ABNORMAL HIGH (ref 0.0–5.0)
HCT: 41.6 % (ref 39.0–52.0)
Hemoglobin: 13.4 g/dL (ref 13.0–17.0)
Lymphocytes Relative: 28.2 % (ref 12.0–46.0)
Lymphs Abs: 1.2 K/uL (ref 0.7–4.0)
MCHC: 32.2 g/dL (ref 30.0–36.0)
MCV: 91.8 fl (ref 78.0–100.0)
Monocytes Absolute: 0.5 K/uL (ref 0.1–1.0)
Monocytes Relative: 12.2 % — ABNORMAL HIGH (ref 3.0–12.0)
Neutro Abs: 2.2 K/uL (ref 1.4–7.7)
Neutrophils Relative %: 52.2 % (ref 43.0–77.0)
Platelets: 178 K/uL (ref 150.0–400.0)
RBC: 4.53 Mil/uL (ref 4.22–5.81)
RDW: 14.2 % (ref 11.5–15.5)
WBC: 4.3 K/uL (ref 4.0–10.5)

## 2023-10-22 LAB — BASIC METABOLIC PANEL WITH GFR
BUN: 21 mg/dL (ref 6–23)
CO2: 34 meq/L — ABNORMAL HIGH (ref 19–32)
Calcium: 9.5 mg/dL (ref 8.4–10.5)
Chloride: 98 meq/L (ref 96–112)
Creatinine, Ser: 1.12 mg/dL (ref 0.40–1.50)
GFR: 56.64 mL/min — ABNORMAL LOW (ref >60.00)
Glucose, Bld: 104 mg/dL — ABNORMAL HIGH (ref 70–99)
Potassium: 5.1 meq/L (ref 3.5–5.1)
Sodium: 139 meq/L (ref 135–145)

## 2023-10-22 LAB — HEMOGLOBIN A1C: Hgb A1c MFr Bld: 7.3 % — ABNORMAL HIGH (ref 4.6–6.5)

## 2023-10-22 MED ORDER — KETOCONAZOLE 2 % EX SHAM: 1.0000 | MEDICATED_SHAMPOO | CUTANEOUS | 1 refills | Status: DC | PRN

## 2023-10-22 MED ORDER — KETOCONAZOLE 2 % EX SHAM
1.0000 | MEDICATED_SHAMPOO | CUTANEOUS | 1 refills | Status: DC | PRN
  Filled 2023-10-22: qty 240, 90d supply, fill #0

## 2023-10-22 NOTE — Patient Instructions (Signed)
 Jock Itch Jock itch (tinea cruris) is an infection of the skin in the groin area that is caused by a fungus. Jock itch causes an itchy rash in the groin and upper thigh area. It usually goes away in 2-3 weeks with treatment. What are the causes? The fungus that causes jock itch may be spread by: Touching a fungal infection elsewhere on your body, such as athlete's foot, and then touching your groin area. Sharing towels or clothing, such as socks or shoes, with someone who has a fungal infection. What increases the risk? Jock itch is most common in men and adolescent boys. You are also more likely to develop the condition if you: Are in a hot, humid climate. Wear tight-fitting clothing or wet bathing suits for long periods of time. Play sports. Are overweight. Have diabetes. Have a weakened body defense system (immune system). Sweat a lot. What are the signs or symptoms? Symptoms of jock itch may include: A red, pink, or brown rash in the groin area. Blisters may be present. The rash may spread to the thighs, the opening between the buttocks (anus), and the buttocks. Dry and scaly skin on or around the rash. Itchiness. How is this diagnosed? In most cases, your health care provider can make the diagnosis by looking at your rash. In some cases, a sample of infected skin may be scraped off. This sample may be examined under a microscope (biopsy) or by trying to grow the fungus from the sample (culture). How is this treated? Treatment for this condition may include: Antifungal medicine to kill the fungus. This may be a skin cream, ointment, or powder, or it may be a medicine that you take by mouth (orally). Skin cream or ointment to reduce itching. Lifestyle changes, such as wearing looser clothing and caring for your skin. Follow these instructions at home: Skin care Apply skin creams, ointments, or powders exactly as told by your health care provider. Wear loose-fitting clothing that does  not rub against your groin area. Men should wear boxer shorts or loose-fitting underwear. Keep your groin area clean and dry. Change your underwear every day. Change out of wet bathing suits as soon as possible. After bathing, use a separate towel to gently dry your groin area thoroughly. Using a separate towel will help prevent spreading the infection to other parts of your body. Avoid hot baths and showers. Hot water can make itching worse. Do not scratch the affected area. General instructions Take and apply over-the-counter and prescription medicines only as told by your health care provider. Do not share towels, clothing, or personal items with other people. Wash your hands often with soap and water for at least 20 seconds, especially after touching your groin area. If soap and water are not available, use hand sanitizer. When at the gym: Always wear shoes, especially in the shower and around the swimming pool. Keep any cuts covered. Disinfect any mats or equipment before using them. Shower immediately after working out. Keep all follow-up visits. This is important. Contact a health care provider if: Your rash: Gets worse or does not get better after 2 weeks of treatment. Spreads. Returns after treatment is finished. You have any of the following: A fever. New or worsening redness, swelling, or pain around your rash. Fluid, blood, or pus coming from your rash. Summary Jock itch (tinea cruris) is a fungal infection of the skin in the groin area. The fungus can be spread by sharing clothing or by touching a fungus infection  elsewhere on your body and then touching your groin area. Treatment may include antifungal medicine and lifestyle changes, such as keeping the area clean and dry. This information is not intended to replace advice given to you by your health care provider. Make sure you discuss any questions you have with your health care provider. Document Revised: 05/01/2020  Document Reviewed: 05/01/2020 Elsevier Patient Education  2024 [employer].

## 2023-10-22 NOTE — Progress Notes (Unsigned)
 Subjective:  Patient ID: Manuel [name], male    DOB: Apr 17, 1928  Age: 89 y.o. MRN: [id number]  CC: Rash   HPI IMRI LOR presents for f/up -----  Discussed the use of AI scribe software for clinical note transcription with the patient, who gave verbal consent to proceed.  History of Present Illness Manuel [name] is a 89 year old male who presents for a follow-up visit to check on a groin infection and routine lab work. He is accompanied by his daughter-in-law.  He has a history of tinea cruris, a fungal infection in the groin area, and is currently using talcum powder after showers. He does not feel like he has any ongoing issues in the groin area.  He has a spot on his skin that he believes might be cancerous and has already been evaluated by another provider.  He notes that his long-term memory is good, but his short-term memory is not as reliable.  No dizziness, lightheadedness, chest pain, shortness of breath, swelling in legs or feet, excessive thirst, excessive urination, drastic changes in weight or appetite, skin issues, stomach pain, constipation, or diarrhea.   Outpatient Medications Prior to Visit  Medication Sig Dispense Refill   aspirin EC 81 MG EC tablet Take 1 tablet (81 mg total) by mouth daily.     atorvastatin  (LIPITOR) 40 MG tablet Take 1 tablet (40 mg total) by mouth daily. 90 tablet 1   fluconazole  (DIFLUCAN ) 150 MG tablet Take 1 tablet (150 mg total) by mouth once a week. 4 tablet 0   irbesartan  (AVAPRO ) 150 MG tablet Take 1 tablet (150 mg total) by mouth daily. 90 tablet 3   metFORMIN  (GLUCOPHAGE -XR) 500 MG 24 hr tablet Take 1 tablet (500 mg total) by mouth daily with breakfast. 90 tablet 1   metoprolol  tartrate (LOPRESSOR ) 25 MG tablet Take 0.5 tablets (12.5 mg total) by mouth 2 (two) times daily. 90 tablet 0   Multiple Vitamin (MULTIVITAMIN) tablet Take 1 tablet by mouth daily.     nitroGLYCERIN  (NITROSTAT ) 0.4 MG SL tablet Place 1 tablet (0.4 mg total) under  the tongue every 5 (five) minutes as needed for chest pain. 25 tablet 3   omeprazole  (PRILOSEC) 40 MG capsule Take 1 capsule (40 mg total) by mouth daily. 90 capsule 1   No facility-administered medications prior to visit.    ROS Review of Systems  Constitutional:  Negative for appetite change, chills, diaphoresis, fatigue and fever.  HENT: Negative.    Eyes:  Negative for discharge.  Respiratory: Negative.  Negative for cough, chest tightness, shortness of breath and wheezing.   Cardiovascular:  Negative for chest pain, palpitations and leg swelling.  Gastrointestinal: Negative.  Negative for abdominal pain, constipation, diarrhea, nausea and vomiting.  Genitourinary: Negative.  Negative for difficulty urinating, scrotal swelling and testicular pain.  Musculoskeletal: Negative.  Negative for arthralgias and myalgias.  Skin:  Positive for rash. Negative for color change.  Neurological:  Negative for dizziness, weakness, light-headedness and headaches.  Hematological:  Negative for adenopathy. Does not bruise/bleed easily.  Psychiatric/Behavioral:  Positive for confusion and decreased concentration. Negative for behavioral problems. The patient is not nervous/anxious.     Objective:  BP 124/68 (BP Location: Left Arm, Patient Position: Sitting, Cuff Size: Normal)   Pulse 69   Temp 98.2 F (36.8 C) (Oral)   Resp 16   Ht 5' 10 (1.778 m)   Wt 185 lb 3.2 oz (84 kg)   SpO2 94%   BMI  26.57 kg/m   BP Readings from Last 3 Encounters:  10/22/23 124/68  08/25/23 (!) 140/86  06/26/23 130/70    Wt Readings from Last 3 Encounters:  10/22/23 185 lb 3.2 oz (84 kg)  10/12/23 175 lb (79.4 kg)  08/25/23 175 lb 12.8 oz (79.7 kg)    Physical Exam Vitals reviewed.  Constitutional:      Appearance: Normal appearance.  HENT:     Nose: Nose normal.     Mouth/Throat:     Mouth: Mucous membranes are moist.  Eyes:     General: No scleral icterus.    Conjunctiva/sclera: Conjunctivae  normal.  Cardiovascular:     Rate and Rhythm: Normal rate and regular rhythm.     Heart sounds: No murmur heard.    No friction rub. No gallop.  Pulmonary:     Effort: Pulmonary effort is normal.     Breath sounds: No stridor. No wheezing, rhonchi or rales.  Abdominal:     General: Abdomen is flat.     Palpations: There is no mass.     Tenderness: There is no abdominal tenderness. There is no guarding.     Hernia: No hernia is present. There is no hernia in the left inguinal area or right inguinal area.  Genitourinary:    Pubic Area: Rash present.     Penis: Normal and uncircumcised. No phimosis, paraphimosis, hypospadias, erythema, tenderness, discharge, swelling or lesions.      Testes: Normal.     Epididymis:     Right: Normal.     Left: Normal.       Comments: erythema Musculoskeletal:     Cervical back: Neck supple.     Right lower leg: No edema.     Left lower leg: No edema.  Lymphadenopathy:     Cervical: No cervical adenopathy.     Lower Body: No right inguinal adenopathy. No left inguinal adenopathy.  Skin:    Findings: Erythema and rash present. No lesion.  Neurological:     General: No focal deficit present.     Mental Status: He is alert.  Psychiatric:        Mood and Affect: Mood normal.        Behavior: Behavior normal.     Lab Results  Component Value Date   WBC 4.3 10/22/2023   HGB 13.4 10/22/2023   HCT 41.6 10/22/2023   PLT 178.0 10/22/2023   GLUCOSE 104 (H) 10/22/2023   CHOL 155 06/16/2023   TRIG 95.0 06/16/2023   HDL 56.70 06/16/2023   LDLCALC 79 06/16/2023   ALT 26 06/16/2023   AST 29 06/16/2023   NA 139 10/22/2023   K 5.1 10/22/2023   CL 98 10/22/2023   CREATININE 1.12 10/22/2023   BUN 21 10/22/2023   CO2 34 (H) 10/22/2023   TSH 2.10 06/16/2023   INR 1.2 (H) 06/16/2023   HGBA1C 7.3 (H) 10/22/2023   MICROALBUR 3.4 (H) 06/16/2023    DG Hips Bilat W or Wo Pelvis 3-4 Views Result Date: 09/07/2022 CLINICAL DATA:  Fall, hip pain .  EXAM: DG HIP (WITH OR WITHOUT PELVIS) 3-4V BILAT COMPARISON:  None Available. FINDINGS: There is no evidence of hip fracture or dislocation. There is no evidence of arthropathy or other focal bone abnormality. Vascular calcifications noted IMPRESSION: 1. Negative. Electronically Signed   By: Dorethia Molt M.D.   On: 09/07/2022 20:15   DG Knee Complete 4 Views Right Result Date: 09/07/2022 CLINICAL DATA:  Fall, right knee pain EXAM:  RIGHT KNEE - COMPLETE 4+ VIEW COMPARISON:  None Available. FINDINGS: Normal alignment. No acute fracture or dislocation. Mild medial compartment degenerative arthritis. Small right knee effusion. Vascular calcifications are noted. Mild degenerative enthesopathy seen involving the insertion of the quadriceps tendon upon the patella. Mild prepatellar soft tissue swelling. IMPRESSION: 1. Mild medial compartment degenerative arthritis. 2. Small right knee effusion. Mild prepatellar soft tissue swelling. Electronically Signed   By: Dorethia Molt M.D.   On: 09/07/2022 20:14   DG Knee Complete 4 Views Left Result Date: 09/07/2022 CLINICAL DATA:  Fall EXAM: LEFT KNEE - COMPLETE 4+ VIEW COMPARISON:  None Available. FINDINGS: Normal alignment. No acute fracture or dislocation. Mild degenerative changes are noted within the medial compartment. Small left knee effusion. Vascular calcifications are noted. Degenerative enthesopathy is seen involving the insertion the quadriceps tendon upon the patella. IMPRESSION: 1. Mild degenerative change. Small left knee effusion. Electronically Signed   By: Dorethia Molt M.D.   On: 09/07/2022 20:13   DG Hand Complete Left Result Date: 09/07/2022 CLINICAL DATA:  Fall, left hand pain EXAM: LEFT HAND - COMPLETE 3+ VIEW COMPARISON:  None Available. FINDINGS: Normal alignment. No acute fracture or dislocation. Advanced degenerative changes are noted involving the interphalangeal joint of the thumb as well as the PIP and DIP joints of the second through  fifth digits. Mild degenerative changes are seen within the carpus. Soft tissues are unremarkable. IMPRESSION: 1. Degenerative changes. No acute fracture or dislocation. Electronically Signed   By: Dorethia Molt M.D.   On: 09/07/2022 20:12   CT Head Wo Contrast Result Date: 09/07/2022 CLINICAL DATA:  Blunt facial trauma from fall EXAM: CT HEAD WITHOUT CONTRAST CT MAXILLOFACIAL WITHOUT CONTRAST CT CERVICAL SPINE WITHOUT CONTRAST TECHNIQUE: Multidetector CT imaging of the head, cervical spine, and maxillofacial structures were performed using the standard protocol without intravenous contrast. Multiplanar CT image reconstructions of the cervical spine and maxillofacial structures were also generated. RADIATION DOSE REDUCTION: This exam was performed according to the departmental dose-optimization program which includes automated exposure control, adjustment of the mA and/or kV according to patient size and/or use of iterative reconstruction technique. COMPARISON:  None Available. FINDINGS: CT HEAD FINDINGS Brain: No intracranial hemorrhage, mass effect, or evidence of acute infarct. No hydrocephalus. No extra-axial fluid collection. Generalized cerebral atrophy. Ill-defined hypoattenuation within the cerebral white matter is nonspecific but consistent with chronic small vessel ischemic disease. Chronic right cerebellar infarct. Prominent CSF space or arachnoid cyst in the anterior left temporal fossa. Vascular: No hyperdense vessel. Intracranial arterial calcification. Skull: No fracture or focal lesion. Left periorbital/forehead hematoma Other: None. CT MAXILLOFACIAL FINDINGS Osseous: No fracture or mandibular dislocation. No destructive process. Orbits: Postoperative changes both globes. Globes are intact. No postseptal hematoma. Sinuses: Mucosal thickening in the ethmoid air cells and left frontal sinus. Leftward nasal septal deviation. Soft tissues: Negative. CT CERVICAL SPINE FINDINGS Alignment: No evidence  of traumatic malalignment. Skull base and vertebrae: No acute fracture. No primary bone lesion or focal pathologic process. Soft tissues and spinal canal: No prevertebral fluid or swelling. No visible canal hematoma. Disc levels: Multilevel advanced spondylosis, disc space height loss, and degenerative endplate changes. Advanced facet arthropathy on the left at C3-C4 and C4-C5. Posterior disc osteophyte complexes cause up to mild effacement of thecal sac greatest at C5-C6. No severe spinal canal narrowing. Upper chest: No acute abnormality. Other: Carotid calcification. IMPRESSION: 1. No acute intracranial abnormality. 2. No calvarial or facial fracture. Large left forehead/periorbital hematoma. 3. No acute fracture in the cervical  spine. Electronically Signed   By: Norman Gatlin M.D.   On: 09/07/2022 19:57   CT Maxillofacial WO CM Result Date: 09/07/2022 CLINICAL DATA:  Blunt facial trauma from fall EXAM: CT HEAD WITHOUT CONTRAST CT MAXILLOFACIAL WITHOUT CONTRAST CT CERVICAL SPINE WITHOUT CONTRAST TECHNIQUE: Multidetector CT imaging of the head, cervical spine, and maxillofacial structures were performed using the standard protocol without intravenous contrast. Multiplanar CT image reconstructions of the cervical spine and maxillofacial structures were also generated. RADIATION DOSE REDUCTION: This exam was performed according to the departmental dose-optimization program which includes automated exposure control, adjustment of the mA and/or kV according to patient size and/or use of iterative reconstruction technique. COMPARISON:  None Available. FINDINGS: CT HEAD FINDINGS Brain: No intracranial hemorrhage, mass effect, or evidence of acute infarct. No hydrocephalus. No extra-axial fluid collection. Generalized cerebral atrophy. Ill-defined hypoattenuation within the cerebral white matter is nonspecific but consistent with chronic small vessel ischemic disease. Chronic right cerebellar infarct. Prominent CSF  space or arachnoid cyst in the anterior left temporal fossa. Vascular: No hyperdense vessel. Intracranial arterial calcification. Skull: No fracture or focal lesion. Left periorbital/forehead hematoma Other: None. CT MAXILLOFACIAL FINDINGS Osseous: No fracture or mandibular dislocation. No destructive process. Orbits: Postoperative changes both globes. Globes are intact. No postseptal hematoma. Sinuses: Mucosal thickening in the ethmoid air cells and left frontal sinus. Leftward nasal septal deviation. Soft tissues: Negative. CT CERVICAL SPINE FINDINGS Alignment: No evidence of traumatic malalignment. Skull base and vertebrae: No acute fracture. No primary bone lesion or focal pathologic process. Soft tissues and spinal canal: No prevertebral fluid or swelling. No visible canal hematoma. Disc levels: Multilevel advanced spondylosis, disc space height loss, and degenerative endplate changes. Advanced facet arthropathy on the left at C3-C4 and C4-C5. Posterior disc osteophyte complexes cause up to mild effacement of thecal sac greatest at C5-C6. No severe spinal canal narrowing. Upper chest: No acute abnormality. Other: Carotid calcification. IMPRESSION: 1. No acute intracranial abnormality. 2. No calvarial or facial fracture. Large left forehead/periorbital hematoma. 3. No acute fracture in the cervical spine. Electronically Signed   By: Norman Gatlin M.D.   On: 09/07/2022 19:57   CT Cervical Spine Wo Contrast Result Date: 09/07/2022 CLINICAL DATA:  Blunt facial trauma from fall EXAM: CT HEAD WITHOUT CONTRAST CT MAXILLOFACIAL WITHOUT CONTRAST CT CERVICAL SPINE WITHOUT CONTRAST TECHNIQUE: Multidetector CT imaging of the head, cervical spine, and maxillofacial structures were performed using the standard protocol without intravenous contrast. Multiplanar CT image reconstructions of the cervical spine and maxillofacial structures were also generated. RADIATION DOSE REDUCTION: This exam was performed according to  the departmental dose-optimization program which includes automated exposure control, adjustment of the mA and/or kV according to patient size and/or use of iterative reconstruction technique. COMPARISON:  None Available. FINDINGS: CT HEAD FINDINGS Brain: No intracranial hemorrhage, mass effect, or evidence of acute infarct. No hydrocephalus. No extra-axial fluid collection. Generalized cerebral atrophy. Ill-defined hypoattenuation within the cerebral white matter is nonspecific but consistent with chronic small vessel ischemic disease. Chronic right cerebellar infarct. Prominent CSF space or arachnoid cyst in the anterior left temporal fossa. Vascular: No hyperdense vessel. Intracranial arterial calcification. Skull: No fracture or focal lesion. Left periorbital/forehead hematoma Other: None. CT MAXILLOFACIAL FINDINGS Osseous: No fracture or mandibular dislocation. No destructive process. Orbits: Postoperative changes both globes. Globes are intact. No postseptal hematoma. Sinuses: Mucosal thickening in the ethmoid air cells and left frontal sinus. Leftward nasal septal deviation. Soft tissues: Negative. CT CERVICAL SPINE FINDINGS Alignment: No evidence of traumatic malalignment. Skull base  and vertebrae: No acute fracture. No primary bone lesion or focal pathologic process. Soft tissues and spinal canal: No prevertebral fluid or swelling. No visible canal hematoma. Disc levels: Multilevel advanced spondylosis, disc space height loss, and degenerative endplate changes. Advanced facet arthropathy on the left at C3-C4 and C4-C5. Posterior disc osteophyte complexes cause up to mild effacement of thecal sac greatest at C5-C6. No severe spinal canal narrowing. Upper chest: No acute abnormality. Other: Carotid calcification. IMPRESSION: 1. No acute intracranial abnormality. 2. No calvarial or facial fracture. Large left forehead/periorbital hematoma. 3. No acute fracture in the cervical spine. Electronically Signed   By:  Norman Gatlin M.D.   On: 09/07/2022 19:57    Assessment & Plan:   Diabetes mellitus type 2 with neurological manifestations (HCC)- Blood sugar is adequately well controlled. -     Basic metabolic panel with GFR; Future -     Hemoglobin A1c; Future -     Empagliflozin ; Take 1 tablet (10 mg total) by mouth daily before breakfast.  Dispense: 90 tablet; Refill: 0  Stage 3a chronic kidney disease (HCC)- Will start an SGLT2-inh. -     Basic metabolic panel with GFR; Future -     Empagliflozin ; Take 1 tablet (10 mg total) by mouth daily before breakfast.  Dispense: 90 tablet; Refill: 0  Essential hypertension, benign- BP is well controlled. -     Basic metabolic panel with GFR; Future -     CBC with Differential/Platelet; Future  Tinea cruris -     Ketoconazole ; Apply 1 Application topically every other day as needed for irritation.  Dispense: 240 mL; Refill: 1     Follow-up: Return in about 4 months (around 02/21/2024).  Debby Molt, MD

## 2023-10-23 ENCOUNTER — Other Ambulatory Visit: Payer: Self-pay

## 2023-10-23 MED ORDER — EMPAGLIFLOZIN 10 MG PO TABS: 10.0000 mg | ORAL_TABLET | Freq: Every day | ORAL | 0 refills | Status: DC

## 2023-10-24 ENCOUNTER — Other Ambulatory Visit (HOSPITAL_COMMUNITY): Payer: Self-pay

## 2023-10-26 ENCOUNTER — Other Ambulatory Visit: Payer: Self-pay | Admitting: Internal Medicine

## 2023-10-26 ENCOUNTER — Other Ambulatory Visit (HOSPITAL_COMMUNITY): Payer: Self-pay

## 2023-10-26 ENCOUNTER — Other Ambulatory Visit: Payer: Self-pay

## 2023-10-26 DIAGNOSIS — E1149 Type 2 diabetes mellitus with other diabetic neurological complication: Secondary | ICD-10-CM

## 2023-10-26 DIAGNOSIS — N1831 Chronic kidney disease, stage 3a: Secondary | ICD-10-CM

## 2023-10-26 DIAGNOSIS — B356 Tinea cruris: Secondary | ICD-10-CM

## 2023-10-26 DIAGNOSIS — E785 Hyperlipidemia, unspecified: Secondary | ICD-10-CM

## 2023-10-26 DIAGNOSIS — I2510 Atherosclerotic heart disease of native coronary artery without angina pectoris: Secondary | ICD-10-CM

## 2023-10-26 MED ORDER — KETOCONAZOLE 2 % EX SHAM
1.0000 | MEDICATED_SHAMPOO | CUTANEOUS | 1 refills | Status: DC | PRN
  Filled 2023-10-26: qty 240, 96d supply, fill #0

## 2023-10-26 MED ORDER — EMPAGLIFLOZIN 10 MG PO TABS
10.0000 mg | ORAL_TABLET | Freq: Every day | ORAL | 0 refills | Status: DC
  Filled 2023-10-26: qty 90, 90d supply, fill #0

## 2023-10-26 MED ORDER — ATORVASTATIN CALCIUM 40 MG PO TABS
40.0000 mg | ORAL_TABLET | Freq: Every day | ORAL | 0 refills | Status: DC
  Filled 2023-10-26 – 2023-12-07 (×2): qty 90, 90d supply, fill #0

## 2023-10-27 ENCOUNTER — Other Ambulatory Visit (HOSPITAL_COMMUNITY): Payer: Self-pay

## 2023-10-29 DIAGNOSIS — C4442 Squamous cell carcinoma of skin of scalp and neck: Secondary | ICD-10-CM | POA: Diagnosis not present

## 2023-11-14 ENCOUNTER — Other Ambulatory Visit: Payer: Self-pay | Admitting: Internal Medicine

## 2023-11-14 DIAGNOSIS — I2510 Atherosclerotic heart disease of native coronary artery without angina pectoris: Secondary | ICD-10-CM

## 2023-11-14 DIAGNOSIS — I10 Essential (primary) hypertension: Secondary | ICD-10-CM

## 2023-11-16 ENCOUNTER — Other Ambulatory Visit (HOSPITAL_COMMUNITY): Payer: Self-pay

## 2023-11-16 ENCOUNTER — Other Ambulatory Visit: Payer: Self-pay

## 2023-11-16 MED ORDER — METOPROLOL TARTRATE 25 MG PO TABS
12.5000 mg | ORAL_TABLET | Freq: Two times a day (BID) | ORAL | 0 refills | Status: DC
  Filled 2023-11-16: qty 90, 90d supply, fill #0

## 2023-12-04 ENCOUNTER — Encounter (INDEPENDENT_AMBULATORY_CARE_PROVIDER_SITE_OTHER): Payer: Medicare PPO | Admitting: Ophthalmology

## 2023-12-04 DIAGNOSIS — E113293 Type 2 diabetes mellitus with mild nonproliferative diabetic retinopathy without macular edema, bilateral: Secondary | ICD-10-CM | POA: Diagnosis not present

## 2023-12-04 DIAGNOSIS — Z7984 Long term (current) use of oral hypoglycemic drugs: Secondary | ICD-10-CM

## 2023-12-04 DIAGNOSIS — H33302 Unspecified retinal break, left eye: Secondary | ICD-10-CM

## 2023-12-04 DIAGNOSIS — H43813 Vitreous degeneration, bilateral: Secondary | ICD-10-CM | POA: Diagnosis not present

## 2023-12-07 ENCOUNTER — Other Ambulatory Visit (HOSPITAL_COMMUNITY): Payer: Self-pay

## 2023-12-07 ENCOUNTER — Other Ambulatory Visit: Payer: Self-pay

## 2023-12-07 ENCOUNTER — Other Ambulatory Visit: Payer: Self-pay | Admitting: Internal Medicine

## 2023-12-07 DIAGNOSIS — E1149 Type 2 diabetes mellitus with other diabetic neurological complication: Secondary | ICD-10-CM

## 2023-12-07 DIAGNOSIS — K2100 Gastro-esophageal reflux disease with esophagitis, without bleeding: Secondary | ICD-10-CM

## 2023-12-07 MED ORDER — OMEPRAZOLE 40 MG PO CPDR
40.0000 mg | DELAYED_RELEASE_CAPSULE | Freq: Every day | ORAL | 1 refills | Status: AC
  Filled 2023-12-07: qty 90, 90d supply, fill #0
  Filled 2024-03-06: qty 90, 90d supply, fill #1

## 2023-12-07 MED ORDER — METFORMIN HCL ER 500 MG PO TB24
500.0000 mg | ORAL_TABLET | Freq: Every day | ORAL | 1 refills | Status: AC
  Filled 2023-12-07: qty 90, 90d supply, fill #0
  Filled 2024-03-06: qty 90, 90d supply, fill #1

## 2023-12-18 ENCOUNTER — Other Ambulatory Visit (HOSPITAL_COMMUNITY): Payer: Self-pay

## 2023-12-18 ENCOUNTER — Other Ambulatory Visit: Payer: Self-pay | Admitting: Internal Medicine

## 2023-12-18 DIAGNOSIS — N1831 Chronic kidney disease, stage 3a: Secondary | ICD-10-CM

## 2023-12-18 DIAGNOSIS — E1149 Type 2 diabetes mellitus with other diabetic neurological complication: Secondary | ICD-10-CM

## 2023-12-18 NOTE — Telephone Encounter (Signed)
 Copied from CRM [id number]. Topic: Clinical - Medication Refill >> Dec 18, 2023  2:21 PM Viola F wrote: Medication: empagliflozin  (JARDIANCE ) 10 MG TABS tablet [[id number]]  Has the patient contacted their pharmacy? Yes (Agent: If no, request that the patient contact the pharmacy for the refill. If patient does not wish to contact the pharmacy document the reason why and proceed with request.) (Agent: If yes, when and what did the pharmacy advise?)  This is the patient's preferred pharmacy:  [place] [facility] [address] [place] [address] Phone: [phone] Fax: [phone]  Is this the correct pharmacy for this prescription? Yes If no, delete pharmacy and type the correct one.   Has the prescription been filled recently? Yes  Is the patient out of the medication? Yes  Has the patient been seen for an appointment in the last year OR does the patient have an upcoming appointment? Yes  Can we respond through MyChart? Yes  Agent: Please be advised that Rx refills may take up to 3 business days. We ask that you follow-up with your pharmacy.

## 2023-12-21 ENCOUNTER — Ambulatory Visit: Admitting: Podiatry

## 2023-12-21 ENCOUNTER — Encounter: Payer: Self-pay | Admitting: Podiatry

## 2023-12-21 ENCOUNTER — Other Ambulatory Visit (HOSPITAL_COMMUNITY): Payer: Self-pay

## 2023-12-21 DIAGNOSIS — M79674 Pain in right toe(s): Secondary | ICD-10-CM

## 2023-12-21 DIAGNOSIS — B351 Tinea unguium: Secondary | ICD-10-CM | POA: Diagnosis not present

## 2023-12-21 DIAGNOSIS — M79675 Pain in left toe(s): Secondary | ICD-10-CM | POA: Diagnosis not present

## 2023-12-21 DIAGNOSIS — E1149 Type 2 diabetes mellitus with other diabetic neurological complication: Secondary | ICD-10-CM | POA: Diagnosis not present

## 2023-12-21 MED ORDER — EMPAGLIFLOZIN 10 MG PO TABS
10.0000 mg | ORAL_TABLET | Freq: Every day | ORAL | 0 refills | Status: DC
  Filled 2023-12-21: qty 90, 90d supply, fill #0

## 2023-12-21 NOTE — Progress Notes (Signed)
This patient returns to my office for at risk foot care.  This patient requires this care by a professional since this patient will be at risk due to having diabetic neuropathy.   This patient is unable to cut nails himself since the patient cannot reach his nails.These nails are painful walking and wearing shoes.  He presents to the office with his daughter. This patient presents for at risk foot care today.  General Appearance  Alert, conversant and in no acute stress.  Vascular  Dorsalis pedis and posterior tibial  pulses are  weakly palpable  bilaterally.  Capillary return is within normal limits  bilaterally. Temperature is within normal limits  bilaterally.  Neurologic  Senn-Weinstein monofilament wire test within normal limits  bilaterally. Muscle power within normal limits bilaterally.  Nails Thick disfigured discolored nails with subungual debris  from hallux to fifth toes bilaterally. No evidence of bacterial infection or drainage bilaterally.  Orthopedic  No limitations of motion  feet .  No crepitus or effusions noted.  No bony pathology or digital deformities noted.  Skin  normotropic skin with no porokeratosis noted bilaterally.  No signs of infections or ulcers noted.     Onychomycosis  Pain in right toes  Pain in left toes  Consent was obtained for treatment procedures.   Mechanical debridement of nails 1-5  bilaterally performed with a nail nipper.  Filed with dremel without incident.    Return office visit    3 months                 Told patient to return for periodic foot care and evaluation due to potential at risk complications.   Gardiner Barefoot DPM

## 2023-12-23 ENCOUNTER — Ambulatory Visit: Admitting: Internal Medicine

## 2023-12-25 ENCOUNTER — Other Ambulatory Visit (HOSPITAL_COMMUNITY): Payer: Self-pay

## 2023-12-25 ENCOUNTER — Ambulatory Visit: Admitting: Internal Medicine

## 2023-12-25 ENCOUNTER — Encounter: Payer: Self-pay | Admitting: Internal Medicine

## 2023-12-25 VITALS — BP 132/88 | HR 71 | Temp 98.2°F | Resp 16 | Ht 70.0 in | Wt 182.6 lb

## 2023-12-25 DIAGNOSIS — E1122 Type 2 diabetes mellitus with diabetic chronic kidney disease: Secondary | ICD-10-CM

## 2023-12-25 DIAGNOSIS — Z7984 Long term (current) use of oral hypoglycemic drugs: Secondary | ICD-10-CM

## 2023-12-25 DIAGNOSIS — E1149 Type 2 diabetes mellitus with other diabetic neurological complication: Secondary | ICD-10-CM

## 2023-12-25 DIAGNOSIS — C4442 Squamous cell carcinoma of skin of scalp and neck: Secondary | ICD-10-CM | POA: Insufficient documentation

## 2023-12-25 DIAGNOSIS — Z23 Encounter for immunization: Secondary | ICD-10-CM

## 2023-12-25 DIAGNOSIS — N1831 Chronic kidney disease, stage 3a: Secondary | ICD-10-CM | POA: Diagnosis not present

## 2023-12-25 DIAGNOSIS — I10 Essential (primary) hypertension: Secondary | ICD-10-CM

## 2023-12-25 MED ORDER — EMPAGLIFLOZIN 10 MG PO TABS
10.0000 mg | ORAL_TABLET | Freq: Every day | ORAL | 1 refills | Status: DC
  Filled 2023-12-25: qty 90, 90d supply, fill #0
  Filled 2024-01-25 – 2024-01-29 (×3): qty 30, 30d supply, fill #0
  Filled 2024-02-22: qty 30, 30d supply, fill #1

## 2023-12-25 NOTE — Progress Notes (Signed)
 Subjective:  Patient ID: Manuel [name], male    DOB: [birth year]  Age: 89 y.o. MRN: [id number]  CC: Diabetes   HPI Manuel [name] presents for f/up ---  Discussed the use of AI scribe software for clinical note transcription with the patient, who gave verbal consent to proceed.  History of Present Illness He is a 89 year old male who presents for a follow-up visit.  He has no current rash in the groin area and has not been using the antifungal cream regularly, as he hasn't felt the need for it.  He recently slipped out of a recliner but did not sustain any injuries. No pain, headache, or loss of consciousness was experienced.  No chest pain, shortness of breath, or swelling in his legs or feet.     Outpatient Medications Prior to Visit  Medication Sig Dispense Refill   aspirin EC 81 MG EC tablet Take 1 tablet (81 mg total) by mouth daily.     atorvastatin  (LIPITOR) 40 MG tablet Take 1 tablet (40 mg total) by mouth daily. 90 tablet 0   irbesartan  (AVAPRO ) 150 MG tablet Take 1 tablet (150 mg total) by mouth daily. 90 tablet 3   ketoconazole  (NIZORAL ) 2 % shampoo Apply 1 Application topically every other day as needed for irritation. 240 mL 1   metFORMIN  (GLUCOPHAGE -XR) 500 MG 24 hr tablet Take 1 tablet (500 mg total) by mouth daily with breakfast. 90 tablet 1   metoprolol  tartrate (LOPRESSOR ) 25 MG tablet Take 0.5 tablets (12.5 mg total) by mouth 2 (two) times daily. 90 tablet 0   Multiple Vitamin (MULTIVITAMIN) tablet Take 1 tablet by mouth daily.     nitroGLYCERIN  (NITROSTAT ) 0.4 MG SL tablet Place 1 tablet (0.4 mg total) under the tongue every 5 (five) minutes as needed for chest pain. 25 tablet 3   omeprazole  (PRILOSEC) 40 MG capsule Take 1 capsule (40 mg total) by mouth daily. 90 capsule 1   empagliflozin  (JARDIANCE ) 10 MG TABS tablet Take 1 tablet (10 mg total) by mouth daily before breakfast. 90 tablet 0   No facility-administered medications prior to visit.    ROS Review of  Systems  Constitutional:  Negative for diaphoresis and fatigue.  HENT: Negative.    Eyes: Negative.   Respiratory: Negative.  Negative for chest tightness, shortness of breath and wheezing.   Cardiovascular:  Negative for chest pain, palpitations and leg swelling.  Gastrointestinal:  Negative for abdominal pain, diarrhea, nausea and vomiting.  Endocrine: Negative.   Genitourinary:  Negative for difficulty urinating.  Musculoskeletal: Negative.   Skin: Negative.   Neurological:  Negative for dizziness, weakness, light-headedness and headaches.  Hematological:  Negative for adenopathy. Does not bruise/bleed easily.  Psychiatric/Behavioral:  Positive for confusion and decreased concentration.     Objective:  BP 132/88 (BP Location: Left Arm, Patient Position: Sitting, Cuff Size: Normal)   Pulse 71   Temp 98.2 F (36.8 C) (Oral)   Resp 16   Ht 5' 10 (1.778 m)   Wt 182 lb 9.6 oz (82.8 kg)   SpO2 94%   BMI 26.20 kg/m   BP Readings from Last 3 Encounters:  12/25/23 132/88  10/22/23 124/68  08/25/23 (!) 140/86    Wt Readings from Last 3 Encounters:  12/25/23 182 lb 9.6 oz (82.8 kg)  10/22/23 185 lb 3.2 oz (84 kg)  10/12/23 175 lb (79.4 kg)    Physical Exam Vitals reviewed.  HENT:     Nose: Nose normal.  Mouth/Throat:     Mouth: Mucous membranes are moist.  Eyes:     General: No scleral icterus.    Conjunctiva/sclera: Conjunctivae normal.  Cardiovascular:     Rate and Rhythm: Normal rate and regular rhythm.     Heart sounds: No murmur heard. Pulmonary:     Effort: Pulmonary effort is normal.     Breath sounds: No stridor. No wheezing, rhonchi or rales.  Abdominal:     General: Abdomen is flat.     Palpations: There is no mass.     Tenderness: There is no abdominal tenderness. There is no guarding.     Hernia: No hernia is present.  Musculoskeletal:        General: Normal range of motion.     Cervical back: Neck supple.  Lymphadenopathy:     Cervical: No  cervical adenopathy.  Skin:    General: Skin is warm and dry.     Findings: No rash.  Neurological:     General: No focal deficit present.     Mental Status: He is alert.  Psychiatric:        Mood and Affect: Mood normal.        Behavior: Behavior normal.     Lab Results  Component Value Date   WBC 4.3 10/22/2023   HGB 13.4 10/22/2023   HCT 41.6 10/22/2023   PLT 178.0 10/22/2023   GLUCOSE 104 (H) 10/22/2023   CHOL 155 06/16/2023   TRIG 95.0 06/16/2023   HDL 56.70 06/16/2023   LDLCALC 79 06/16/2023   ALT 26 06/16/2023   AST 29 06/16/2023   NA 139 10/22/2023   K 5.1 10/22/2023   CL 98 10/22/2023   CREATININE 1.12 10/22/2023   BUN 21 10/22/2023   CO2 34 (H) 10/22/2023   TSH 2.10 06/16/2023   INR 1.2 (H) 06/16/2023   HGBA1C 7.3 (H) 10/22/2023   MICROALBUR 3.4 (H) 06/16/2023    DG Hips Bilat W or Wo Pelvis 3-4 Views Result Date: 09/07/2022 CLINICAL DATA:  Fall, hip pain . EXAM: DG HIP (WITH OR WITHOUT PELVIS) 3-4V BILAT COMPARISON:  None Available. FINDINGS: There is no evidence of hip fracture or dislocation. There is no evidence of arthropathy or other focal bone abnormality. Vascular calcifications noted IMPRESSION: 1. Negative. Electronically Signed   By: Dorethia Molt M.D.   On: 09/07/2022 20:15   DG Knee Complete 4 Views Right Result Date: 09/07/2022 CLINICAL DATA:  Fall, right knee pain EXAM: RIGHT KNEE - COMPLETE 4+ VIEW COMPARISON:  None Available. FINDINGS: Normal alignment. No acute fracture or dislocation. Mild medial compartment degenerative arthritis. Small right knee effusion. Vascular calcifications are noted. Mild degenerative enthesopathy seen involving the insertion of the quadriceps tendon upon the patella. Mild prepatellar soft tissue swelling. IMPRESSION: 1. Mild medial compartment degenerative arthritis. 2. Small right knee effusion. Mild prepatellar soft tissue swelling. Electronically Signed   By: Dorethia Molt M.D.   On: 09/07/2022 20:14   DG Knee  Complete 4 Views Left Result Date: 09/07/2022 CLINICAL DATA:  Fall EXAM: LEFT KNEE - COMPLETE 4+ VIEW COMPARISON:  None Available. FINDINGS: Normal alignment. No acute fracture or dislocation. Mild degenerative changes are noted within the medial compartment. Small left knee effusion. Vascular calcifications are noted. Degenerative enthesopathy is seen involving the insertion the quadriceps tendon upon the patella. IMPRESSION: 1. Mild degenerative change. Small left knee effusion. Electronically Signed   By: Dorethia Molt M.D.   On: 09/07/2022 20:13   DG Hand Complete Left Result  Date: 09/07/2022 CLINICAL DATA:  Fall, left hand pain EXAM: LEFT HAND - COMPLETE 3+ VIEW COMPARISON:  None Available. FINDINGS: Normal alignment. No acute fracture or dislocation. Advanced degenerative changes are noted involving the interphalangeal joint of the thumb as well as the PIP and DIP joints of the second through fifth digits. Mild degenerative changes are seen within the carpus. Soft tissues are unremarkable. IMPRESSION: 1. Degenerative changes. No acute fracture or dislocation. Electronically Signed   By: Dorethia Molt M.D.   On: 09/07/2022 20:12   CT Head Wo Contrast Result Date: 09/07/2022 CLINICAL DATA:  Blunt facial trauma from fall EXAM: CT HEAD WITHOUT CONTRAST CT MAXILLOFACIAL WITHOUT CONTRAST CT CERVICAL SPINE WITHOUT CONTRAST TECHNIQUE: Multidetector CT imaging of the head, cervical spine, and maxillofacial structures were performed using the standard protocol without intravenous contrast. Multiplanar CT image reconstructions of the cervical spine and maxillofacial structures were also generated. RADIATION DOSE REDUCTION: This exam was performed according to the departmental dose-optimization program which includes automated exposure control, adjustment of the mA and/or kV according to patient size and/or use of iterative reconstruction technique. COMPARISON:  None Available. FINDINGS: CT HEAD FINDINGS Brain:  No intracranial hemorrhage, mass effect, or evidence of acute infarct. No hydrocephalus. No extra-axial fluid collection. Generalized cerebral atrophy. Ill-defined hypoattenuation within the cerebral white matter is nonspecific but consistent with chronic small vessel ischemic disease. Chronic right cerebellar infarct. Prominent CSF space or arachnoid cyst in the anterior left temporal fossa. Vascular: No hyperdense vessel. Intracranial arterial calcification. Skull: No fracture or focal lesion. Left periorbital/forehead hematoma Other: None. CT MAXILLOFACIAL FINDINGS Osseous: No fracture or mandibular dislocation. No destructive process. Orbits: Postoperative changes both globes. Globes are intact. No postseptal hematoma. Sinuses: Mucosal thickening in the ethmoid air cells and left frontal sinus. Leftward nasal septal deviation. Soft tissues: Negative. CT CERVICAL SPINE FINDINGS Alignment: No evidence of traumatic malalignment. Skull base and vertebrae: No acute fracture. No primary bone lesion or focal pathologic process. Soft tissues and spinal canal: No prevertebral fluid or swelling. No visible canal hematoma. Disc levels: Multilevel advanced spondylosis, disc space height loss, and degenerative endplate changes. Advanced facet arthropathy on the left at C3-C4 and C4-C5. Posterior disc osteophyte complexes cause up to mild effacement of thecal sac greatest at C5-C6. No severe spinal canal narrowing. Upper chest: No acute abnormality. Other: Carotid calcification. IMPRESSION: 1. No acute intracranial abnormality. 2. No calvarial or facial fracture. Large left forehead/periorbital hematoma. 3. No acute fracture in the cervical spine. Electronically Signed   By: Norman Gatlin M.D.   On: 09/07/2022 19:57   CT Maxillofacial WO CM Result Date: 09/07/2022 CLINICAL DATA:  Blunt facial trauma from fall EXAM: CT HEAD WITHOUT CONTRAST CT MAXILLOFACIAL WITHOUT CONTRAST CT CERVICAL SPINE WITHOUT CONTRAST TECHNIQUE:  Multidetector CT imaging of the head, cervical spine, and maxillofacial structures were performed using the standard protocol without intravenous contrast. Multiplanar CT image reconstructions of the cervical spine and maxillofacial structures were also generated. RADIATION DOSE REDUCTION: This exam was performed according to the departmental dose-optimization program which includes automated exposure control, adjustment of the mA and/or kV according to patient size and/or use of iterative reconstruction technique. COMPARISON:  None Available. FINDINGS: CT HEAD FINDINGS Brain: No intracranial hemorrhage, mass effect, or evidence of acute infarct. No hydrocephalus. No extra-axial fluid collection. Generalized cerebral atrophy. Ill-defined hypoattenuation within the cerebral white matter is nonspecific but consistent with chronic small vessel ischemic disease. Chronic right cerebellar infarct. Prominent CSF space or arachnoid cyst in the anterior left temporal  fossa. Vascular: No hyperdense vessel. Intracranial arterial calcification. Skull: No fracture or focal lesion. Left periorbital/forehead hematoma Other: None. CT MAXILLOFACIAL FINDINGS Osseous: No fracture or mandibular dislocation. No destructive process. Orbits: Postoperative changes both globes. Globes are intact. No postseptal hematoma. Sinuses: Mucosal thickening in the ethmoid air cells and left frontal sinus. Leftward nasal septal deviation. Soft tissues: Negative. CT CERVICAL SPINE FINDINGS Alignment: No evidence of traumatic malalignment. Skull base and vertebrae: No acute fracture. No primary bone lesion or focal pathologic process. Soft tissues and spinal canal: No prevertebral fluid or swelling. No visible canal hematoma. Disc levels: Multilevel advanced spondylosis, disc space height loss, and degenerative endplate changes. Advanced facet arthropathy on the left at C3-C4 and C4-C5. Posterior disc osteophyte complexes cause up to mild effacement of  thecal sac greatest at C5-C6. No severe spinal canal narrowing. Upper chest: No acute abnormality. Other: Carotid calcification. IMPRESSION: 1. No acute intracranial abnormality. 2. No calvarial or facial fracture. Large left forehead/periorbital hematoma. 3. No acute fracture in the cervical spine. Electronically Signed   By: Norman Gatlin M.D.   On: 09/07/2022 19:57   CT Cervical Spine Wo Contrast Result Date: 09/07/2022 CLINICAL DATA:  Blunt facial trauma from fall EXAM: CT HEAD WITHOUT CONTRAST CT MAXILLOFACIAL WITHOUT CONTRAST CT CERVICAL SPINE WITHOUT CONTRAST TECHNIQUE: Multidetector CT imaging of the head, cervical spine, and maxillofacial structures were performed using the standard protocol without intravenous contrast. Multiplanar CT image reconstructions of the cervical spine and maxillofacial structures were also generated. RADIATION DOSE REDUCTION: This exam was performed according to the departmental dose-optimization program which includes automated exposure control, adjustment of the mA and/or kV according to patient size and/or use of iterative reconstruction technique. COMPARISON:  None Available. FINDINGS: CT HEAD FINDINGS Brain: No intracranial hemorrhage, mass effect, or evidence of acute infarct. No hydrocephalus. No extra-axial fluid collection. Generalized cerebral atrophy. Ill-defined hypoattenuation within the cerebral white matter is nonspecific but consistent with chronic small vessel ischemic disease. Chronic right cerebellar infarct. Prominent CSF space or arachnoid cyst in the anterior left temporal fossa. Vascular: No hyperdense vessel. Intracranial arterial calcification. Skull: No fracture or focal lesion. Left periorbital/forehead hematoma Other: None. CT MAXILLOFACIAL FINDINGS Osseous: No fracture or mandibular dislocation. No destructive process. Orbits: Postoperative changes both globes. Globes are intact. No postseptal hematoma. Sinuses: Mucosal thickening in the ethmoid  air cells and left frontal sinus. Leftward nasal septal deviation. Soft tissues: Negative. CT CERVICAL SPINE FINDINGS Alignment: No evidence of traumatic malalignment. Skull base and vertebrae: No acute fracture. No primary bone lesion or focal pathologic process. Soft tissues and spinal canal: No prevertebral fluid or swelling. No visible canal hematoma. Disc levels: Multilevel advanced spondylosis, disc space height loss, and degenerative endplate changes. Advanced facet arthropathy on the left at C3-C4 and C4-C5. Posterior disc osteophyte complexes cause up to mild effacement of thecal sac greatest at C5-C6. No severe spinal canal narrowing. Upper chest: No acute abnormality. Other: Carotid calcification. IMPRESSION: 1. No acute intracranial abnormality. 2. No calvarial or facial fracture. Large left forehead/periorbital hematoma. 3. No acute fracture in the cervical spine. Electronically Signed   By: Norman Gatlin M.D.   On: 09/07/2022 19:57    Assessment & Plan:  Need for immunization against influenza -     Flu vaccine HIGH DOSE PF(Fluzone  Trivalent)  Diabetes mellitus type 2 with neurological manifestations (HCC)- Blood sugar is well controlled. -     Empagliflozin ; Take 1 tablet (10 mg total) by mouth daily before breakfast.  Dispense: 90 tablet; Refill:  0  Stage 3a chronic kidney disease (HCC)- Will avoid nephrotoxic agents  -     Empagliflozin ; Take 1 tablet (10 mg total) by mouth daily before breakfast.  Dispense: 90 tablet; Refill: 0  Essential hypertension, benign- BP is well controlled.     Follow-up: Return in about 3 months (around 03/26/2024).  Debby Molt, MD

## 2023-12-25 NOTE — Patient Instructions (Signed)

## 2023-12-29 ENCOUNTER — Other Ambulatory Visit (HOSPITAL_COMMUNITY): Payer: Self-pay

## 2024-01-14 ENCOUNTER — Ambulatory Visit: Admitting: Internal Medicine

## 2024-01-14 ENCOUNTER — Ambulatory Visit: Payer: Self-pay | Admitting: Internal Medicine

## 2024-01-14 ENCOUNTER — Encounter: Payer: Self-pay | Admitting: Internal Medicine

## 2024-01-14 VITALS — BP 132/82 | HR 72 | Temp 98.1°F | Resp 16 | Ht 70.0 in | Wt 184.6 lb

## 2024-01-14 DIAGNOSIS — N1831 Chronic kidney disease, stage 3a: Secondary | ICD-10-CM | POA: Diagnosis not present

## 2024-01-14 DIAGNOSIS — I10 Essential (primary) hypertension: Secondary | ICD-10-CM

## 2024-01-14 DIAGNOSIS — E1149 Type 2 diabetes mellitus with other diabetic neurological complication: Secondary | ICD-10-CM | POA: Diagnosis not present

## 2024-01-14 LAB — BASIC METABOLIC PANEL WITH GFR
BUN: 17 mg/dL (ref 6–23)
CO2: 32 meq/L (ref 19–32)
Calcium: 9.2 mg/dL (ref 8.4–10.5)
Chloride: 100 meq/L (ref 96–112)
Creatinine, Ser: 1.21 mg/dL (ref 0.40–1.50)
GFR: 51.54 mL/min — ABNORMAL LOW (ref >60.00)
Glucose, Bld: 117 mg/dL — ABNORMAL HIGH (ref 70–99)
Potassium: 4.6 meq/L (ref 3.5–5.1)
Sodium: 139 meq/L (ref 135–145)

## 2024-01-14 LAB — TSH: TSH: 2.75 u[IU]/mL (ref 0.35–5.50)

## 2024-01-14 LAB — HEMOGLOBIN A1C: Hgb A1c MFr Bld: 6.7 % — ABNORMAL HIGH (ref 4.6–6.5)

## 2024-01-14 NOTE — Progress Notes (Signed)
 Subjective:  Patient ID: Manuel [name], male    DOB: 09/12/1928  Age: 89 y.o. MRN: [id number]  CC: Hypertension, Diabetes, and Coronary Artery Disease   HPI DASON MOSLEY presents for f/up ---   Discussed the use of AI scribe software for clinical note transcription with the patient, who gave verbal consent to proceed.  History of Present Illness Manuel [name] is a 89 year old male who presents for a routine follow-up visit.  He has a bruise on his head that has persisted for about a month following a minor fall where he hit his head on a chair. He notes that it 'seemed like it was there forever' but has no associated headache, numbness, weakness, or tingling.  He has a good appetite and sleeps well. He engages in regular physical activity by walking up a hill in his driveway three to four times a day.  He has regular quarterly visits with a podiatrist for foot care related to diabetes, including skin checks and monitoring of his toes.  No chest pain, shortness of breath, dizziness, lightheadedness, headache, blurred vision, rash in the groin, fevers, chills, numbness, weakness, tingling, or swelling in legs or feet.     Outpatient Medications Prior to Visit  Medication Sig Dispense Refill   aspirin EC 81 MG EC tablet Take 1 tablet (81 mg total) by mouth daily.     atorvastatin  (LIPITOR) 40 MG tablet Take 1 tablet (40 mg total) by mouth daily. 90 tablet 0   empagliflozin  (JARDIANCE ) 10 MG TABS tablet Take 1 tablet (10 mg total) by mouth daily before breakfast. 90 tablet 0   irbesartan  (AVAPRO ) 150 MG tablet Take 1 tablet (150 mg total) by mouth daily. 90 tablet 3   ketoconazole  (NIZORAL ) 2 % shampoo Apply 1 Application topically every other day as needed for irritation. 240 mL 1   metFORMIN  (GLUCOPHAGE -XR) 500 MG 24 hr tablet Take 1 tablet (500 mg total) by mouth daily with breakfast. 90 tablet 1   metoprolol  tartrate (LOPRESSOR ) 25 MG tablet Take 0.5 tablets (12.5 mg total) by mouth 2  (two) times daily. 90 tablet 0   Multiple Vitamin (MULTIVITAMIN) tablet Take 1 tablet by mouth daily.     nitroGLYCERIN  (NITROSTAT ) 0.4 MG SL tablet Place 1 tablet (0.4 mg total) under the tongue every 5 (five) minutes as needed for chest pain. 25 tablet 3   omeprazole  (PRILOSEC) 40 MG capsule Take 1 capsule (40 mg total) by mouth daily. 90 capsule 1   No facility-administered medications prior to visit.    ROS Review of Systems  Constitutional:  Negative for appetite change, chills, diaphoresis and fatigue.  HENT: Negative.    Eyes: Negative.  Negative for visual disturbance.  Respiratory:  Negative for cough, chest tightness, shortness of breath and wheezing.   Cardiovascular:  Negative for chest pain, palpitations and leg swelling.  Gastrointestinal: Negative.  Negative for abdominal pain, constipation, diarrhea, nausea and vomiting.  Endocrine: Negative.   Genitourinary:  Negative for difficulty urinating.  Musculoskeletal: Negative.   Skin: Negative.   Neurological:  Negative for dizziness, seizures, weakness, light-headedness, numbness and headaches.  Hematological:  Negative for adenopathy. Does not bruise/bleed easily.  Psychiatric/Behavioral:  Positive for confusion and decreased concentration.     Objective:  BP 132/82 (BP Location: Left Arm, Patient Position: Sitting, Cuff Size: Normal)   Pulse 72   Temp 98.1 F (36.7 C) (Oral)   Resp 16   Ht 5' 10 (1.778 m)   Wt  184 lb 9.6 oz (83.7 kg)   SpO2 94%   BMI 26.49 kg/m   BP Readings from Last 3 Encounters:  01/14/24 132/82  12/25/23 132/88  10/22/23 124/68    Wt Readings from Last 3 Encounters:  01/14/24 184 lb 9.6 oz (83.7 kg)  12/25/23 182 lb 9.6 oz (82.8 kg)  10/22/23 185 lb 3.2 oz (84 kg)    Physical Exam Vitals reviewed.  Constitutional:      Appearance: Normal appearance.  HENT:     Nose: Nose normal.     Mouth/Throat:     Mouth: Mucous membranes are moist.  Eyes:     General: No scleral  icterus.    Conjunctiva/sclera: Conjunctivae normal.  Cardiovascular:     Rate and Rhythm: Normal rate and regular rhythm. Occasional Extrasystoles are present.    Heart sounds: No murmur heard.    No friction rub. No gallop.     Comments: EKG- SR with 1st degree AV block with occasional PVC, 65 bpm LAD No LVH Inferior infarct pattern Unchanged  Pulmonary:     Effort: Pulmonary effort is normal.     Breath sounds: No stridor. No wheezing, rhonchi or rales.  Abdominal:     General: Abdomen is flat.     Palpations: There is no mass.     Tenderness: There is no abdominal tenderness. There is no guarding.     Hernia: No hernia is present.  Musculoskeletal:     Cervical back: Neck supple.     Right lower leg: No edema.     Left lower leg: No edema.  Lymphadenopathy:     Cervical: No cervical adenopathy.  Skin:    General: Skin is warm and dry.  Neurological:     Mental Status: He is alert. Mental status is at baseline.  Psychiatric:        Mood and Affect: Mood normal.        Behavior: Behavior normal.     Lab Results  Component Value Date   WBC 4.3 10/22/2023   HGB 13.4 10/22/2023   HCT 41.6 10/22/2023   PLT 178.0 10/22/2023   GLUCOSE 117 (H) 01/14/2024   CHOL 155 06/16/2023   TRIG 95.0 06/16/2023   HDL 56.70 06/16/2023   LDLCALC 79 06/16/2023   ALT 26 06/16/2023   AST 29 06/16/2023   NA 139 01/14/2024   K 4.6 01/14/2024   CL 100 01/14/2024   CREATININE 1.21 01/14/2024   BUN 17 01/14/2024   CO2 32 01/14/2024   TSH 2.75 01/14/2024   INR 1.2 (H) 06/16/2023   HGBA1C 6.7 (H) 01/14/2024   MICROALBUR 3.4 (H) 06/16/2023    DG Hips Bilat W or Wo Pelvis 3-4 Views Result Date: 09/07/2022 CLINICAL DATA:  Fall, hip pain . EXAM: DG HIP (WITH OR WITHOUT PELVIS) 3-4V BILAT COMPARISON:  None Available. FINDINGS: There is no evidence of hip fracture or dislocation. There is no evidence of arthropathy or other focal bone abnormality. Vascular calcifications noted IMPRESSION:  1. Negative. Electronically Signed   By: Dorethia Molt M.D.   On: 09/07/2022 20:15   DG Knee Complete 4 Views Right Result Date: 09/07/2022 CLINICAL DATA:  Fall, right knee pain EXAM: RIGHT KNEE - COMPLETE 4+ VIEW COMPARISON:  None Available. FINDINGS: Normal alignment. No acute fracture or dislocation. Mild medial compartment degenerative arthritis. Small right knee effusion. Vascular calcifications are noted. Mild degenerative enthesopathy seen involving the insertion of the quadriceps tendon upon the patella. Mild prepatellar soft tissue swelling.  IMPRESSION: 1. Mild medial compartment degenerative arthritis. 2. Small right knee effusion. Mild prepatellar soft tissue swelling. Electronically Signed   By: Dorethia Molt M.D.   On: 09/07/2022 20:14   DG Knee Complete 4 Views Left Result Date: 09/07/2022 CLINICAL DATA:  Fall EXAM: LEFT KNEE - COMPLETE 4+ VIEW COMPARISON:  None Available. FINDINGS: Normal alignment. No acute fracture or dislocation. Mild degenerative changes are noted within the medial compartment. Small left knee effusion. Vascular calcifications are noted. Degenerative enthesopathy is seen involving the insertion the quadriceps tendon upon the patella. IMPRESSION: 1. Mild degenerative change. Small left knee effusion. Electronically Signed   By: Dorethia Molt M.D.   On: 09/07/2022 20:13   DG Hand Complete Left Result Date: 09/07/2022 CLINICAL DATA:  Fall, left hand pain EXAM: LEFT HAND - COMPLETE 3+ VIEW COMPARISON:  None Available. FINDINGS: Normal alignment. No acute fracture or dislocation. Advanced degenerative changes are noted involving the interphalangeal joint of the thumb as well as the PIP and DIP joints of the second through fifth digits. Mild degenerative changes are seen within the carpus. Soft tissues are unremarkable. IMPRESSION: 1. Degenerative changes. No acute fracture or dislocation. Electronically Signed   By: Dorethia Molt M.D.   On: 09/07/2022 20:12   CT Head Wo  Contrast Result Date: 09/07/2022 CLINICAL DATA:  Blunt facial trauma from fall EXAM: CT HEAD WITHOUT CONTRAST CT MAXILLOFACIAL WITHOUT CONTRAST CT CERVICAL SPINE WITHOUT CONTRAST TECHNIQUE: Multidetector CT imaging of the head, cervical spine, and maxillofacial structures were performed using the standard protocol without intravenous contrast. Multiplanar CT image reconstructions of the cervical spine and maxillofacial structures were also generated. RADIATION DOSE REDUCTION: This exam was performed according to the departmental dose-optimization program which includes automated exposure control, adjustment of the mA and/or kV according to patient size and/or use of iterative reconstruction technique. COMPARISON:  None Available. FINDINGS: CT HEAD FINDINGS Brain: No intracranial hemorrhage, mass effect, or evidence of acute infarct. No hydrocephalus. No extra-axial fluid collection. Generalized cerebral atrophy. Ill-defined hypoattenuation within the cerebral white matter is nonspecific but consistent with chronic small vessel ischemic disease. Chronic right cerebellar infarct. Prominent CSF space or arachnoid cyst in the anterior left temporal fossa. Vascular: No hyperdense vessel. Intracranial arterial calcification. Skull: No fracture or focal lesion. Left periorbital/forehead hematoma Other: None. CT MAXILLOFACIAL FINDINGS Osseous: No fracture or mandibular dislocation. No destructive process. Orbits: Postoperative changes both globes. Globes are intact. No postseptal hematoma. Sinuses: Mucosal thickening in the ethmoid air cells and left frontal sinus. Leftward nasal septal deviation. Soft tissues: Negative. CT CERVICAL SPINE FINDINGS Alignment: No evidence of traumatic malalignment. Skull base and vertebrae: No acute fracture. No primary bone lesion or focal pathologic process. Soft tissues and spinal canal: No prevertebral fluid or swelling. No visible canal hematoma. Disc levels: Multilevel advanced  spondylosis, disc space height loss, and degenerative endplate changes. Advanced facet arthropathy on the left at C3-C4 and C4-C5. Posterior disc osteophyte complexes cause up to mild effacement of thecal sac greatest at C5-C6. No severe spinal canal narrowing. Upper chest: No acute abnormality. Other: Carotid calcification. IMPRESSION: 1. No acute intracranial abnormality. 2. No calvarial or facial fracture. Large left forehead/periorbital hematoma. 3. No acute fracture in the cervical spine. Electronically Signed   By: Norman Gatlin M.D.   On: 09/07/2022 19:57   CT Maxillofacial WO CM Result Date: 09/07/2022 CLINICAL DATA:  Blunt facial trauma from fall EXAM: CT HEAD WITHOUT CONTRAST CT MAXILLOFACIAL WITHOUT CONTRAST CT CERVICAL SPINE WITHOUT CONTRAST TECHNIQUE: Multidetector CT imaging  of the head, cervical spine, and maxillofacial structures were performed using the standard protocol without intravenous contrast. Multiplanar CT image reconstructions of the cervical spine and maxillofacial structures were also generated. RADIATION DOSE REDUCTION: This exam was performed according to the departmental dose-optimization program which includes automated exposure control, adjustment of the mA and/or kV according to patient size and/or use of iterative reconstruction technique. COMPARISON:  None Available. FINDINGS: CT HEAD FINDINGS Brain: No intracranial hemorrhage, mass effect, or evidence of acute infarct. No hydrocephalus. No extra-axial fluid collection. Generalized cerebral atrophy. Ill-defined hypoattenuation within the cerebral white matter is nonspecific but consistent with chronic small vessel ischemic disease. Chronic right cerebellar infarct. Prominent CSF space or arachnoid cyst in the anterior left temporal fossa. Vascular: No hyperdense vessel. Intracranial arterial calcification. Skull: No fracture or focal lesion. Left periorbital/forehead hematoma Other: None. CT MAXILLOFACIAL FINDINGS Osseous:  No fracture or mandibular dislocation. No destructive process. Orbits: Postoperative changes both globes. Globes are intact. No postseptal hematoma. Sinuses: Mucosal thickening in the ethmoid air cells and left frontal sinus. Leftward nasal septal deviation. Soft tissues: Negative. CT CERVICAL SPINE FINDINGS Alignment: No evidence of traumatic malalignment. Skull base and vertebrae: No acute fracture. No primary bone lesion or focal pathologic process. Soft tissues and spinal canal: No prevertebral fluid or swelling. No visible canal hematoma. Disc levels: Multilevel advanced spondylosis, disc space height loss, and degenerative endplate changes. Advanced facet arthropathy on the left at C3-C4 and C4-C5. Posterior disc osteophyte complexes cause up to mild effacement of thecal sac greatest at C5-C6. No severe spinal canal narrowing. Upper chest: No acute abnormality. Other: Carotid calcification. IMPRESSION: 1. No acute intracranial abnormality. 2. No calvarial or facial fracture. Large left forehead/periorbital hematoma. 3. No acute fracture in the cervical spine. Electronically Signed   By: Norman Gatlin M.D.   On: 09/07/2022 19:57   CT Cervical Spine Wo Contrast Result Date: 09/07/2022 CLINICAL DATA:  Blunt facial trauma from fall EXAM: CT HEAD WITHOUT CONTRAST CT MAXILLOFACIAL WITHOUT CONTRAST CT CERVICAL SPINE WITHOUT CONTRAST TECHNIQUE: Multidetector CT imaging of the head, cervical spine, and maxillofacial structures were performed using the standard protocol without intravenous contrast. Multiplanar CT image reconstructions of the cervical spine and maxillofacial structures were also generated. RADIATION DOSE REDUCTION: This exam was performed according to the departmental dose-optimization program which includes automated exposure control, adjustment of the mA and/or kV according to patient size and/or use of iterative reconstruction technique. COMPARISON:  None Available. FINDINGS: CT HEAD FINDINGS  Brain: No intracranial hemorrhage, mass effect, or evidence of acute infarct. No hydrocephalus. No extra-axial fluid collection. Generalized cerebral atrophy. Ill-defined hypoattenuation within the cerebral white matter is nonspecific but consistent with chronic small vessel ischemic disease. Chronic right cerebellar infarct. Prominent CSF space or arachnoid cyst in the anterior left temporal fossa. Vascular: No hyperdense vessel. Intracranial arterial calcification. Skull: No fracture or focal lesion. Left periorbital/forehead hematoma Other: None. CT MAXILLOFACIAL FINDINGS Osseous: No fracture or mandibular dislocation. No destructive process. Orbits: Postoperative changes both globes. Globes are intact. No postseptal hematoma. Sinuses: Mucosal thickening in the ethmoid air cells and left frontal sinus. Leftward nasal septal deviation. Soft tissues: Negative. CT CERVICAL SPINE FINDINGS Alignment: No evidence of traumatic malalignment. Skull base and vertebrae: No acute fracture. No primary bone lesion or focal pathologic process. Soft tissues and spinal canal: No prevertebral fluid or swelling. No visible canal hematoma. Disc levels: Multilevel advanced spondylosis, disc space height loss, and degenerative endplate changes. Advanced facet arthropathy on the left at C3-C4 and C4-C5.  Posterior disc osteophyte complexes cause up to mild effacement of thecal sac greatest at C5-C6. No severe spinal canal narrowing. Upper chest: No acute abnormality. Other: Carotid calcification. IMPRESSION: 1. No acute intracranial abnormality. 2. No calvarial or facial fracture. Large left forehead/periorbital hematoma. 3. No acute fracture in the cervical spine. Electronically Signed   By: Norman Gatlin M.D.   On: 09/07/2022 19:57    Assessment & Plan:  Essential hypertension, benign- BP is well controlled. EKG is negative for LVH. -     Basic metabolic panel with GFR; Future -     EKG 12-Lead -     TSH; Future  Diabetes  mellitus type 2 with neurological manifestations (HCC)- Blood sugar is well controlled -     Basic metabolic panel with GFR; Future -     Hemoglobin A1c; Future  Stage 3a chronic kidney disease (HCC)- Will avoid nephrotoxic agents      Follow-up: Return in about 3 months (around 04/15/2024).  Debby Molt, MD

## 2024-01-14 NOTE — Patient Instructions (Signed)
 Premature Ventricular Contraction  A premature ventricular contraction (PVC) is a type of irregular heartbeat (arrhythmia). The heart has four chambers, including the upper chambers (atria) and lower chambers (ventricles). Normally, an electrical signal starts in a group of cells called the sinoatrial node (SA node) and travels through the atria, causing them to pump blood into the ventricles. During a PVC, the heartbeat starts in one of the lower ventricles. This may cause the heartbeat to be shorter and less effective. In most cases, PVCs come and go and do not require treatment. What are the causes? Common causes of the condition include: Heart attack or coronary artery disease (CAD). Heart valve problems. Heart surgery. Infection of the heart (myocarditis). Inflammation of the heart. In many cases, the cause of this condition is not known. What increases the risk? The following factors may make you more likely to develop this condition: Age, especially being over age 19. Being male. An imbalance of salts and minerals in the body (electrolytes). Low blood oxygen levels or high carbon dioxide levels. Certain medicines, including over-the-counter and prescribed medicines. High blood pressure. Obesity. Episodes may be triggered by: Vigorous exercise. Tobacco, alcohol, or caffeine use. Illegal drug use. Emotional stress. Poor or irregular sleep. What are the signs or symptoms? The main symptoms of this condition are fast or irregular heartbeats (palpitations) or the feeling of a pause in the heartbeat. Other symptoms include: Shortness of breath. Difficulty exercising. Chest pain. Feeling tired. Dizziness. In some cases, there are no symptoms. How is this diagnosed? This condition may be diagnosed based on: Your medical history or symptoms. A physical exam. Your health care provider may listen to your heart. Tests, such as: Blood tests. An ECG (electrocardiogram) to monitor the  electrical activity of your heart. An ambulatory cardiac monitor that records your heartbeats for 24 hours or more. Stress tests to see how exercise affects your heart rhythm and blood supply. An echocardiogram, which creates an image of your heart. An electrophysiology study (EPS) to check for electrical problems in your heart. How is this treated? Treatment for this condition depends on any underlying conditions, the type of PVC, how many PVCs you have had, and if the symptoms are affecting your daily life. Possible treatments include: Avoiding things that cause PVCs (triggers). These include caffeine, tobacco, and alcohol. Taking medicines if symptoms are severe or if the arrhythmias happen a lot. Getting treatment for underlying conditions that cause PVCs. Having an implantable cardioverter defibrillator (ICD) placed in the chest to monitor the heartbeat. The monitor sends a shock to the heart if it senses an arrhythmia and brings the heartbeat back to normal. Having a catheter ablation procedure to destroy the part of the heart tissue that sends abnormal signals. In many cases, no treatment is required. Follow these instructions at home: Lifestyle  Do not use any products that contain nicotine or tobacco. These products include cigarettes, chewing tobacco, and vaping devices, such as e-cigarettes. If you need help quitting, ask your health care provider. Do not use illegal drugs. Exercise regularly. Ask your health care provider what type of exercise is safe for you. Try to get at least 7-9 hours of sleep each night. Find healthy ways to manage stress. Avoid stressful situations when possible. Alcohol use Do not drink alcohol if: Your health care provider tells you not to drink. You are pregnant, may be pregnant, or are planning to become pregnant. Alcohol triggers your episodes. If you drink alcohol: Limit how much you have to: 0-1  drink a day for women. 0-2 drinks a day for  men. Know how much alcohol is in your drink. In the [place] one drink equals one 12 oz bottle of beer (355 mL), one 5 oz glass of wine (148 mL), or one 1 oz glass of hard liquor (44 mL). General instructions Take over-the-counter and prescription medicines only as told by your health care provider. If caffeine triggers episodes of PVC, do not eat, drink, or use anything with caffeine in it. Contact a health care provider if: You feel palpitations often. You have nausea and vomiting. Get help right away if: You have chest pain. You have trouble breathing. You start sweating for no reason. You become light-headed or you faint. These symptoms may be an emergency. Get help right away. Call 911. Do not wait to see if the symptoms will go away. Do not drive yourself to the hospital. This information is not intended to replace advice given to you by your health care provider. Make sure you discuss any questions you have with your health care provider. Document Revised: 07/12/2021 Document Reviewed: 07/12/2021 Elsevier Patient Education  2024 [employer].

## 2024-01-19 ENCOUNTER — Other Ambulatory Visit: Payer: Self-pay | Admitting: Internal Medicine

## 2024-01-19 DIAGNOSIS — E1149 Type 2 diabetes mellitus with other diabetic neurological complication: Secondary | ICD-10-CM

## 2024-01-19 DIAGNOSIS — N1831 Chronic kidney disease, stage 3a: Secondary | ICD-10-CM

## 2024-01-25 ENCOUNTER — Other Ambulatory Visit (HOSPITAL_COMMUNITY): Payer: Self-pay

## 2024-01-29 ENCOUNTER — Telehealth: Payer: Self-pay

## 2024-01-29 ENCOUNTER — Other Ambulatory Visit (HOSPITAL_COMMUNITY): Payer: Self-pay

## 2024-01-29 NOTE — Telephone Encounter (Signed)
 Spoke with his daughter about his medication. He has enough medication at the pharmacy for 6 months his daughter has been made aware and the medication is ready for pick up.

## 2024-01-29 NOTE — Telephone Encounter (Signed)
 Copied from CRM #[id number]. Topic: Clinical - Medication Question >> Jan 29, 2024 12:27 PM Sasha M wrote: Reason for CRM: Pt daughter called because refill request due to pt needing appt. Pt has appt on 03/08/2024 so daughter was wondering if he could get refill to carry him over until next appt or what would be the next best option. Please call Almarie at [phone] to advise

## 2024-02-02 NOTE — Telephone Encounter (Signed)
 I don't think this would be helpful and would likely be more harmful

## 2024-02-12 ENCOUNTER — Other Ambulatory Visit: Payer: Self-pay | Admitting: Internal Medicine

## 2024-02-12 DIAGNOSIS — I10 Essential (primary) hypertension: Secondary | ICD-10-CM

## 2024-02-12 DIAGNOSIS — I2510 Atherosclerotic heart disease of native coronary artery without angina pectoris: Secondary | ICD-10-CM

## 2024-02-15 ENCOUNTER — Other Ambulatory Visit: Payer: Self-pay | Admitting: Cardiology

## 2024-02-15 ENCOUNTER — Telehealth: Payer: Self-pay | Admitting: Internal Medicine

## 2024-02-15 ENCOUNTER — Other Ambulatory Visit (HOSPITAL_COMMUNITY): Payer: Self-pay

## 2024-02-15 ENCOUNTER — Other Ambulatory Visit: Payer: Self-pay

## 2024-02-15 MED ORDER — METOPROLOL TARTRATE 25 MG PO TABS
12.5000 mg | ORAL_TABLET | Freq: Two times a day (BID) | ORAL | 0 refills | Status: AC
  Filled 2024-02-15: qty 90, 90d supply, fill #0

## 2024-02-15 NOTE — Telephone Encounter (Unsigned)
 Copied from CRM #[id number]. Topic: Clinical - Medication Refill >> Feb 15, 2024  2:18 PM Sasha M wrote: Medication: metoprolol  tartrate (LOPRESSOR ) 25 MG tablet, nitroGLYCERIN  (NITROSTAT ) 0.4 MG SL tablet Would like to have this as a mail order not a pick up  Has the patient contacted their pharmacy? Yes (Agent: If no, request that the patient contact the pharmacy for the refill. If patient does not wish to contact the pharmacy document the reason why and proceed with request.) (Agent: If yes, when and what did the pharmacy advise?) No refills  This is the patient's preferred pharmacy:  [facility] [address] [place] [address] Phone: [phone] Fax: [phone]  Is this the correct pharmacy for this prescription? Yes If no, delete pharmacy and type the correct one.   Has the prescription been filled recently? No  Is the patient out of the medication? No  Has the patient been seen for an appointment in the last year OR does the patient have an upcoming appointment? Yes, next appt in January  Can we respond through MyChart? No, phone call preferred  Agent: Please be advised that Rx refills may take up to 3 business days. We ask that you follow-up with your pharmacy.

## 2024-02-16 ENCOUNTER — Other Ambulatory Visit: Payer: Self-pay

## 2024-02-16 MED ORDER — NITROGLYCERIN 0.4 MG SL SUBL
0.4000 mg | SUBLINGUAL_TABLET | SUBLINGUAL | 3 refills | Status: AC | PRN
  Filled 2024-02-16: qty 25, 7d supply, fill #0

## 2024-02-16 NOTE — Telephone Encounter (Signed)
 Appears Metoprolol  was sent to pharmacy yesterday

## 2024-02-22 ENCOUNTER — Other Ambulatory Visit (HOSPITAL_COMMUNITY): Payer: Self-pay

## 2024-03-06 ENCOUNTER — Other Ambulatory Visit: Payer: Self-pay | Admitting: Internal Medicine

## 2024-03-06 DIAGNOSIS — E785 Hyperlipidemia, unspecified: Secondary | ICD-10-CM

## 2024-03-06 DIAGNOSIS — I2510 Atherosclerotic heart disease of native coronary artery without angina pectoris: Secondary | ICD-10-CM

## 2024-03-07 ENCOUNTER — Other Ambulatory Visit: Payer: Self-pay

## 2024-03-08 ENCOUNTER — Other Ambulatory Visit: Payer: Self-pay

## 2024-03-08 ENCOUNTER — Encounter: Payer: Self-pay | Admitting: Internal Medicine

## 2024-03-08 ENCOUNTER — Other Ambulatory Visit (HOSPITAL_COMMUNITY): Payer: Self-pay

## 2024-03-08 ENCOUNTER — Ambulatory Visit: Admitting: Internal Medicine

## 2024-03-08 DIAGNOSIS — N1831 Chronic kidney disease, stage 3a: Secondary | ICD-10-CM | POA: Diagnosis not present

## 2024-03-08 DIAGNOSIS — E1149 Type 2 diabetes mellitus with other diabetic neurological complication: Secondary | ICD-10-CM

## 2024-03-08 DIAGNOSIS — E1122 Type 2 diabetes mellitus with diabetic chronic kidney disease: Secondary | ICD-10-CM | POA: Diagnosis not present

## 2024-03-08 DIAGNOSIS — B356 Tinea cruris: Secondary | ICD-10-CM | POA: Diagnosis not present

## 2024-03-08 MED ORDER — KETOCONAZOLE 2 % EX CREA
1.0000 | TOPICAL_CREAM | Freq: Two times a day (BID) | CUTANEOUS | 3 refills | Status: AC
  Filled 2024-03-08: qty 60, 30d supply, fill #0
  Filled 2024-04-01: qty 60, 30d supply, fill #1

## 2024-03-08 MED ORDER — EMPAGLIFLOZIN 10 MG PO TABS
10.0000 mg | ORAL_TABLET | Freq: Every day | ORAL | 1 refills | Status: AC
  Filled 2024-03-08 – 2024-03-22 (×2): qty 90, 90d supply, fill #0

## 2024-03-08 NOTE — Patient Instructions (Signed)
 Jock Itch Jock itch (tinea cruris) is an infection of the skin in the groin area that is caused by a fungus. Jock itch causes an itchy rash in the groin and upper thigh area. It usually goes away in 2-3 weeks with treatment. What are the causes? The fungus that causes jock itch may be spread by: Touching a fungal infection elsewhere on your body, such as athlete's foot, and then touching your groin area. Sharing towels or clothing, such as socks or shoes, with someone who has a fungal infection. What increases the risk? Jock itch is most common in men and adolescent boys. You are also more likely to develop the condition if you: Are in a hot, humid climate. Wear tight-fitting clothing or wet bathing suits for long periods of time. Play sports. Are overweight. Have diabetes. Have a weakened body defense system (immune system). Sweat a lot. What are the signs or symptoms? Symptoms of jock itch may include: A red, pink, or brown rash in the groin area. Blisters may be present. The rash may spread to the thighs, the opening between the buttocks (anus), and the buttocks. Dry and scaly skin on or around the rash. Itchiness. How is this diagnosed? In most cases, your health care provider can make the diagnosis by looking at your rash. In some cases, a sample of infected skin may be scraped off. This sample may be examined under a microscope (biopsy) or by trying to grow the fungus from the sample (culture). How is this treated? Treatment for this condition may include: Antifungal medicine to kill the fungus. This may be a skin cream, ointment, or powder, or it may be a medicine that you take by mouth (orally). Skin cream or ointment to reduce itching. Lifestyle changes, such as wearing looser clothing and caring for your skin. Follow these instructions at home: Skin care Apply skin creams, ointments, or powders exactly as told by your health care provider. Wear loose-fitting clothing that does  not rub against your groin area. Men should wear boxer shorts or loose-fitting underwear. Keep your groin area clean and dry. Change your underwear every day. Change out of wet bathing suits as soon as possible. After bathing, use a separate towel to gently dry your groin area thoroughly. Using a separate towel will help prevent spreading the infection to other parts of your body. Avoid hot baths and showers. Hot water can make itching worse. Do not scratch the affected area. General instructions Take and apply over-the-counter and prescription medicines only as told by your health care provider. Do not share towels, clothing, or personal items with other people. Wash your hands often with soap and water for at least 20 seconds, especially after touching your groin area. If soap and water are not available, use hand sanitizer. When at the gym: Always wear shoes, especially in the shower and around the swimming pool. Keep any cuts covered. Disinfect any mats or equipment before using them. Shower immediately after working out. Keep all follow-up visits. This is important. Contact a health care provider if: Your rash: Gets worse or does not get better after 2 weeks of treatment. Spreads. Returns after treatment is finished. You have any of the following: A fever. New or worsening redness, swelling, or pain around your rash. Fluid, blood, or pus coming from your rash. Summary Jock itch (tinea cruris) is a fungal infection of the skin in the groin area. The fungus can be spread by sharing clothing or by touching a fungus infection  elsewhere on your body and then touching your groin area. Treatment may include antifungal medicine and lifestyle changes, such as keeping the area clean and dry. This information is not intended to replace advice given to you by your health care provider. Make sure you discuss any questions you have with your health care provider. Document Revised: 05/01/2020  Document Reviewed: 05/01/2020 Elsevier Patient Education  2024 [employer].

## 2024-03-08 NOTE — Progress Notes (Signed)
 "  Subjective:  Patient ID: Manuel [name], male    DOB: 02/17/1929  Age: 89 y.o. MRN: [id number]  CC: Rash and Diabetes   HPI Manuel [name] presents for f/up   Discussed the use of AI scribe software for clinical note transcription with the patient, who gave verbal consent to proceed.  History of Present Illness Manuel [name] is a 89 year old male who presents with a recent fall and ongoing groin rash.  He experienced a fall in his apartment last Monday when his feet got ahead of him, resulting in a bruise on his buttocks. No pain is currently present, including no back, buttock, or hip pain. He uses a walker at night to go to the bathroom, which is a short distance from his bed, and continues to walk at least twice a day up and down his driveway.  He has a history of a rash in his groin area, which is not currently being treated. He uses a powder to keep the area clean and has not used the prescribed cream recently as it was used up.  No fever or chills are present, but he has some nasal drip, which he attributes to allergies.     Outpatient Medications Prior to Visit  Medication Sig Dispense Refill   aspirin EC 81 MG EC tablet Take 1 tablet (81 mg total) by mouth daily.     atorvastatin  (LIPITOR) 40 MG tablet Take 1 tablet (40 mg total) by mouth daily. 90 tablet 0   irbesartan  (AVAPRO ) 150 MG tablet Take 1 tablet (150 mg total) by mouth daily. 90 tablet 3   metFORMIN  (GLUCOPHAGE -XR) 500 MG 24 hr tablet Take 1 tablet (500 mg total) by mouth daily with breakfast. 90 tablet 1   metoprolol  tartrate (LOPRESSOR ) 25 MG tablet Take 0.5 tablets (12.5 mg total) by mouth 2 (two) times daily. 90 tablet 0   Multiple Vitamin (MULTIVITAMIN) tablet Take 1 tablet by mouth daily.     nitroGLYCERIN  (NITROSTAT ) 0.4 MG SL tablet Place 1 tablet (0.4 mg total) under the tongue every 5 (five) minutes as needed for chest pain. 25 tablet 3   omeprazole  (PRILOSEC) 40 MG capsule Take 1 capsule (40 mg total) by mouth  daily. 90 capsule 1   empagliflozin  (JARDIANCE ) 10 MG TABS tablet Take 1 tablet (10 mg total) by mouth daily before breakfast. 90 tablet 1   ketoconazole  (NIZORAL ) 2 % shampoo Apply 1 Application topically every other day as needed for irritation. 240 mL 1   No facility-administered medications prior to visit.    ROS Review of Systems  Constitutional:  Negative for appetite change, chills, diaphoresis, fatigue and fever.  HENT: Negative.    Eyes: Negative.   Respiratory:  Negative for cough, chest tightness and wheezing.   Cardiovascular:  Negative for chest pain, palpitations and leg swelling.  Gastrointestinal: Negative.  Negative for abdominal pain, constipation, diarrhea, nausea and vomiting.  Endocrine: Negative.   Genitourinary: Negative.  Negative for difficulty urinating, dysuria, scrotal swelling and testicular pain.  Musculoskeletal:  Positive for gait problem. Negative for arthralgias, joint swelling and myalgias.  Skin:  Positive for color change and rash.  Neurological:  Negative for dizziness, weakness, light-headedness and numbness.  Hematological:  Negative for adenopathy. Does not bruise/bleed easily.  Psychiatric/Behavioral:  Positive for confusion and decreased concentration.     Objective:  BP 130/72 (BP Location: Left Arm, Patient Position: Sitting, Cuff Size: Normal)   Pulse 75   Temp 98.1 F (36.7  C) (Oral)   Ht 5' 10 (1.778 m)   Wt 183 lb 3.2 oz (83.1 kg)   SpO2 98%   BMI 26.29 kg/m   BP Readings from Last 3 Encounters:  03/08/24 130/72  01/14/24 132/82  12/25/23 132/88    Wt Readings from Last 3 Encounters:  03/08/24 183 lb 3.2 oz (83.1 kg)  01/14/24 184 lb 9.6 oz (83.7 kg)  12/25/23 182 lb 9.6 oz (82.8 kg)    Physical Exam Vitals reviewed.  HENT:     Nose: Nose normal.     Mouth/Throat:     Mouth: Mucous membranes are moist.  Eyes:     General: No scleral icterus.    Conjunctiva/sclera: Conjunctivae normal.  Cardiovascular:     Rate  and Rhythm: Normal rate and regular rhythm.     Heart sounds: No murmur heard.    No friction rub. No gallop.  Pulmonary:     Effort: Pulmonary effort is normal.     Breath sounds: No stridor. No wheezing, rhonchi or rales.  Abdominal:     General: Abdomen is flat.     Palpations: There is no mass.     Tenderness: There is no abdominal tenderness. There is no guarding.     Hernia: No hernia is present. There is no hernia in the left inguinal area or right inguinal area.  Genitourinary:    Pubic Area: Rash present.     Penis: Normal.      Testes: Normal.     Epididymis:     Right: Normal.     Left: Normal.    Musculoskeletal:     Cervical back: Neck supple.  Lymphadenopathy:     Cervical: No cervical adenopathy.     Lower Body: No right inguinal adenopathy. No left inguinal adenopathy.  Skin:    Findings: Erythema and rash present.  Neurological:     General: No focal deficit present.     Mental Status: He is alert.  Psychiatric:        Mood and Affect: Mood normal.     Lab Results  Component Value Date   WBC 4.3 10/22/2023   HGB 13.4 10/22/2023   HCT 41.6 10/22/2023   PLT 178.0 10/22/2023   GLUCOSE 117 (H) 01/14/2024   CHOL 155 06/16/2023   TRIG 95.0 06/16/2023   HDL 56.70 06/16/2023   LDLCALC 79 06/16/2023   ALT 26 06/16/2023   AST 29 06/16/2023   NA 139 01/14/2024   K 4.6 01/14/2024   CL 100 01/14/2024   CREATININE 1.21 01/14/2024   BUN 17 01/14/2024   CO2 32 01/14/2024   TSH 2.75 01/14/2024   INR 1.2 (H) 06/16/2023   HGBA1C 6.7 (H) 01/14/2024   MICROALBUR 3.4 (H) 06/16/2023    DG Hips Bilat W or Wo Pelvis 3-4 Views Result Date: 09/07/2022 CLINICAL DATA:  Fall, hip pain . EXAM: DG HIP (WITH OR WITHOUT PELVIS) 3-4V BILAT COMPARISON:  None Available. FINDINGS: There is no evidence of hip fracture or dislocation. There is no evidence of arthropathy or other focal bone abnormality. Vascular calcifications noted IMPRESSION: 1. Negative. Electronically Signed    By: Dorethia Molt M.D.   On: 09/07/2022 20:15   DG Knee Complete 4 Views Right Result Date: 09/07/2022 CLINICAL DATA:  Fall, right knee pain EXAM: RIGHT KNEE - COMPLETE 4+ VIEW COMPARISON:  None Available. FINDINGS: Normal alignment. No acute fracture or dislocation. Mild medial compartment degenerative arthritis. Small right knee effusion. Vascular calcifications are noted. Mild  degenerative enthesopathy seen involving the insertion of the quadriceps tendon upon the patella. Mild prepatellar soft tissue swelling. IMPRESSION: 1. Mild medial compartment degenerative arthritis. 2. Small right knee effusion. Mild prepatellar soft tissue swelling. Electronically Signed   By: Dorethia Molt M.D.   On: 09/07/2022 20:14   DG Knee Complete 4 Views Left Result Date: 09/07/2022 CLINICAL DATA:  Fall EXAM: LEFT KNEE - COMPLETE 4+ VIEW COMPARISON:  None Available. FINDINGS: Normal alignment. No acute fracture or dislocation. Mild degenerative changes are noted within the medial compartment. Small left knee effusion. Vascular calcifications are noted. Degenerative enthesopathy is seen involving the insertion the quadriceps tendon upon the patella. IMPRESSION: 1. Mild degenerative change. Small left knee effusion. Electronically Signed   By: Dorethia Molt M.D.   On: 09/07/2022 20:13   DG Hand Complete Left Result Date: 09/07/2022 CLINICAL DATA:  Fall, left hand pain EXAM: LEFT HAND - COMPLETE 3+ VIEW COMPARISON:  None Available. FINDINGS: Normal alignment. No acute fracture or dislocation. Advanced degenerative changes are noted involving the interphalangeal joint of the thumb as well as the PIP and DIP joints of the second through fifth digits. Mild degenerative changes are seen within the carpus. Soft tissues are unremarkable. IMPRESSION: 1. Degenerative changes. No acute fracture or dislocation. Electronically Signed   By: Dorethia Molt M.D.   On: 09/07/2022 20:12   CT Head Wo Contrast Result Date:  09/07/2022 CLINICAL DATA:  Blunt facial trauma from fall EXAM: CT HEAD WITHOUT CONTRAST CT MAXILLOFACIAL WITHOUT CONTRAST CT CERVICAL SPINE WITHOUT CONTRAST TECHNIQUE: Multidetector CT imaging of the head, cervical spine, and maxillofacial structures were performed using the standard protocol without intravenous contrast. Multiplanar CT image reconstructions of the cervical spine and maxillofacial structures were also generated. RADIATION DOSE REDUCTION: This exam was performed according to the departmental dose-optimization program which includes automated exposure control, adjustment of the mA and/or kV according to patient size and/or use of iterative reconstruction technique. COMPARISON:  None Available. FINDINGS: CT HEAD FINDINGS Brain: No intracranial hemorrhage, mass effect, or evidence of acute infarct. No hydrocephalus. No extra-axial fluid collection. Generalized cerebral atrophy. Ill-defined hypoattenuation within the cerebral white matter is nonspecific but consistent with chronic small vessel ischemic disease. Chronic right cerebellar infarct. Prominent CSF space or arachnoid cyst in the anterior left temporal fossa. Vascular: No hyperdense vessel. Intracranial arterial calcification. Skull: No fracture or focal lesion. Left periorbital/forehead hematoma Other: None. CT MAXILLOFACIAL FINDINGS Osseous: No fracture or mandibular dislocation. No destructive process. Orbits: Postoperative changes both globes. Globes are intact. No postseptal hematoma. Sinuses: Mucosal thickening in the ethmoid air cells and left frontal sinus. Leftward nasal septal deviation. Soft tissues: Negative. CT CERVICAL SPINE FINDINGS Alignment: No evidence of traumatic malalignment. Skull base and vertebrae: No acute fracture. No primary bone lesion or focal pathologic process. Soft tissues and spinal canal: No prevertebral fluid or swelling. No visible canal hematoma. Disc levels: Multilevel advanced spondylosis, disc space height  loss, and degenerative endplate changes. Advanced facet arthropathy on the left at C3-C4 and C4-C5. Posterior disc osteophyte complexes cause up to mild effacement of thecal sac greatest at C5-C6. No severe spinal canal narrowing. Upper chest: No acute abnormality. Other: Carotid calcification. IMPRESSION: 1. No acute intracranial abnormality. 2. No calvarial or facial fracture. Large left forehead/periorbital hematoma. 3. No acute fracture in the cervical spine. Electronically Signed   By: Norman Gatlin M.D.   On: 09/07/2022 19:57   CT Maxillofacial WO CM Result Date: 09/07/2022 CLINICAL DATA:  Blunt facial trauma from fall  EXAM: CT HEAD WITHOUT CONTRAST CT MAXILLOFACIAL WITHOUT CONTRAST CT CERVICAL SPINE WITHOUT CONTRAST TECHNIQUE: Multidetector CT imaging of the head, cervical spine, and maxillofacial structures were performed using the standard protocol without intravenous contrast. Multiplanar CT image reconstructions of the cervical spine and maxillofacial structures were also generated. RADIATION DOSE REDUCTION: This exam was performed according to the departmental dose-optimization program which includes automated exposure control, adjustment of the mA and/or kV according to patient size and/or use of iterative reconstruction technique. COMPARISON:  None Available. FINDINGS: CT HEAD FINDINGS Brain: No intracranial hemorrhage, mass effect, or evidence of acute infarct. No hydrocephalus. No extra-axial fluid collection. Generalized cerebral atrophy. Ill-defined hypoattenuation within the cerebral white matter is nonspecific but consistent with chronic small vessel ischemic disease. Chronic right cerebellar infarct. Prominent CSF space or arachnoid cyst in the anterior left temporal fossa. Vascular: No hyperdense vessel. Intracranial arterial calcification. Skull: No fracture or focal lesion. Left periorbital/forehead hematoma Other: None. CT MAXILLOFACIAL FINDINGS Osseous: No fracture or mandibular  dislocation. No destructive process. Orbits: Postoperative changes both globes. Globes are intact. No postseptal hematoma. Sinuses: Mucosal thickening in the ethmoid air cells and left frontal sinus. Leftward nasal septal deviation. Soft tissues: Negative. CT CERVICAL SPINE FINDINGS Alignment: No evidence of traumatic malalignment. Skull base and vertebrae: No acute fracture. No primary bone lesion or focal pathologic process. Soft tissues and spinal canal: No prevertebral fluid or swelling. No visible canal hematoma. Disc levels: Multilevel advanced spondylosis, disc space height loss, and degenerative endplate changes. Advanced facet arthropathy on the left at C3-C4 and C4-C5. Posterior disc osteophyte complexes cause up to mild effacement of thecal sac greatest at C5-C6. No severe spinal canal narrowing. Upper chest: No acute abnormality. Other: Carotid calcification. IMPRESSION: 1. No acute intracranial abnormality. 2. No calvarial or facial fracture. Large left forehead/periorbital hematoma. 3. No acute fracture in the cervical spine. Electronically Signed   By: Norman Gatlin M.D.   On: 09/07/2022 19:57   CT Cervical Spine Wo Contrast Result Date: 09/07/2022 CLINICAL DATA:  Blunt facial trauma from fall EXAM: CT HEAD WITHOUT CONTRAST CT MAXILLOFACIAL WITHOUT CONTRAST CT CERVICAL SPINE WITHOUT CONTRAST TECHNIQUE: Multidetector CT imaging of the head, cervical spine, and maxillofacial structures were performed using the standard protocol without intravenous contrast. Multiplanar CT image reconstructions of the cervical spine and maxillofacial structures were also generated. RADIATION DOSE REDUCTION: This exam was performed according to the departmental dose-optimization program which includes automated exposure control, adjustment of the mA and/or kV according to patient size and/or use of iterative reconstruction technique. COMPARISON:  None Available. FINDINGS: CT HEAD FINDINGS Brain: No intracranial  hemorrhage, mass effect, or evidence of acute infarct. No hydrocephalus. No extra-axial fluid collection. Generalized cerebral atrophy. Ill-defined hypoattenuation within the cerebral white matter is nonspecific but consistent with chronic small vessel ischemic disease. Chronic right cerebellar infarct. Prominent CSF space or arachnoid cyst in the anterior left temporal fossa. Vascular: No hyperdense vessel. Intracranial arterial calcification. Skull: No fracture or focal lesion. Left periorbital/forehead hematoma Other: None. CT MAXILLOFACIAL FINDINGS Osseous: No fracture or mandibular dislocation. No destructive process. Orbits: Postoperative changes both globes. Globes are intact. No postseptal hematoma. Sinuses: Mucosal thickening in the ethmoid air cells and left frontal sinus. Leftward nasal septal deviation. Soft tissues: Negative. CT CERVICAL SPINE FINDINGS Alignment: No evidence of traumatic malalignment. Skull base and vertebrae: No acute fracture. No primary bone lesion or focal pathologic process. Soft tissues and spinal canal: No prevertebral fluid or swelling. No visible canal hematoma. Disc levels: Multilevel advanced spondylosis,  disc space height loss, and degenerative endplate changes. Advanced facet arthropathy on the left at C3-C4 and C4-C5. Posterior disc osteophyte complexes cause up to mild effacement of thecal sac greatest at C5-C6. No severe spinal canal narrowing. Upper chest: No acute abnormality. Other: Carotid calcification. IMPRESSION: 1. No acute intracranial abnormality. 2. No calvarial or facial fracture. Large left forehead/periorbital hematoma. 3. No acute fracture in the cervical spine. Electronically Signed   By: Norman Gatlin M.D.   On: 09/07/2022 19:57    Assessment & Plan:  Diabetes mellitus type 2 with neurological manifestations (HCC) -     Empagliflozin ; Take 1 tablet (10 mg total) by mouth daily before breakfast.  Dispense: 90 tablet; Refill: 1  Stage 3a chronic  kidney disease (HCC) -     Empagliflozin ; Take 1 tablet (10 mg total) by mouth daily before breakfast.  Dispense: 90 tablet; Refill: 1  Tinea cruris- Will change ketoconazole  to the cream. -     Ketoconazole ; Apply 1 Application topically 2 (two) times daily.  Dispense: 60 g; Refill: 3     Follow-up: Return in about 4 months (around 07/06/2024).  Debby Molt, MD "

## 2024-03-09 ENCOUNTER — Other Ambulatory Visit (HOSPITAL_COMMUNITY): Payer: Self-pay

## 2024-03-09 ENCOUNTER — Other Ambulatory Visit: Payer: Self-pay

## 2024-03-09 MED ORDER — ATORVASTATIN CALCIUM 40 MG PO TABS
40.0000 mg | ORAL_TABLET | Freq: Every day | ORAL | 0 refills | Status: AC
  Filled 2024-03-09: qty 90, 90d supply, fill #0

## 2024-03-21 ENCOUNTER — Ambulatory Visit: Admitting: Podiatry

## 2024-03-22 ENCOUNTER — Other Ambulatory Visit (HOSPITAL_COMMUNITY): Payer: Self-pay

## 2024-03-22 ENCOUNTER — Other Ambulatory Visit: Payer: Self-pay

## 2024-04-07 ENCOUNTER — Ambulatory Visit: Admitting: Podiatry

## 2024-06-13 ENCOUNTER — Ambulatory Visit: Admitting: Internal Medicine

## 2024-10-12 ENCOUNTER — Ambulatory Visit

## 2024-12-09 ENCOUNTER — Encounter (INDEPENDENT_AMBULATORY_CARE_PROVIDER_SITE_OTHER): Admitting: Ophthalmology
